# Patient Record
Sex: Female | Born: 1955 | Race: White | Hispanic: No | Marital: Married | Smoking: Current every day smoker
Health system: Southern US, Community
[De-identification: ages and names within clinical notes are randomized; demographics above are authoritative.]

## PROBLEM LIST (undated history)

## (undated) DIAGNOSIS — F32A Depression, unspecified: Secondary | ICD-10-CM

## (undated) DIAGNOSIS — M1990 Unspecified osteoarthritis, unspecified site: Secondary | ICD-10-CM

## (undated) DIAGNOSIS — L570 Actinic keratosis: Secondary | ICD-10-CM

## (undated) DIAGNOSIS — F419 Anxiety disorder, unspecified: Secondary | ICD-10-CM

## (undated) DIAGNOSIS — F329 Major depressive disorder, single episode, unspecified: Secondary | ICD-10-CM

## (undated) DIAGNOSIS — I10 Essential (primary) hypertension: Secondary | ICD-10-CM

## (undated) DIAGNOSIS — E785 Hyperlipidemia, unspecified: Secondary | ICD-10-CM

## (undated) DIAGNOSIS — N200 Calculus of kidney: Secondary | ICD-10-CM

## (undated) HISTORY — DX: Calculus of kidney: N20.0

## (undated) HISTORY — DX: Hyperlipidemia, unspecified: E78.5

## (undated) HISTORY — DX: Depression, unspecified: F32.A

## (undated) HISTORY — DX: Unspecified osteoarthritis, unspecified site: M19.90

## (undated) HISTORY — DX: Actinic keratosis: L57.0

## (undated) HISTORY — DX: Major depressive disorder, single episode, unspecified: F32.9

## (undated) HISTORY — PX: BREAST BIOPSY: SHX20

---
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
## 1988-01-13 HISTORY — PX: ABDOMINAL HYSTERECTOMY: SHX81

## 2004-04-11 ENCOUNTER — Ambulatory Visit: Payer: Self-pay

## 2004-04-12 ENCOUNTER — Emergency Department: Payer: Self-pay | Admitting: Emergency Medicine

## 2004-08-12 ENCOUNTER — Ambulatory Visit: Payer: Self-pay | Admitting: Endocrinology

## 2005-07-30 ENCOUNTER — Ambulatory Visit: Payer: Self-pay | Admitting: General Practice

## 2006-04-08 ENCOUNTER — Ambulatory Visit: Payer: Self-pay | Admitting: Gastroenterology

## 2006-08-03 ENCOUNTER — Ambulatory Visit: Payer: Self-pay | Admitting: Endocrinology

## 2006-12-08 ENCOUNTER — Ambulatory Visit: Payer: Self-pay | Admitting: Internal Medicine

## 2006-12-16 ENCOUNTER — Ambulatory Visit: Payer: Self-pay | Admitting: Internal Medicine

## 2007-04-19 ENCOUNTER — Ambulatory Visit: Payer: Self-pay | Admitting: Internal Medicine

## 2007-04-28 ENCOUNTER — Ambulatory Visit: Payer: Self-pay | Admitting: Family Medicine

## 2007-10-20 ENCOUNTER — Ambulatory Visit: Payer: Self-pay | Admitting: Internal Medicine

## 2007-11-20 ENCOUNTER — Emergency Department: Payer: Self-pay | Admitting: Internal Medicine

## 2008-11-08 ENCOUNTER — Ambulatory Visit: Payer: Self-pay | Admitting: Internal Medicine

## 2009-01-08 ENCOUNTER — Ambulatory Visit: Payer: Self-pay | Admitting: Obstetrics & Gynecology

## 2009-01-12 HISTORY — PX: BLADDER SURGERY: SHX569

## 2009-01-15 ENCOUNTER — Ambulatory Visit: Payer: Self-pay | Admitting: Obstetrics & Gynecology

## 2009-04-14 IMAGING — US US PELV - US TRANSVAGINAL
1 series · 17 of 25 positions shown · non-contrast
Comparison: none

REASON FOR EXAM: Follow-up cyst
COMMENTS:

[Series 1: us pelv - us transvaginal · 17 of 33 slices shown]
[im 1/33]
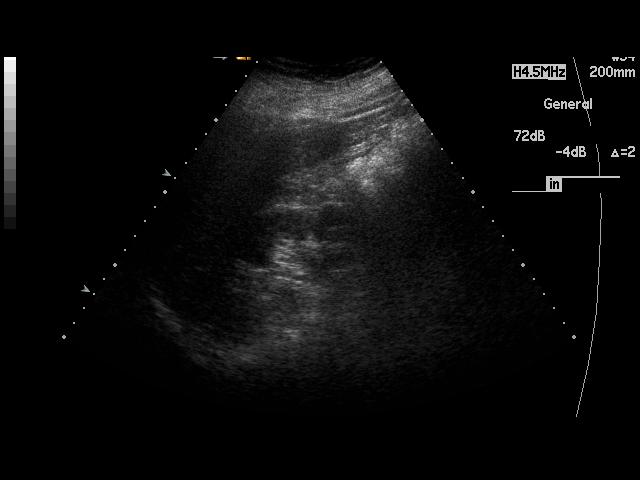
[im 3/33]
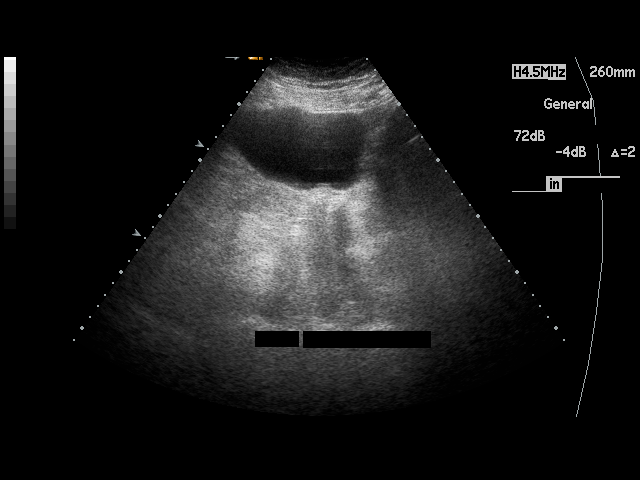
[im 5/33]
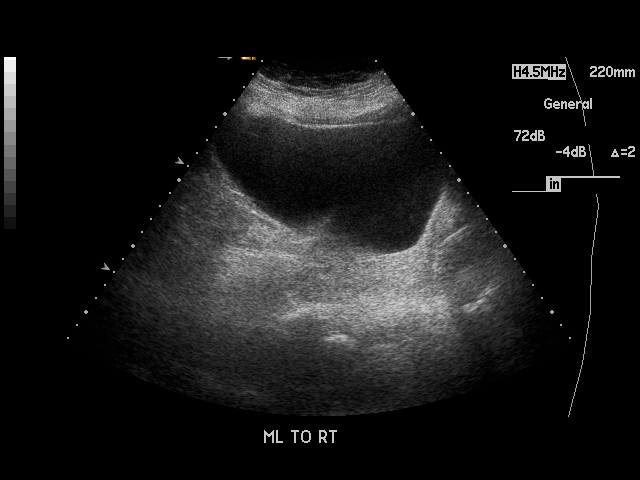
[im 7/33]
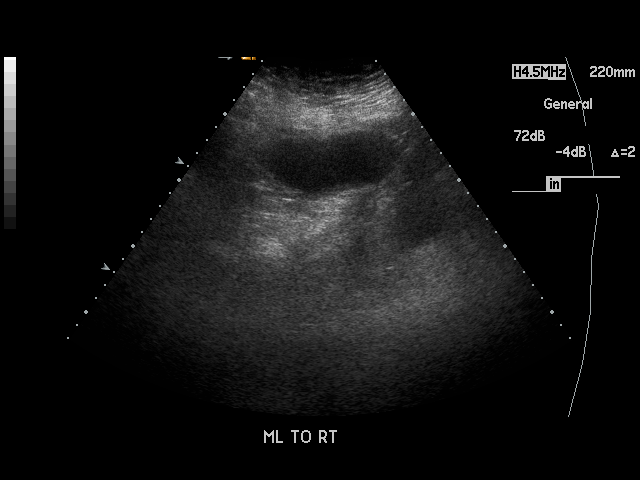
[im 9/33]
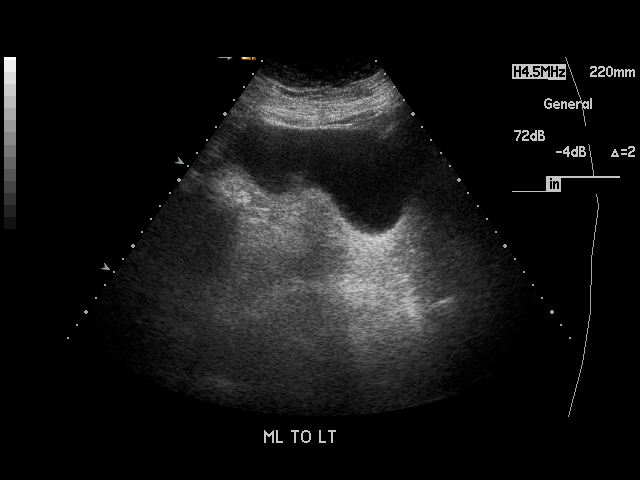
[im 11/33]
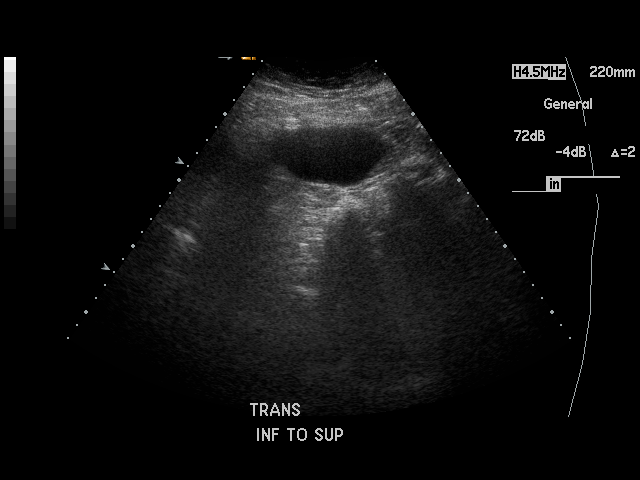
[im 13/33]
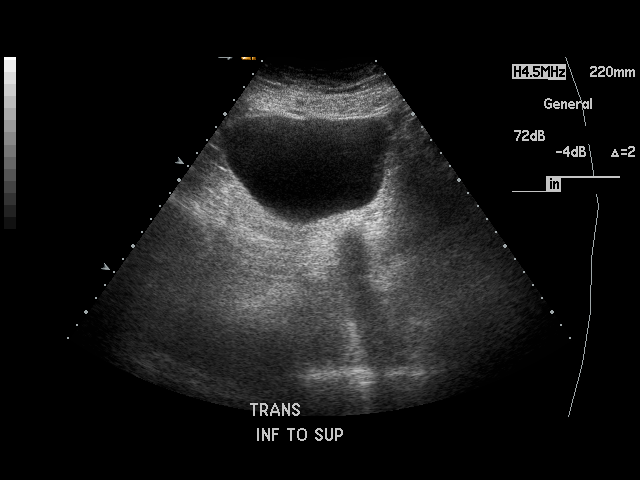
[im 15/33]
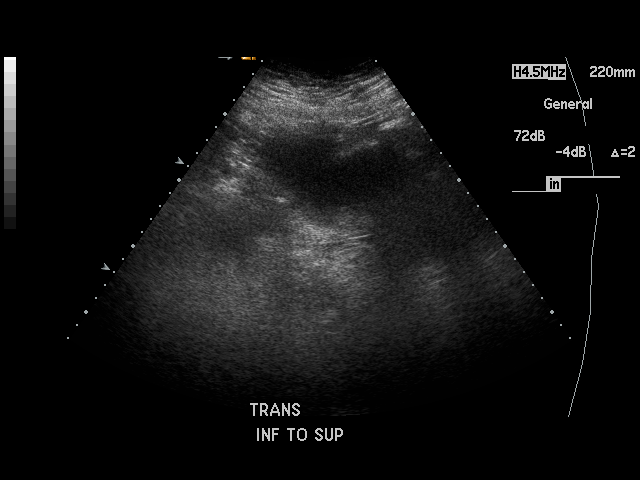
[im 17/33]
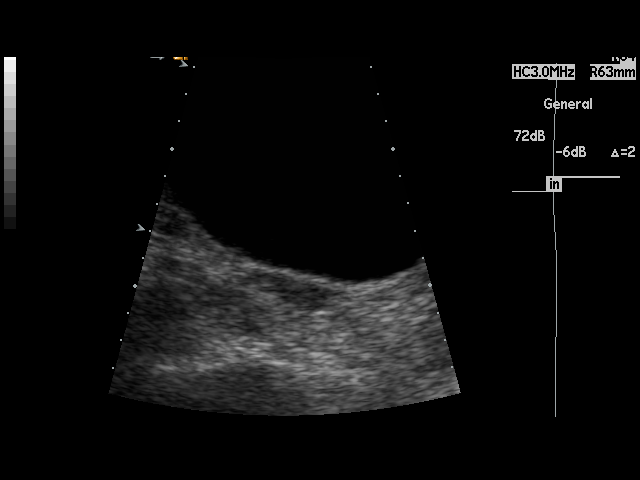
[im 18/33]
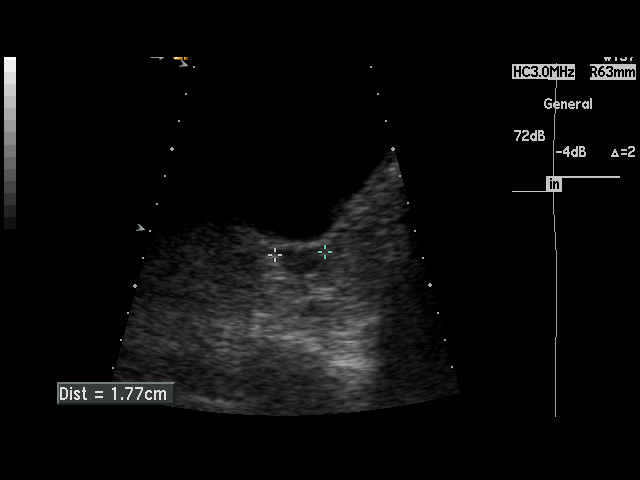
[im 21/33]
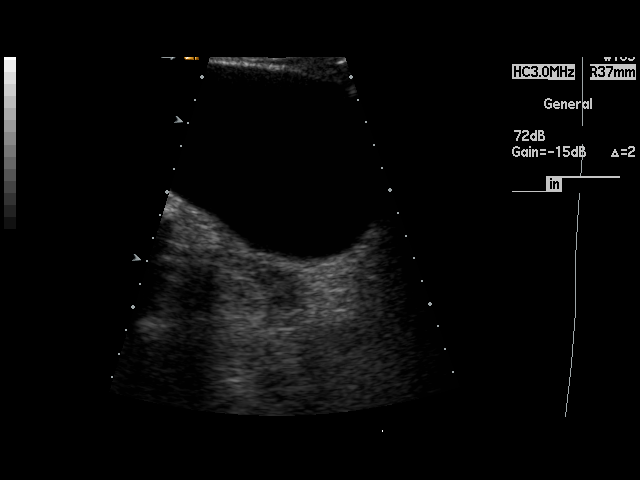
[im 22/33]
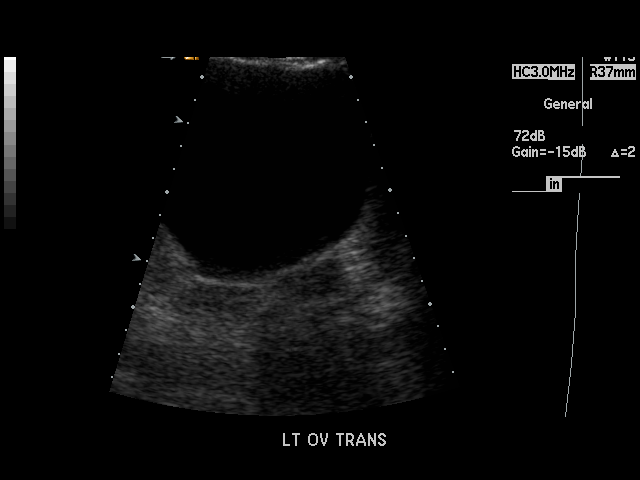
[im 25/33]
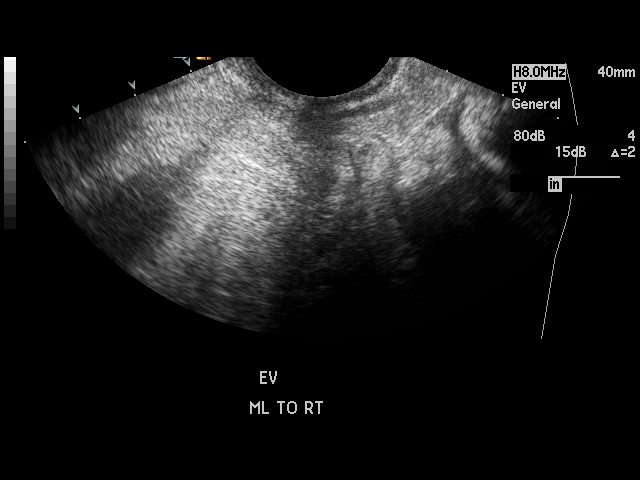
[im 26/33]
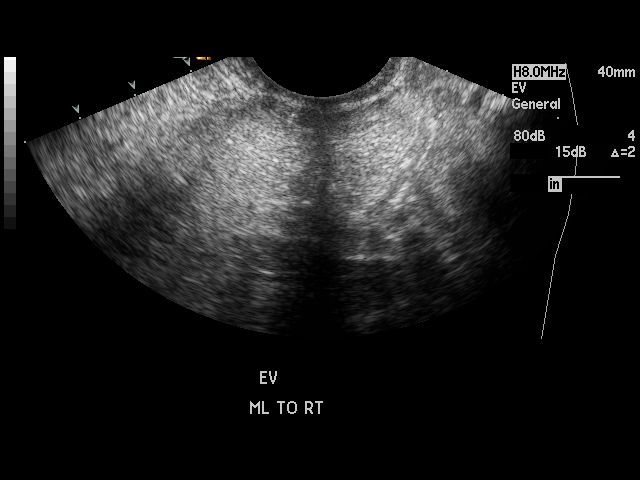
[im 29/33]
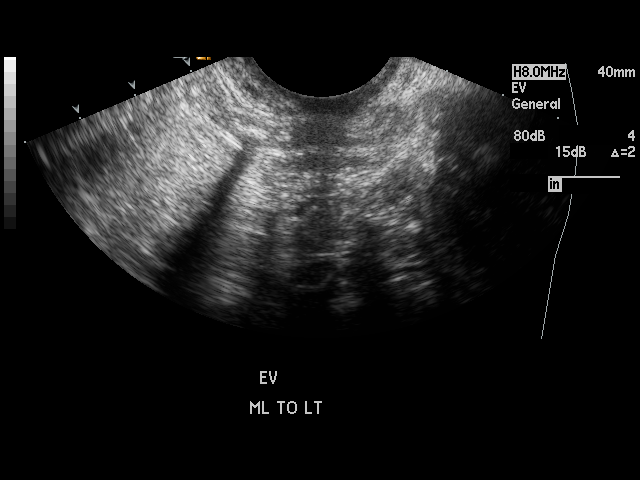
[im 30/33]
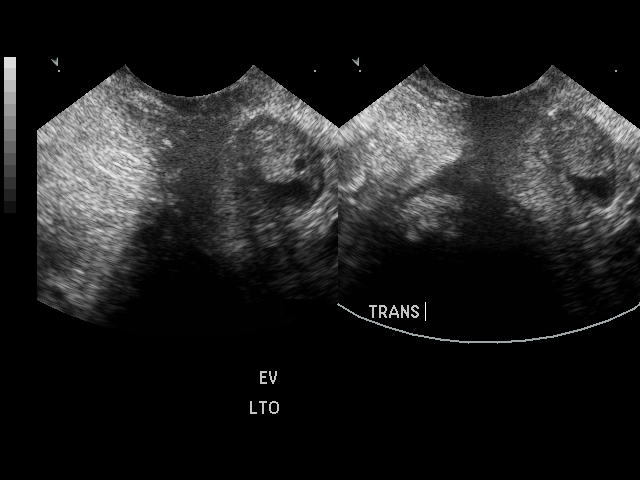
[im 33/33]
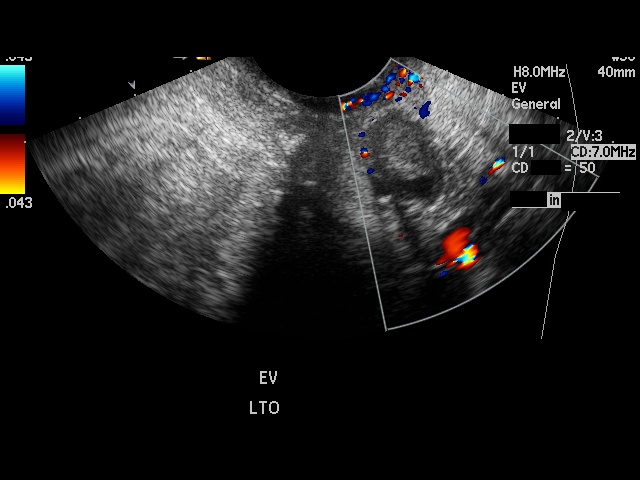

[17 of 25 positions shown; findings below may reference images not displayed]

PROCEDURE:     US  - US PELVIS MASS EXAM W/TRANSVAGI  - October 20, 2007  [DATE]

RESULT:     Transabdominal and endovaginal scanning is performed.

The patient is status post hysterectomy in 9959. The patient has undergone
previous ultrasound of the pelvis showing bilateral ovarian cysts.

On today's examination, the RIGHT ovary measures 2.2 x 1.4 x 1.8 cm while
the LEFT ovary measures 1.8 x 1.1 x 1.1 cm. The LEFT ovary contains a 3.6 x
3.8 x 3.0 mm cyst which is smaller than seen previously. There is no
hydronephrosis or abnormal fluid collection otherwise.
IMPRESSION: Tiny, LEFT ovarian cyst. This is continually decreasing in
size compared to the previous exam. The patient is status post hysterectomy.

## 2009-11-27 ENCOUNTER — Ambulatory Visit: Payer: Self-pay

## 2010-05-09 ENCOUNTER — Observation Stay (HOSPITAL_COMMUNITY)
Admission: RE | Admit: 2010-05-09 | Discharge: 2010-05-11 | Disposition: A | Discharge status: Home / Self Care | Payer: 59 | Source: Other Acute Inpatient Hospital | Attending: Internal Medicine | Admitting: Internal Medicine

## 2010-05-09 ENCOUNTER — Emergency Department: Payer: Self-pay | Admitting: Unknown Physician Specialty

## 2010-05-09 DIAGNOSIS — F3289 Other specified depressive episodes: Secondary | ICD-10-CM | POA: Insufficient documentation

## 2010-05-09 DIAGNOSIS — F17200 Nicotine dependence, unspecified, uncomplicated: Secondary | ICD-10-CM | POA: Insufficient documentation

## 2010-05-09 DIAGNOSIS — J309 Allergic rhinitis, unspecified: Secondary | ICD-10-CM | POA: Insufficient documentation

## 2010-05-09 DIAGNOSIS — N179 Acute kidney failure, unspecified: Secondary | ICD-10-CM | POA: Insufficient documentation

## 2010-05-09 DIAGNOSIS — E876 Hypokalemia: Secondary | ICD-10-CM | POA: Insufficient documentation

## 2010-05-09 DIAGNOSIS — J45909 Unspecified asthma, uncomplicated: Secondary | ICD-10-CM | POA: Insufficient documentation

## 2010-05-09 DIAGNOSIS — R509 Fever, unspecified: Secondary | ICD-10-CM | POA: Insufficient documentation

## 2010-05-09 DIAGNOSIS — K7689 Other specified diseases of liver: Secondary | ICD-10-CM | POA: Insufficient documentation

## 2010-05-09 DIAGNOSIS — N12 Tubulo-interstitial nephritis, not specified as acute or chronic: Principal | ICD-10-CM | POA: Insufficient documentation

## 2010-05-09 DIAGNOSIS — F329 Major depressive disorder, single episode, unspecified: Secondary | ICD-10-CM | POA: Insufficient documentation

## 2010-05-09 DIAGNOSIS — D72829 Elevated white blood cell count, unspecified: Secondary | ICD-10-CM | POA: Insufficient documentation

## 2010-05-09 DIAGNOSIS — D1803 Hemangioma of intra-abdominal structures: Secondary | ICD-10-CM | POA: Insufficient documentation

## 2010-05-09 DIAGNOSIS — Z79899 Other long term (current) drug therapy: Secondary | ICD-10-CM | POA: Insufficient documentation

## 2010-05-09 DIAGNOSIS — N3289 Other specified disorders of bladder: Secondary | ICD-10-CM | POA: Insufficient documentation

## 2010-05-09 DIAGNOSIS — I10 Essential (primary) hypertension: Secondary | ICD-10-CM | POA: Insufficient documentation

## 2010-05-09 LAB — URINALYSIS, ROUTINE W REFLEX MICROSCOPIC
Bilirubin Urine: NEGATIVE
Glucose, UA: NEGATIVE mg/dL
Protein, ur: 100 mg/dL — AB

## 2010-05-09 LAB — URINE MICROSCOPIC-ADD ON

## 2010-05-10 LAB — PROTIME-INR
INR: 1.19 (ref 0.00–1.49)
Prothrombin Time: 15.3 seconds — ABNORMAL HIGH (ref 11.6–15.2)

## 2010-05-10 LAB — COMPREHENSIVE METABOLIC PANEL
BUN: 12 mg/dL (ref 6–23)
Calcium: 8.6 mg/dL (ref 8.4–10.5)
Glucose, Bld: 145 mg/dL — ABNORMAL HIGH (ref 70–99)
Sodium: 138 mEq/L (ref 135–145)
Total Protein: 6.2 g/dL (ref 6.0–8.3)

## 2010-05-10 LAB — CBC
HCT: 34.7 % — ABNORMAL LOW (ref 36.0–46.0)
MCHC: 34 g/dL (ref 30.0–36.0)
MCV: 89.9 fL (ref 78.0–100.0)
RDW: 13.3 % (ref 11.5–15.5)

## 2010-05-10 LAB — MAGNESIUM: Magnesium: 2 mg/dL (ref 1.5–2.5)

## 2010-05-10 LAB — PROTEIN, URINE, RANDOM: Total Protein, Urine: 122 mg/dL

## 2010-05-10 NOTE — Consult Note (Signed)
NAMEURIEL, Amanda Mcgee                ACCOUNT NO.:  [id number]  MEDICAL RECORD NO.:  [id number]           PATIENT TYPE:  I  LOCATION:  1341                         FACILITY:  [facility]  PHYSICIAN:  Danae Chen, M.D.  DATE OF BIRTH:  1955-07-31  DATE OF CONSULTATION:  05/09/2010 DATE OF DISCHARGE:                                CONSULTATION   REASON FOR CONSULTATION:  Fever and right UPJ stone.  HISTORY:  The patient is a 55 year old female who started having chills and fever last Sunday.  That was associated with nausea, vomiting and mild diarrhea.  She was out of town and saw her primary care physician on Wednesday who thought that she had an episode of food poisoning and started her on Cipro and Flagyl.  However, she did not see any improvement and continued to have chills and fever and this morning she felt worse and went to the emergency room where a CT scan showed moderate right hydronephrosis  secondary to a 6 x 7 mm stone at the right UPJ.  She states that her temperature was up to 102.8.  She was seen at [facility] and she was transferred to [facility] because they do not have any urology coverage and I was asked to see her in consultation.  PAST MEDICAL HISTORY:  Positive for hypertension.  SOCIAL HISTORY:  She is married, has one child, has smoked 1/2 to 3/4th of a pack a day for 30 years and she drinks occasionally.  FAMILY HISTORY:  Her father died of lymphoma and her mother had recently diagnosed with a brain tumor.  PAST SURGICAL HISTORY:  She had hysterectomy, appendectomy, and sling procedure.  MEDICATIONS:  Bupropion 150 mg every 12 hours, calcium carbonate 600 mg a day, cetirizine 10 mg a day, citalopram 40 mg a day, Colace 100 mg 3 times a day, HCTZ 12.5 mg a day, ibuprofen 200 mg 2 tablets every 4 hours, metronidazole 500 mg twice a day, Cipro 500 mg, ondansetron 8 mg every 8 hours for nausea,  Singulair 10 mg a day, Tylenol Extra  Strength 500 mg 2 tablets every 4 hours, vitamin B12 1000 mcg a day, vitamin C 1000 mcg a day, vitamin E 1000 International Units daily.  ALLERGIES:  She is allergic to CODEINE, BENADRYL, PREDNISONE, PENICILLIN, LEVOFLOXACIN.  REVIEW OF SYSTEMS:  As noted in the HPI and everything else is negative.  PHYSICAL EXAMINATION:  GENERAL:  This is a well-developed 55 year old female who states that she feels terrible.  She has nausea, vomiting and right-sided abdominal pain and she has a poor appetite.  She has chills and fever.  She is oriented to time, place and person. VITAL SIGNS:  Her blood pressure at this time is 138/72, her temperature is down to 99.3, pulse 85, respirations 20. SKIN:  Warm and dry. HEENT:  Her head is normal.  She has pink conjunctivae.  Ears, nose and throat are within normal limits. NECK:  Supple.  She has no cervical adenopathy.  No thyromegaly. CHEST:  Symmetrical. LUNGS:  Her lungs are fully expanded and clear to percussion and auscultation.  HEART:  Regular rhythm. ABDOMEN:  Soft and she has some moderate right CVA tenderness.  Kidneys are not palpable.  She has no hepatomegaly, no splenomegaly.  Bladder is not distended.  She has no inguinal adenopathy.  No inguinal hernia. GENITALIA:  She has normal female genitalia.  She is status post hysterectomy.  RADIOLOGIC STUDIES:  I independently reviewed the CT scan and the findings are as noted above.  Her hemoglobin at [facility] was 11.4, hematocrit 34, WBC 7.8.  Sodium 137, potassium low at 2.9, BUN 15, creatinine 1.22.  IMPRESSION:  Right ureteropelvic junction stone, moderate right hydronephrosis, urinary tract infection.  Urine culture is pending.  SUGGESTIONS:  Continue IV Rocephin.  SUGGESTIONS:  Cystoscopy and stent placement to relieve obstruction. The procedure, the risks and benefits were explained to the patient.  The risks include but are not limited to hemorrhage, infection  and inability to insert the stent because of the stone and ureteral injury.  She understands and is agreeable.  If the stent cannot be placed, she will then need a right percutaneous nephrostomy for kidney drainage.     Danae Chen, M.D.     MN/MEDQ  D:  05/09/2010  T:  05/10/2010  Job:  [id number]  Electronically Signed by Lindaann Slough M.D. on 05/10/2010 11:17:18 AM

## 2010-05-10 NOTE — H&P (Signed)
NAME:  Amanda Mcgee, Amanda Mcgee                ACCOUNT NO.:  [id number]  MEDICAL RECORD NO.:  [id number]           PATIENT TYPE:  I  LOCATION:  1341                         FACILITY:  [facility]  PHYSICIAN:  Baltazar Najjar, MD     DATE OF BIRTH:  Jul 17, 1955  DATE OF ADMISSION:  05/09/2010 DATE OF DISCHARGE:                             HISTORY & PHYSICAL   This is a transfer from [facility].  PRIMARY CARE PHYSICIAN:  Beverely Risen, MD.  CODE STATUS:  Full code.  CHIEF COMPLAINT:  Nausea, vomiting, abdominal pain on and off since Sunday.  HISTORY OF PRESENT ILLNESS:  Amanda Mcgee is a 55 year old female with past medical history of hypertension, tobacco abuse, and obesity, was transferred to [place] from [facility] for urological evaluation and workup.  Apparently, the patient was found to have right ureteropelvic junction 60 x 7 mm stone with moderate right hydronephrosis and given the fact that they do not have urologycoverage, the patient was transferred to [facility]. As per the patient, since Sunday, she has been experiencing nausea, vomiting, fevers, and chills.  She was having also abdominal discomfort on both flanks. The patient was also complaining of diarrhea.  At that time, she was seen by her primary care physician who prescribed her Ciprofloxacin and Flagyl and stated that she probably have food poisoning or some kind of stomach infection.  The patient was unable to tolerate ciprofloxacin. She stated that she has allergic reaction to it manifested by swelling and rash, so she discontinued the ciprofloxacin, and her condition continued to worse.  As per her since Tuesday, she have not had any further diarrhea and no vomiting, however she continued to be nauseated and just feeling weak and having fevers and rigors, so she came back to the  ER.  In the ER as part of her workup, her urine test showed evidence of urinary tract infection.  She  also had a CT scan of the abdomen and pelvis that showed a right hydronephrosis with right ureteropelvic junction stone that was 6  x 7 mm.  Her right kidney size was also smaller than the left.  She has right atrophic kidney.  As per history and physical from [place], they called the nephrology service over there, who recommended transferring her to [facility].  The patient was also found to be febrile at [facility] with a temperature was 100.7.  She was given Rocephin, Zofran, and Dilaudid in the ER. The patient denies any dysuria or hematuria; however, she stated that she gets flank pains on and off.  PAST MEDICAL HISTORY: 1. Hypertension. 2. Obesity. 3. Tobacco abuse.  SOCIAL HISTORY:  She is married, lives with her husband, unemployed. Smokes cigarettes, however she stopped smoking since Sunday drinks about three times a week.  She used to smoke about half a pack a day for about 30 years.  FAMILY HISTORY:  Significant for hypertension and hyperlipidemia.  ALLERGIES: 1. PENICILLIN. 2. CODEINE. 3. BENADRYL. 4. CIPROFLOXACIN. 5. PREDNISONE.  REVIEW OF SYSTEMS:  CHEST:  No shortness of breath or cough. CARDIOVASCULAR: No chest pain or  palpitation.  ABDOMEN:  Significant for flanks pain, on and off nausea. Vomiting and diarrhea which was resolved.  GU: No dysuria, no increasing urinary frequency.  No hematuria.  CONSTITUTIONAL:  Significant for fever or rigors.  HOME MEDICATIONS: 1. Citalopram 40 mg daily at bedtime. 2. Hydrochlorothiazide 12.5 mg daily. 3. Ventolin inhaler 2 puffs q.4 h p.r.n. 4. Cetirizine 10 mg p.o. daily. 5. Singulair 10 mg p.o. q.p.m. 6. Ibuprofen 200 mg q.4 h p.r.n. 7. Tylenol Extra Strength 500 mg 2 tablets q. 4 h p.r.n. 8. Wellbutrin SR 150 mg p.o. 3 times a day.  PHYSICAL EXAM:  VITAL SIGNS:  Blood pressure 138/72, pulse 85, temperature 99.3, O2 sat 93% on room air.  She is alert, in mild distress. NECK:  Supple.  No JVD. CHEST:  Clear to  auscultation bilaterally. CARDIOVASCULAR:  S1, S2 regular rhythm and rate. ABDOMEN: Soft and nontender.  She does have costovertebral angle tenderness.  However, there is no frontal tenderness.  Bowel sounds positive. EXTREMITIES:  No pedal edema. NEUROLOGIC:  She is alert, oriented x3 and moves all her extremities.  LABORATORY DATA:  As per [facility] history and physical, white blood count of 7.8, hemoglobin 11.4, hematocrit 34, platelet 204. Sodium 137, potassium 2.9, bicarb 30, BUN 15, creatinine 1.22, glucose 125.  Urinalysis showed 11 WBCs, 1+ bacteria, trace leukocyte esterase, 100 mg of protein, and 3+ blood.  CT/RADIOLOGY/IMAGING:  As per [facility] records,  CT scan of the abdomen and pelvis showed moderate hydronephrosis and hydroureter on the right secondary to 6 x 7 mm stone in the right ureteropelvic junction, no other stones.  Abdominal sonogram showed mild splenomegaly, mild hydronephrosis involving the right kidney, echogenic nodules within the liver, possibly hemangioma; however, they recommended further imaging such as CT and MRI.  ASSESSMENT/PLAN: 1. Urinary tract infection/pyelonephritis. 2. Right hydronephrosis/right ureteropelvic junction stone. 3. Atrophic right kidney. 4. Hypokalemia. 5. Liver hemangioma, nodule. 6. Possibly acute kidney injury, unknown baseline creatinine. 7. Hypertension. 8. Tobacco abuse.  PLAN: 1. We will send blood and urine culture and start her on Rocephin 1     gram IV daily.  Follow cultures and tailor antibiotic accordingly. 2. Dr. Brunilda Payor from Urology consulted who will see the patient in the     morning. 3. IV fluid hydration with normal saline at 20 mEq of KCl, also will     give KCl 10 mEq IV x4 runs, check magnesium level in the a.m. 4. Will keep her on clear liquid diet for now and prescribed Zofran     p.r.n. for nausea. 5. Nicotine patch, smoking cessation counseling.  The patient was     counseled on  smoking cessation by me, and she will also be     considered for social work resources for outpatient smoking     cessation programs. 6. I will hold the hydrochlorothiazide given the fact that her     potassium is currently low and she looked dry, I will switch that     with Norvasc. 7. Her urine showed significant proteinuria, repeat urinalysis in the     ER, and if she still has significant proteinuria, I will recommend     to check spot urine for protein, creatinine to quantify her     proteinuria. 8. DVT and GI prophylaxis. 9. The patient is a full code.          ______________________________ Baltazar Najjar, MD     SA/MEDQ  D:  05/09/2010  T:  05/09/2010  Job:  [id number]  Electronically Signed by Hannah Beat MD on 05/10/2010 09:01:21 PM

## 2010-05-11 LAB — BASIC METABOLIC PANEL
BUN: 13 mg/dL (ref 6–23)
Creatinine, Ser: 0.97 mg/dL (ref 0.4–1.2)
GFR calc non Af Amer: 60 mL/min — ABNORMAL LOW (ref >60)

## 2010-05-11 LAB — URINE CULTURE
Colony Count: NO GROWTH
Culture  Setup Time: 201204280111
Special Requests: NEGATIVE

## 2010-05-11 LAB — CBC
MCH: 30.5 pg (ref 26.0–34.0)
MCV: 90.9 fL (ref 78.0–100.0)
Platelets: 181 10*3/uL (ref 150–400)
RDW: 13.6 % (ref 11.5–15.5)

## 2010-05-11 LAB — MAGNESIUM: Magnesium: 2 mg/dL (ref 1.5–2.5)

## 2010-05-12 LAB — URINE CULTURE

## 2010-05-16 LAB — CULTURE, BLOOD (ROUTINE X 2): Culture  Setup Time: 201204280527

## 2010-05-16 NOTE — Op Note (Signed)
  NAME:  Amanda Mcgee, Amanda Mcgee                ACCOUNT NO.:  [id number]  MEDICAL RECORD NO.:  [id number]           PATIENT TYPE:  I  LOCATION:  1341                         FACILITY:  [facility]  PHYSICIAN:  Danae Chen, M.D.  DATE OF BIRTH:  [birth year]  DATE OF PROCEDURE:  05/10/2010 DATE OF DISCHARGE:                              OPERATIVE REPORT   PREOPERATIVE DIAGNOSES:  Right ureteropelvic junction stone, urinary tract infection, right hydronephrosis.  POSTOPERATIVE DIAGNOSES:  Right ureteropelvic junction stone, urinary tract infection, right hydronephrosis.  PROCEDURE DONE:  Cystoscopy, right retrograde pyelogram and insertion of double-J stent.  SURGEON:  Danae Chen, M.D.  ANESTHESIA:  General.  INDICATIONS:  The patient is a 55 year old female who has been having chills, fever, nausea, vomiting and diarrhea for the past 6 days.  She went to see her primary care physician on Wednesday who started her on Cipro and Flagyl.  She did not see any improvement and went to the emergency room at [facility] yesterday.  She claims that her temperature was up to 102.8.  A CT scan of the abdomen and pelvis showed a 6 x 7 mm stone at the right UPJ with moderate hydronephrosis.  They do not have a urologist on call in [place] and she was then transferred to [facility] for further treatment.  She is still complaining of chills, fever and right-sided abdominal pain.  She is scheduled for cystoscopy, right retrograde pyelogram and double-J stent.  PROCEDURE IN DETAIL:  The patient was identified by her wrist band and proper time-out was taken.  Under general anesthesia she was prepped and draped and placed in the dorsal lithotomy position.  A panendoscope was inserted in the bladder. The urethra is normal.  The bladder mucosa is normal.  There is no stone or tumor in the bladder.  Retrograde pyelogram.  A #6-French open-ended catheter was passed through the cystoscope and  through the ureteral orifice.  Contrast was then injected through the open-ended catheter.  The distal and mid ureter appear normal.  There is a filling defect at the right UPJ and the renal pelvis and calyces are moderately dilated.  A sensor wire was passed through the open-ended catheter up to the renal pelvis.  The open-ended catheter was then removed.  A #6-French - 26 double-J stent was passed over the guidewire with the proximal curl and the distal curl respectively in the renal pelvis and in the bladder. Purulent urine drained from the stent.  Then the urine was sent for culture and sensitivity.  The guidewire was removed.  The bladder was emptied and the cystoscope removed.  The patient tolerated the procedure well and left the OR in satisfactory condition to the Post Anesthesia Care Unit.     Danae Chen, M.D.     MN/MEDQ  D:  05/10/2010  T:  05/10/2010  Job:  [id number]  Electronically Signed by Lindaann Slough M.D. on 05/16/2010 03:15:50 PM

## 2010-05-19 NOTE — Discharge Summary (Signed)
NAME:  Amanda Mcgee, Amanda Mcgee                ACCOUNT NO.:  [id number]  MEDICAL RECORD NO.:  [id number]           PATIENT TYPE:  I  LOCATION:  1341                         FACILITY:  [facility]  PHYSICIAN:  Rosanna Randy, MDDATE OF BIRTH:  [birth year]  DATE OF ADMISSION:  05/09/2010 DATE OF DISCHARGE:  05/11/2010                              DISCHARGE SUMMARY   PRIMARY CARE PHYSICIAN:  Dr.  Beverely Risen  DISCHARGE DIAGNOSES: 1. Urinary tract infection/pyelonephritis. 2. Right hydronephrosis/right ureteropelvic junction stone. 3. Hypokalemia. 4. Acute kidney injury. 5. Hypertension. 6. Depression. 7. Tobacco abuse. 8. Liver hemangioma/echogenic nodules within the liver. 9. Allergic rhinitis. 10.Asthma. 11.Bladder spasm, new symptom after JJ stent was placed and     cystourethrogram was performed. 12.Leukocytosis.  DISCHARGE MEDICATIONS: 1. Bactrim DS 1 tablet by mouth twice a day and ordered for the     patient to take it for 8 more days. 2. Hyoscyamine 0.125 mg by mouth every 4 hours as needed for bladder     spasm. 3. Potassium 20 mEq to take by mouth daily. 4. Vicodin 5/500 one to two tablets by mouth every 6 hours as needed     for severe pain.  No relieve by plain Tylenol. 5. Tylenol Extra Strength 500 mg 2 tablets by mouth q.6 h as needed     for fever/pain. 6. Cetrizine 10 mg 1 tablet by mouth every morning. 7. Citalopram 40 mg 1 tablet by mouth daily at bedtime. 8. HCTZ 12.5 mg 1 tablet by mouth daily. 9. Montelukast 10 mg 1 tablet by mouth every morning. 10.Albuterol inhaler 2 puffs every 4 hours as needed to be inhaled for     shortness of breath/chest tightness. 11.Wellbutrin SR 1 tablet by mouth 3 times a day.  The patient was     advised to stop using ibuprofen.  DISPOSITION AND FOLLOWUP:  The patient has been discharged in stable and improved condition, currently afebrile for the last 24 hours, not complaining of any nausea and vomiting which just mild discomfort  when she urinates, described per her as bladder spasm and with instructions to arrange followup appointment with primary care physician in 7 to 10 days and to call Dr. Brunilda Payor, urology office, in order to arrange followup with him for ESL of stone as an outpatient when the urine is sterile. The patient was also instructed to follow a heart healthy diet and to stop smoking.  PROCEDURES PERFORMED DURING THIS HOSPITALIZATION:  Cystoscopy with right retrograde pyelogram insertion of double-J stent by Dr. Brunilda Payor.  No other procedures were performed during this hospitalization.  Urology, specifically Dr. Brunilda Payor, was consulted.  HISTORY OF PRESENT ILLNESS:  For full details, please refer to Dr. Midge Aver HPI done on May 09, 2010, but in summary, this is a 55 year old female who started having chills and fever last Sunday prior to admission associated with nausea, vomiting and diarrhea.  She was out of town and saw her primary care physician on Wednesday who felt that she had an episode of food poisoning and started the patient on Cipro andFlagyl.  However, she did not see any improvement and  continued to have chills and fever.  The patient developed allergic reaction to ciprofloxacin and was continued just on metronidazole.  Due to failed improvement in her symptoms, the patient went to the emergency department at [facility] where a CT scan showed a moderate right hydronephrosis secondary to a 6 x 7 mm stone at the right UPJ.  She stated that her temperature was up to 103 and at that moment, the patient was transferred to [facility] because they did not have any Urology coverage.  The patient was started on Rocephin and a urology consultation was made.  PHYSICAL EXAMINATION:  VITAL SIGNS:  Physical exam demonstrated a blood pressure of 138/72, heart rate 85, temperature 99.3, oxygen saturation 93% on room air. GENERAL:  The patient was alert, in mild distress secondary to pain. NECK:   Supple.  No JVD. CHEST:  Clear to auscultation bilaterally. CARDIOVASCULAR:  S1, S2 with regular rate and rhythm. ABDOMEN:  Soft, nontender.  She had positive CVA angle tenderness, right more than left.  Positive bowel sounds. EXTREMITIES:  No pedal edema. NEUROLOGIC EXAM:  Cranial nerves II through XII intact.  Able to move 4 extremities without difficulty.  Muscle strength 5/5.  The patient follows commands appropriately and has normal sensation.  LABORATORY DATA:  Pertinent laboratory data on admission after reviewing [facility] history and physical, the patient had white blood cell of 7.8, hemoglobin 11.4, hematocrit 34, platelets 204.  Sodium 137, potassium 2.9, bicarb 30, BUN 15, creatinine 1.22, glucose 125. Urinalysis showed 11 white blood cells, 1+ bacteria, trace leukocyte esterase, 100 mg of protein, and 3+ blood.  CT radiology imaging reports; as per [facility] records, a CT scan of abdomen and pelvis showed moderate hydronephrosis and hydroureter on the right secondary to 6 x 7 mm stone in the right ureteropelvic junction.  No other stones were appreciated on the CT scan.  Abdominal sonogram showed mild splenomegaly, mild hydronephrosis involving the right kidney, echogenic nodules within the liver, possible hemangioma, however, radiologist has recommended further imaging studies such an MRI of the liver for correlation and better view.  Other pertinent laboratory data performed during this hospitalization includes PT 15.3, INR 1.19, PTT 37, random urine creatinine 82, total protein random in urine 122.  The patient had a CBC on May 10, 2010, that demonstrated white blood cells of 12.9 with a hemoglobin of 11.8, platelets 188.  Comprehensive metabolic panel done on May 10, 2010, demonstrated a sodium of 138, potassium 3.5, chloride 98, bicarb 29, blood sugar 145, BUN 12, creatinine 1.19.  This is after the patient received fluid resuscitation.   Alkaline phosphatase was 156, AST was 22, ALT 35, albumin 2.6, total protein 6.2, magnesium was 2.0.  Blood culture, no growth up to the moment of discharge.  Urine culture taken at the moment that the stent was placed was still pending, at the moment of this dictation, and is going to be followed by Dr. Brunilda Payor, urologist.  HOSPITAL COURSE BY PROBLEM.: 1. Pyelonephritis.  Final urine culture still pending but the patient     has been afebrile for the last 24 hours, able to tolerate full diet     and is not complaining of any nausea, vomiting or abdominal pain at     this point.  Plan is to start the patient on Bactrim twice a day     and she is going to follow with primary care physician and also     with Dr. Brunilda Payor as an  outpatient.  The patient is in charge of     arranging these  arranging these followup visits. 2. Right hydronephrosis secondary to a 6 x 7 mm stone localized in the     right UPJ.  The patient is status post JJ stent placement by Dr.     Brunilda Payor and is going to follow with him as an outpatient.  She is     going to continue taking antibiotic as instructed and has been     instructed to keep herself hydrated. 3. Bladder spasms that she has developed after the procedure and stent     placement.  She has been started on p.r.n. hyoscyamine and she is     going to follow with Urology. 4. Hypertension, stable.  Plan is to continue HCTZ and to follow with     her primary care physician.  The patient instructed to follow a low-     sodium diet. 5. Depression which is stable.  Plan is to continue using her Celexa. 6. Hypokalemia.  After repletion, the patient remained borderline low     potassium on the lab work prior to discharge, she had been started     on daily potassium supplementation.  This hypokalemia is most     likely secondary to the use of HCTZ. 7. Tobacco abuse.  She received counseling in order to stop smoking.     The patient is planning to quit.  She will need  further assistance     and support per primary care physician.  Throughout this     hospitalization, she was using nicotine patch. 8. Allergic rhinitis.  Plan is to continue using cetirizine. 9. History of asthma.  Plan is to continue using p.r.n. albuterol and     also Singulair. 10.Abnormal findings on her liver by ultrasound/CT done at [facility] that most likely is secondary to a hemangioma.  The     patient with completely normal LFTs throughout this     hospitalization.  We have recommended that the patient has an MRI     as an outpatient that can be arranged/scheduled by her primary care     physician. 11.Acute kidney injury secondary to infection and also obstruction     which completely resolved after fluid resuscitation, antibiotics,     and stent placement.  The rest of the patient's medical problems continued to be stable throughout this hospitalization and no changes have been made to her medication regimen.  DISCHARGE PHYSICAL EXAMINATION:  VITAL SIGNS:  At discharge, the patient's temperature was 98.6, heart rate 80, respiratory rate 18, blood pressure 124/70, oxygen saturation 96% on room air. GENERAL:  In general, the patient was in no acute distress. RESPIRATORY SYSTEM:  Clear to auscultation bilaterally. HEART:  Regular rate and rhythm without murmurs, gallops or rubs. ABDOMEN:  Soft, nontender, nondistended with positive bowel sounds. EXTREMITIES:  Without any edema. NEUROLOGIC EXAM:  Nonfocal.  DISCHARGE LABORATORY DATA:  At discharge, lab work demonstrated a phosphorous of 2.5, magnesium 2.0.  Sodium 134, potassium 3.4, chloride 102, bicarb 27, BUN 13, creatinine 0.97, blood sugar 116.  White blood cells 10.7, hemoglobin 10.5, platelets 181.     Rosanna Randy, MD     CEM/MEDQ  D:  05/11/2010  T:  05/12/2010  Job:  [id number]  cc:   Lindaann Slough, M.D. Fax: [phone]  Dr. Beverely Risen  Electronically Signed by Vassie Loll MD on  05/19/2010 10:09:18 PM

## 2010-06-12 ENCOUNTER — Ambulatory Visit (HOSPITAL_COMMUNITY)
Admission: RE | Admit: 2010-06-12 | Discharge: 2010-06-12 | Disposition: A | Discharge status: Home / Self Care | Payer: BC Managed Care – PPO | Source: Ambulatory Visit | Attending: Urology | Admitting: Urology

## 2010-06-12 DIAGNOSIS — F329 Major depressive disorder, single episode, unspecified: Secondary | ICD-10-CM | POA: Insufficient documentation

## 2010-06-12 DIAGNOSIS — F3289 Other specified depressive episodes: Secondary | ICD-10-CM | POA: Insufficient documentation

## 2010-06-12 DIAGNOSIS — F17200 Nicotine dependence, unspecified, uncomplicated: Secondary | ICD-10-CM | POA: Insufficient documentation

## 2010-06-12 DIAGNOSIS — N201 Calculus of ureter: Secondary | ICD-10-CM | POA: Insufficient documentation

## 2010-12-29 ENCOUNTER — Ambulatory Visit: Payer: Self-pay

## 2011-01-13 DIAGNOSIS — N200 Calculus of kidney: Secondary | ICD-10-CM

## 2011-01-13 HISTORY — DX: Calculus of kidney: N20.0

## 2011-06-13 HISTORY — PX: LITHOTRIPSY: SUR834

## 2011-08-06 ENCOUNTER — Ambulatory Visit: Payer: Self-pay | Admitting: Internal Medicine

## 2011-08-06 ENCOUNTER — Observation Stay: Payer: Self-pay | Admitting: Urology

## 2011-08-06 LAB — COMPREHENSIVE METABOLIC PANEL
Anion Gap: 10 (ref 7–16)
BUN: 11 mg/dL (ref 7–18)
Bilirubin,Total: 1 mg/dL (ref 0.2–1.0)
Chloride: 101 mmol/L (ref 98–107)
Co2: 26 mmol/L (ref 21–32)
Creatinine: 0.94 mg/dL (ref 0.60–1.30)
EGFR (African American): >60
Potassium: 3.5 mmol/L (ref 3.5–5.1)
SGOT(AST): 12 U/L — ABNORMAL LOW (ref 15–37)
Total Protein: 7.6 g/dL (ref 6.4–8.2)

## 2011-08-06 LAB — URINALYSIS, COMPLETE
Nitrite: NEGATIVE
Ph: 7 (ref 4.5–8.0)
Squamous Epithelial: 1

## 2011-08-06 LAB — CBC
MCV: 90 fL (ref 80–100)
Platelet: 309 10*3/uL (ref 150–440)
RDW: 13.5 % (ref 11.5–14.5)
WBC: 20.4 10*3/uL — ABNORMAL HIGH (ref 3.6–11.0)

## 2011-08-08 ENCOUNTER — Ambulatory Visit: Payer: Self-pay | Admitting: Urology

## 2011-08-08 LAB — CBC
HCT: 30.3 % — ABNORMAL LOW (ref 35.0–47.0)
MCH: 31 pg (ref 26.0–34.0)
MCHC: 34.7 g/dL (ref 32.0–36.0)
Platelet: 270 10*3/uL (ref 150–440)
WBC: 7.8 10*3/uL (ref 3.6–11.0)

## 2011-08-08 LAB — URINE CULTURE

## 2011-08-12 LAB — CULTURE, BLOOD (SINGLE)

## 2011-08-13 ENCOUNTER — Ambulatory Visit: Payer: Self-pay | Admitting: Urology

## 2011-08-31 ENCOUNTER — Ambulatory Visit: Payer: Self-pay | Admitting: Urology

## 2011-12-30 ENCOUNTER — Ambulatory Visit: Payer: Self-pay

## 2012-01-01 ENCOUNTER — Ambulatory Visit: Payer: Self-pay

## 2012-01-18 ENCOUNTER — Ambulatory Visit: Payer: Self-pay | Admitting: General Surgery

## 2012-07-26 ENCOUNTER — Ambulatory Visit: Payer: Self-pay | Admitting: General Surgery

## 2012-07-27 ENCOUNTER — Encounter: Payer: Self-pay | Admitting: General Surgery

## 2012-07-28 ENCOUNTER — Encounter: Payer: Self-pay | Admitting: *Deleted

## 2012-08-08 ENCOUNTER — Encounter: Payer: Self-pay | Admitting: General Surgery

## 2012-08-08 ENCOUNTER — Ambulatory Visit (INDEPENDENT_AMBULATORY_CARE_PROVIDER_SITE_OTHER): Payer: BC Managed Care – PPO | Admitting: General Surgery

## 2012-08-08 VITALS — BP 152/70 | HR 84 | Resp 12 | Ht 65.0 in | Wt 181.0 lb

## 2012-08-08 DIAGNOSIS — R928 Other abnormal and inconclusive findings on diagnostic imaging of breast: Secondary | ICD-10-CM

## 2012-08-08 NOTE — Patient Instructions (Signed)
Patient to return as needed. 

## 2012-08-08 NOTE — Progress Notes (Signed)
Patient ID: Amanda Mcgee, female   DOB: 12/22/1955, 57 y.o.   MRN: [id number]  Chief Complaint  Patient presents with  . Other    mammogram    HPI Amanda Mcgee is a 57 y.o. female who presents for a breast evaluation. The most recent mammogram was done on 07/26/12 cat 2 on the left breast. Patient does perform regular self breast checks and gets regular mammograms done.    HPI  Past Medical History  Diagnosis Date  . Arthritis   . Depression   . Hyperlipidemia   . Kidney stones 2013    Past Surgical History  Procedure Laterality Date  . Abdominal hysterectomy  1990  . Bladder surgery  2011  . Lithotripsy  06/2011  . Appendectomy  1969    Family History  Problem Relation Age of Onset  . Breast cancer Mother 36    Social History History  Substance Use Topics  . Smoking status: Current Every Day Smoker -- 0.50 packs/day for 30 years    Types: Cigarettes  . Smokeless tobacco: Never Used  . Alcohol Use: Yes    Allergies  Allergen Reactions  . Benadryl (Diphenhydramine Hcl) Swelling  . Codeine Swelling  . Penicillins     Upset stomach     Current Outpatient Prescriptions  Medication Sig Dispense Refill  . atorvastatin (LIPITOR) 10 MG tablet Take 1 tablet by mouth daily.      . Black Cohosh 160 MG CAPS Take 1 capsule by mouth daily.      Marland Kitchen buPROPion (WELLBUTRIN SR) 200 MG 12 hr tablet Take 1 tablet by mouth 2 (two) times daily.      . Calcium Carbonate-Vitamin D (CALCIUM + D PO) Take 1 tablet by mouth daily.      . citalopram (CELEXA) 40 MG tablet Take 1 tablet by mouth daily.      Marland Kitchen levocetirizine (XYZAL) 5 MG tablet Take 1 tablet by mouth daily.      Marland Kitchen PREVIDENT 5000 SENSITIVE 1.1-5 % PSTE Place 1 spray into the nose daily.      Bernadette Hoit Sodium (STOOL SOFTENER & LAXATIVE PO) Take 1 tablet by mouth 2 (two) times daily.       No current facility-administered medications for this visit.    Review of Systems Review of Systems  Constitutional:  Negative.   Respiratory: Negative.   Cardiovascular: Negative.     Blood pressure 152/70, pulse 84, resp. rate 12, height 5\' 5"  (1.651 m), weight 181 lb (82.101 kg), SpO2 97.00%.  Physical Exam Physical Exam  Constitutional: She is oriented to person, place, and time. She appears well-developed and well-nourished.  Cardiovascular: Normal rate, regular rhythm and normal heart sounds.   Pulmonary/Chest: She has rhonchi in the right lower field and the left lower field. Right breast exhibits no inverted nipple, no mass, no nipple discharge, no skin change and no tenderness. Left breast exhibits no inverted nipple, no mass, no nipple discharge, no skin change and no tenderness.  Course rhonchi were noted on the left, which cleared with deep inspiration. Mild prolongation of expiratory phase was noted. Resting room air saturation 97%.  Lymphadenopathy:    She has no cervical adenopathy.    She has no axillary adenopathy.  Neurological: She is alert and oriented to person, place, and time.  Skin: Skin is warm and dry.    Data Reviewed July 26, 2012 left breast mammogram showed the previously identified microcalcifications removed. BI-RAD-2.  Left breast biopsy dated January 18, 2012 showed benign breast tissue with focal fibrocystic changes and microcalcifications. Acute ductal inflammation was identified. No evidence of malignancy.  Assessment    Benign breast exam.     Plan    The patient should resume annual bilateral screening mammograms in December 2013 with her primary care provider.  The importance of smoking cessation was reviewed.        Amanda Mcgee 08/09/2012, 6:18 AM

## 2012-08-09 ENCOUNTER — Encounter: Payer: Self-pay | Admitting: General Surgery

## 2012-08-09 DIAGNOSIS — R928 Other abnormal and inconclusive findings on diagnostic imaging of breast: Secondary | ICD-10-CM | POA: Insufficient documentation

## 2012-08-16 ENCOUNTER — Encounter: Payer: Self-pay | Admitting: General Surgery

## 2012-12-30 ENCOUNTER — Ambulatory Visit: Payer: Self-pay

## 2013-07-13 IMAGING — CR DG OUTSIDE FILMS BODY
1 series · 1 of 1 positions shown · non-contrast
Comparison: none

[CC]
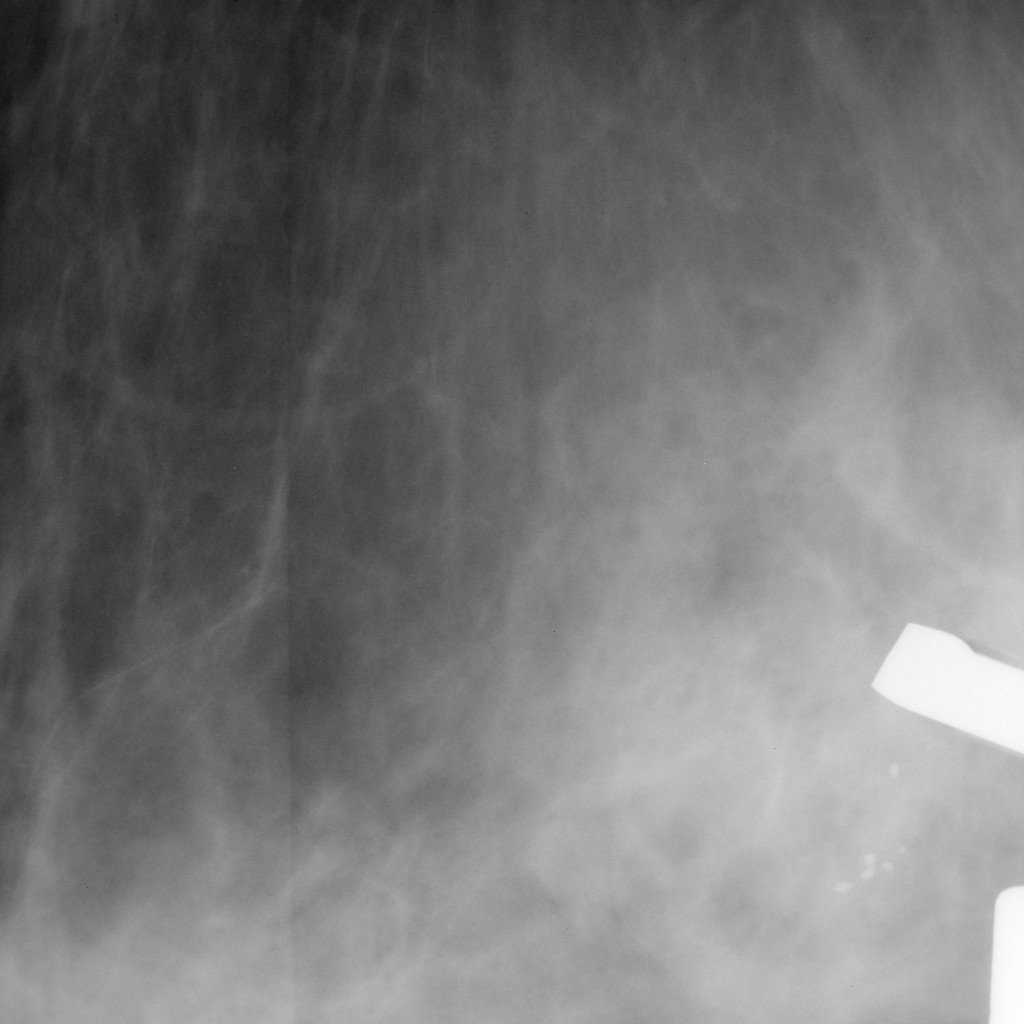

[1 of 1 positions shown; findings below may reference images not displayed]

Canned report from images found in remote index.

Refer to host system for actual result text.

## 2013-11-13 ENCOUNTER — Encounter: Payer: Self-pay | Admitting: General Surgery

## 2014-05-01 NOTE — H&P (Signed)
PATIENT NAME:  Amanda Mcgee, Amanda Mcgee MR#:  [id number] DATE OF BIRTH:  1955-01-31  DATE OF ADMISSION:  08/06/2011  CHIEF COMPLAINT: Flank pain.   HISTORY OF PRESENT ILLNESS: Amanda Mcgee is a 59 year old white female with a four month history of right flank pain. Pain became severe in the last week and she had a CT scan today indicating obstructed right collecting system with a distal stone. She was treated with a course of Macrodantin early in June followed by a course of Septra. She is currently on a course of Macrodantin. She presumably had UTIs in the past couple of months but rather it turns out to be a stone based upon the CT findings. She was seen in the ER earlier today and found to have probable urinary sepsis with a white cell count of 20,400 and fever.   ALLERGIES: The patient is allergic to Benadryl, penicillin, Claritin, and codeine.   CURRENT MEDICATIONS:  1. Cetirizine. 2. Bupropion. 3. Citalopram. 4. Atorvastatin. 5. Vitamins.   PAST SURGICAL HISTORY:  1. Appendectomy in 1968. 2. Hysterectomy in 1989.  3. Pubovaginal sling in 2011. 4. Cysto stent placement followed by lithotripsy April 2012.   SOCIAL HISTORY: The patient smokes a pack a day, has a greater than 30-pack-year history. She denied alcohol use.   FAMILY HISTORY: Remarkable for hypertension.   PAST AND CURRENT MEDICAL CONDITIONS:  1. Hypercholesterolemia.  2. Depression.  3. Allergic rhinitis.   REVIEW OF SYSTEMS: The patient denied chest pain, shortness of breath, diabetes, stroke, or coronary artery disease.   PHYSICAL EXAMINATION:   GENERAL: Well nourished white female in some distress due to pain.   HEENT: Sclerae were clear. Pupils are equal, round, reactive to light and accommodation. Extraocular movements intact.  NECK: Supple. No palpable cervical adenopathy.   LUNGS: Clear to auscultation.   CARDIOVASCULAR: Regular rhythm and rate without audible murmurs.   ABDOMEN: Soft, nontender abdomen.    GU/RECTAL: Deferred.   NEUROMUSCULAR: Nonfocal. The patient was alert and oriented x3.   IMPRESSION: Right ureterolithiasis with obstruction and sepsis.   PLAN:  1. Cysto with stent placement. 2. IV antibiotics.   ____________________________ Otelia Limes. Yves Dill, MD [email or web address] D: 08/06/2011 20:32:29 ET T: 08/07/2011 05:55:54 ET JOB#: [id number]  cc: Otelia Limes. Yves Dill, MD, <Dictator> Royston Cowper MD ELECTRONICALLY SIGNED 08/07/2011 8:39

## 2014-05-01 NOTE — Op Note (Signed)
PATIENT NAME:  Amanda, Mcgee MR#:  [id number] DATE OF BIRTH:  12/05/1955  DATE OF PROCEDURE:  08/06/2011  PREOPERATIVE DIAGNOSES:  1. Right ureterolithiasis.  2. Urinary tract infection.  3. Right hydronephrosis.   POSTOPERATIVE DIAGNOSES:  1. Right ureterolithiasis.  2. Urinary tract infection.  3. Right hydronephrosis.   PROCEDURES:  1. Cystoscopy with right stent placement.  2. Fluoroscopy.   SURGEON: Maryan Puls, M.D.   ANESTHETIST: Lucilla Edin, MD  ANESTHESIA: General.   INDICATIONS: See the dictated history and physical. After informed consent, the patient requests the above procedure.   OPERATIVE SUMMARY: After adequate general anesthesia had been obtained, the patient was placed into the dorsal lithotomy position and the perineum was prepped and draped in the usual fashion. Fluoroscopy was performed and stone was identified in the distal ureter. At this point, the 36 French cystoscope was coupled with the camera and then advanced into the bladder. The bladder was thoroughly inspected. Both ureteral orifices were identified. No bladder tumors were identified. At this point, a 0.035 Glidewire was advanced into the right orifice. There was some resistance at the location of the stone but I was able to pass the wire beyond the stone and curl it into the renal pelvis. A 6 x 24 cm double pigtail stent was then advanced over the guidewire and positioned in the ureter under fluoroscopic guidance. The guidewire was then removed taking care to leave the stent in position. The bladder was then drained and the scope was removed. 10 mL of viscous Xylocaine was instilled within the urethra and the bladder. A B and O suppository was placed. The procedure was then terminated and the patient was transferred to the recovery room in stable condition. ____________________________ Otelia Limes. Yves Dill, MD [email or web address]:slb D: 08/06/2011 22:20:11 ET T: 08/07/2011 10:34:27 ET JOB#: [id number]  cc: Otelia Limes.  Yves Dill, MD, <Dictator> Royston Cowper MD ELECTRONICALLY SIGNED 08/10/2011 7:28

## 2014-05-01 NOTE — H&P (Signed)
PATIENT NAME:  Amanda Mcgee, Amanda Mcgee MR#:  [id number] DATE OF BIRTH:  11-Jan-1956  DATE OF ADMISSION:  08/31/2011  CHIEF COMPLAINT: Urinary stent.   HISTORY OF PRESENT ILLNESS: Amanda Mcgee is a 59 year old white female with right ureterolithiasis who underwent stent placement in July and subsequently had lithotripsy 08/01. She was seen in the office 08/08 and was found to be stone free. She now comes in for cystoscopy with stent retrieval.   ALLERGIES: Patient is allergic to Benadryl, penicillin, Claritin and codeine.   CURRENT MEDICATIONS:  1. Cetirizine. 2. Bupropion. 3. Citalopram.   4. Atorvastatin.  5. Vitamins.  PAST SURGICAL HISTORY:  1. Appendectomy in 1968.  2. Hysterectomy in 1989. 3. Pubovaginal sling 2011.  4. Stent placement with lithotripsy 2012.   SOCIAL HISTORY: She smokes a pack a day and has a greater than 30 pack year history. She denied alcohol use.   FAMILY HISTORY: Remarkable for hypertension.   PAST AND CURRENT MEDICAL CONDITIONS: 1. Hypercholesterolemia.  2. Depression. 3. Allergic rhinitis.  REVIEW OF SYSTEMS: Patient denied chest pain, shortness of breath, diabetes, stroke or coronary artery disease.   PHYSICAL EXAMINATION: GENERAL: Well nourished white female in no acute distress.  HEENT: Sclera were clear. Pupils equally round, reactive to light and accommodation. Extraocular movements are intact.   NECK: Supple. No palpable cervical adenopathy.  LUNGS: Clear to auscultation.   CARDIOVASCULAR: Regular rhythm and rate without audible murmurs.  ABDOMEN: Soft, nontender abdomen.   GENITOURINARY/RECTAL: Deferred.  NEUROMUSCULAR: Patient alert and oriented x3.   IMPRESSION: Right ureterolithiasis status post stent placement and lithotripsy.   PLAN: Cystoscopy with stent retrieval.   ____________________________ Otelia Limes. Yves Dill, MD mrw:cms D: 08/25/2011 18:03:00 ET T: 08/26/2011 06:04:20 ET JOB#: [id number]  cc: Otelia Limes. Yves Dill, MD,  <Dictator> Royston Cowper MD ELECTRONICALLY SIGNED 08/26/2011 12:47

## 2014-05-01 NOTE — Op Note (Signed)
PATIENT NAME:  Amanda Mcgee, Amanda Mcgee MR#:  [id number] DATE OF BIRTH:  06/01/1955  DATE OF PROCEDURE:  08/31/2011  PREOPERATIVE DIAGNOSIS: Ureterolithiasis and stent.   POSTOPERATIVE DIAGNOSIS: Ureterolithiasis and stent.   PROCEDURE: Cystoscopy with stent retrieval.   SURGEON: Maryan Puls, MD  ANESTHETIST: Lucilla Edin, MD  ANESTHESIA: General.   INDICATIONS: See the dictated history and physical. After informed consent, the patient requested the above procedure.   OPERATIVE SUMMARY: After adequate general anesthesia had been obtained, the patient was placed into the dorsal lithotomy position and the perineum was prepped and draped in the usual fashion. The 21 French cystoscope was coupled with the camera and then placed into the bladder. Bladder was inspected. No lesions were identified. Stent was then identified and engaged with the alligator forceps and removed. 10 mL of viscous Xylocaine was then instilled within the urethra and the bladder. The procedure was terminated. The patient was then transferred to the recovery room in stable condition. ____________________________ Otelia Limes. Yves Dill, MD [email or web address]:slb D: 08/31/2011 12:53:47 ET     T: 08/31/2011 13:22:30 ET         JOB#: [id number] cc: Otelia Limes. Yves Dill, MD, <Dictator> Royston Cowper MD ELECTRONICALLY SIGNED 08/31/2011 16:59

## 2014-10-02 ENCOUNTER — Observation Stay
Admission: EM | Admit: 2014-10-02 | Discharge: 2014-10-03 | Disposition: A | Discharge status: Home / Self Care | Payer: No Typology Code available for payment source | Attending: Internal Medicine | Admitting: Internal Medicine

## 2014-10-02 ENCOUNTER — Emergency Department: Payer: No Typology Code available for payment source

## 2014-10-02 DIAGNOSIS — Z9889 Other specified postprocedural states: Secondary | ICD-10-CM | POA: Insufficient documentation

## 2014-10-02 DIAGNOSIS — I10 Essential (primary) hypertension: Secondary | ICD-10-CM | POA: Diagnosis not present

## 2014-10-02 DIAGNOSIS — R51 Headache: Secondary | ICD-10-CM | POA: Insufficient documentation

## 2014-10-02 DIAGNOSIS — Z7982 Long term (current) use of aspirin: Secondary | ICD-10-CM | POA: Insufficient documentation

## 2014-10-02 DIAGNOSIS — R0602 Shortness of breath: Secondary | ICD-10-CM | POA: Insufficient documentation

## 2014-10-02 DIAGNOSIS — R110 Nausea: Secondary | ICD-10-CM | POA: Diagnosis present

## 2014-10-02 DIAGNOSIS — Z90710 Acquired absence of both cervix and uterus: Secondary | ICD-10-CM | POA: Diagnosis not present

## 2014-10-02 DIAGNOSIS — Z79899 Other long term (current) drug therapy: Secondary | ICD-10-CM | POA: Diagnosis not present

## 2014-10-02 DIAGNOSIS — Z87442 Personal history of urinary calculi: Secondary | ICD-10-CM | POA: Diagnosis not present

## 2014-10-02 DIAGNOSIS — F17210 Nicotine dependence, cigarettes, uncomplicated: Secondary | ICD-10-CM | POA: Insufficient documentation

## 2014-10-02 DIAGNOSIS — F32A Depression, unspecified: Secondary | ICD-10-CM | POA: Diagnosis present

## 2014-10-02 DIAGNOSIS — R739 Hyperglycemia, unspecified: Secondary | ICD-10-CM | POA: Diagnosis present

## 2014-10-02 DIAGNOSIS — Z888 Allergy status to other drugs, medicaments and biological substances status: Secondary | ICD-10-CM | POA: Diagnosis not present

## 2014-10-02 DIAGNOSIS — R079 Chest pain, unspecified: Principal | ICD-10-CM | POA: Insufficient documentation

## 2014-10-02 DIAGNOSIS — R61 Generalized hyperhidrosis: Secondary | ICD-10-CM | POA: Diagnosis present

## 2014-10-02 DIAGNOSIS — F329 Major depressive disorder, single episode, unspecified: Secondary | ICD-10-CM | POA: Diagnosis not present

## 2014-10-02 DIAGNOSIS — Z7951 Long term (current) use of inhaled steroids: Secondary | ICD-10-CM | POA: Diagnosis not present

## 2014-10-02 DIAGNOSIS — Z880 Allergy status to penicillin: Secondary | ICD-10-CM | POA: Insufficient documentation

## 2014-10-02 DIAGNOSIS — F419 Anxiety disorder, unspecified: Secondary | ICD-10-CM | POA: Diagnosis present

## 2014-10-02 DIAGNOSIS — Z885 Allergy status to narcotic agent status: Secondary | ICD-10-CM | POA: Diagnosis not present

## 2014-10-02 DIAGNOSIS — E785 Hyperlipidemia, unspecified: Secondary | ICD-10-CM | POA: Diagnosis not present

## 2014-10-02 DIAGNOSIS — M1990 Unspecified osteoarthritis, unspecified site: Secondary | ICD-10-CM | POA: Diagnosis not present

## 2014-10-02 DIAGNOSIS — Z803 Family history of malignant neoplasm of breast: Secondary | ICD-10-CM | POA: Diagnosis not present

## 2014-10-02 HISTORY — DX: Essential (primary) hypertension: I10

## 2014-10-02 HISTORY — DX: Anxiety disorder, unspecified: F41.9

## 2014-10-02 LAB — CBC
HCT: 39.5 % (ref 35.0–47.0)
Hemoglobin: 13.8 g/dL (ref 12.0–16.0)
MCH: 31.9 pg (ref 26.0–34.0)
MCHC: 34.9 g/dL (ref 32.0–36.0)
MCV: 91.6 fL (ref 80.0–100.0)
PLATELETS: 202 10*3/uL (ref 150–440)
RBC: 4.31 MIL/uL (ref 3.80–5.20)
RDW: 12.6 % (ref 11.5–14.5)
WBC: 8.3 10*3/uL (ref 3.6–11.0)

## 2014-10-02 LAB — BASIC METABOLIC PANEL
Anion gap: 6 (ref 5–15)
BUN: 18 mg/dL (ref 6–20)
CALCIUM: 8.9 mg/dL (ref 8.9–10.3)
CHLORIDE: 106 mmol/L (ref 101–111)
CO2: 25 mmol/L (ref 22–32)
CREATININE: 0.78 mg/dL (ref 0.44–1.00)
Glucose, Bld: 154 mg/dL — ABNORMAL HIGH (ref 65–99)
Potassium: 3.5 mmol/L (ref 3.5–5.1)
SODIUM: 137 mmol/L (ref 135–145)

## 2014-10-02 LAB — TROPONIN I

## 2014-10-02 MED ORDER — MORPHINE SULFATE (PF) 2 MG/ML IV SOLN
2.0000 mg | INTRAVENOUS | Status: DC | PRN
Start: 2014-10-02 — End: 2014-10-03

## 2014-10-02 MED ORDER — ONDANSETRON HCL 4 MG/2ML IJ SOLN
4.0000 mg | Freq: Once | INTRAMUSCULAR | Status: AC
  Administered 2014-10-02: 4 mg via INTRAVENOUS
  Filled 2014-10-02: qty 2

## 2014-10-02 MED ORDER — ENOXAPARIN SODIUM 40 MG/0.4ML ~~LOC~~ SOLN: 40.0000 mg | SUBCUTANEOUS | Status: DC

## 2014-10-02 MED ORDER — SODIUM CHLORIDE 0.9 % IJ SOLN
3.0000 mL | Freq: Two times a day (BID) | INTRAMUSCULAR | Status: DC
  Administered 2014-10-03: 3 mL via INTRAVENOUS

## 2014-10-02 MED ORDER — ACETAMINOPHEN 650 MG RE SUPP
650.0000 mg | Freq: Four times a day (QID) | RECTAL | Status: DC | PRN
Start: 2014-10-02 — End: 2014-10-03

## 2014-10-02 MED ORDER — DOXEPIN HCL 10 MG PO CAPS
10.0000 mg | ORAL_CAPSULE | Freq: Every day | ORAL | Status: DC
  Filled 2014-10-02 (×2): qty 1

## 2014-10-02 MED ORDER — MORPHINE SULFATE (PF) 4 MG/ML IV SOLN
4.0000 mg | Freq: Once | INTRAVENOUS | Status: AC
  Administered 2014-10-02: 4 mg via INTRAVENOUS
  Filled 2014-10-02: qty 1

## 2014-10-02 MED ORDER — DOCUSATE SODIUM 100 MG PO CAPS
100.0000 mg | ORAL_CAPSULE | Freq: Two times a day (BID) | ORAL | Status: DC | PRN
  Administered 2014-10-03 (×2): 100 mg via ORAL
  Filled 2014-10-02 (×2): qty 1

## 2014-10-02 MED ORDER — ONDANSETRON HCL 4 MG PO TABS: 4.0000 mg | ORAL_TABLET | Freq: Four times a day (QID) | ORAL | Status: DC | PRN

## 2014-10-02 MED ORDER — BUPROPION HCL ER (SR) 100 MG PO TB12
200.0000 mg | ORAL_TABLET | Freq: Two times a day (BID) | ORAL | Status: DC
  Administered 2014-10-03 (×2): 200 mg via ORAL
  Filled 2014-10-02 (×2): qty 2

## 2014-10-02 MED ORDER — NITROGLYCERIN 2 % TD OINT
1.0000 [in_us] | TOPICAL_OINTMENT | Freq: Four times a day (QID) | TRANSDERMAL | Status: DC
  Administered 2014-10-02 – 2014-10-03 (×3): 1 [in_us] via TOPICAL
  Filled 2014-10-02 (×3): qty 1

## 2014-10-02 MED ORDER — LISINOPRIL 10 MG PO TABS
10.0000 mg | ORAL_TABLET | Freq: Every day | ORAL | Status: DC
  Administered 2014-10-03: 10 mg via ORAL
  Filled 2014-10-02: qty 1

## 2014-10-02 MED ORDER — ALPRAZOLAM 0.5 MG PO TABS
0.5000 mg | ORAL_TABLET | Freq: Two times a day (BID) | ORAL | Status: DC
  Administered 2014-10-03 (×2): 0.5 mg via ORAL
  Filled 2014-10-02 (×2): qty 1

## 2014-10-02 MED ORDER — CITALOPRAM HYDROBROMIDE 20 MG PO TABS
40.0000 mg | ORAL_TABLET | Freq: Every day | ORAL | Status: DC
  Administered 2014-10-03: 40 mg via ORAL
  Filled 2014-10-02: qty 2

## 2014-10-02 MED ORDER — ACETAMINOPHEN 325 MG PO TABS
650.0000 mg | ORAL_TABLET | Freq: Four times a day (QID) | ORAL | Status: DC | PRN
  Administered 2014-10-03 (×3): 650 mg via ORAL
  Filled 2014-10-02 (×3): qty 2

## 2014-10-02 MED ORDER — ONDANSETRON HCL 4 MG/2ML IJ SOLN: 4.0000 mg | Freq: Four times a day (QID) | INTRAMUSCULAR | Status: DC | PRN

## 2014-10-02 MED ORDER — SODIUM CHLORIDE 0.9 % IV SOLN
INTRAVENOUS | Status: AC
  Administered 2014-10-03: 01:00:00 via INTRAVENOUS

## 2014-10-02 NOTE — ED Notes (Signed)
Patient transported to X-ray at this time 

## 2014-10-02 NOTE — ED Notes (Signed)
MD Willis at bedside. 

## 2014-10-02 NOTE — H&P (Signed)
[place] [facility] [place] NAME: Amanda Mcgee    MR#:  [id number]  DATE OF BIRTH:  08-18-1955  DATE OF ADMISSION:  10/02/2014  PRIMARY CARE PHYSICIAN: Lavera Guise, MD   REQUESTING/REFERRING PHYSICIAN: Mariea Clonts, MD  CHIEF COMPLAINT:   Chief Complaint  Patient presents with  . Chest Pain    HISTORY OF PRESENT ILLNESS:  Amanda Mcgee  is a 59 y.o. female who presents with chest pain which started at rest. Patient states that she had left-sided sharp chest pain with some numbness tingling in her left arm. This started while she was sitting down today, but continued persistently throughout the day coming and going. It grew worse in intensity, and she called EMS to come to the ED for evaluation. She did have some associated nausea, though she denies any diaphoresis, vomiting, shortness of breath, headache, blurred vision. In the ED labs are largely benign. Patient does state that her pain did start to ease some when she got sublingual nitroglycerin per EMS, and then she felt significantly better when she got morphine here in the ED. Hospitalists were called for admission for possible ACS.  PAST MEDICAL HISTORY:   Past Medical History  Diagnosis Date  . Arthritis   . Depression   . Hyperlipidemia   . Kidney stones 2013  . Anxiety   . HTN (hypertension)     PAST SURGICAL HISTORY:   Past Surgical History  Procedure Laterality Date  . Abdominal hysterectomy  1990  . Bladder surgery  2011  . Lithotripsy  06/2011  . Appendectomy  1969    SOCIAL HISTORY:   Social History  Substance Use Topics  . Smoking status: Current Every Day Smoker -- 0.50 packs/day for 30 years    Types: Cigarettes  . Smokeless tobacco: Never Used  . Alcohol Use: Yes    FAMILY HISTORY:   Family History  Problem Relation Age of Onset  . Breast cancer Mother 44    DRUG ALLERGIES:   Allergies  Allergen Reactions  . Benadryl [Diphenhydramine Hcl] Swelling   . Codeine Swelling  . Penicillins     Upset stomach     MEDICATIONS AT HOME:   Prior to Admission medications   Medication Sig Start Date End Date Taking? Authorizing Provider  ALPRAZolam Duanne Moron) 0.5 MG tablet Take 0.5 mg by mouth 2 (two) times daily.   Yes Historical Provider, MD  buPROPion (WELLBUTRIN SR) 200 MG 12 hr tablet Take 1 tablet by mouth 2 (two) times daily. 07/09/12  Yes Historical Provider, MD  citalopram (CELEXA) 40 MG tablet Take 1 tablet by mouth daily. 07/09/12  Yes Historical Provider, MD  CRANBERRY PO Take 1 tablet by mouth 2 (two) times daily.   Yes Historical Provider, MD  docusate sodium (COLACE) 100 MG capsule Take 100 mg by mouth 2 (two) times daily as needed for mild constipation.   Yes Historical Provider, MD  doxepin (SINEQUAN) 10 MG capsule Take 10 mg by mouth at bedtime.   Yes Historical Provider, MD  escitalopram (LEXAPRO) 20 MG tablet Take 20 mg by mouth daily.   Yes Historical Provider, MD  fluticasone (FLONASE) 50 MCG/ACT nasal spray Place 1 spray into both nostrils daily.   Yes Historical Provider, MD  levocetirizine (XYZAL) 5 MG tablet Take 1 tablet by mouth daily. 07/09/12  Yes Historical Provider, MD  lisinopril (PRINIVIL,ZESTRIL) 10 MG tablet Take 10 mg by mouth daily.   Yes Historical Provider, MD  REVIEW OF SYSTEMS:  Review of Systems  Constitutional: Negative for fever, chills, weight loss and malaise/fatigue.  HENT: Negative for ear pain, hearing loss and tinnitus.   Eyes: Negative for blurred vision, double vision, pain and redness.  Respiratory: Negative for cough, hemoptysis and shortness of breath.   Cardiovascular: Positive for chest pain. Negative for palpitations, orthopnea and leg swelling.  Gastrointestinal: Negative for nausea, vomiting, abdominal pain, diarrhea and constipation.  Genitourinary: Negative for dysuria, frequency and hematuria.  Musculoskeletal: Negative for back pain, joint pain and neck pain.  Skin:       No acne,  rash, or lesions  Neurological: Negative for dizziness, tremors, focal weakness and weakness.  Endo/Heme/Allergies: Negative for polydipsia. Does not bruise/bleed easily.  Psychiatric/Behavioral: Negative for depression. The patient is nervous/anxious. The patient does not have insomnia.      VITAL SIGNS:   Filed Vitals:   10/02/14 2100 10/02/14 2130 10/02/14 2145 10/02/14 2215  BP: 172/87 149/93 151/92 138/92  Pulse: 70 61 71 73  Temp:      TempSrc:      Resp: 20 10 18 13   Height:      Weight:      SpO2: 97% 97% 98% 100%   Wt Readings from Last 3 Encounters:  10/02/14 73.936 kg (163 lb)  08/08/12 82.101 kg (181 lb)    PHYSICAL EXAMINATION:  Physical Exam  Vitals reviewed. Constitutional: She is oriented to person, place, and time. She appears well-developed and well-nourished. No distress.  HENT:  Head: Normocephalic and atraumatic.  Mouth/Throat: Oropharynx is clear and moist.  Eyes: Conjunctivae and EOM are normal. Pupils are equal, round, and reactive to light. No scleral icterus.  Neck: Normal range of motion. Neck supple. No JVD present. No thyromegaly present.  Cardiovascular: Normal rate, regular rhythm and intact distal pulses.  Exam reveals no gallop and no friction rub.   No murmur heard. Respiratory: Effort normal and breath sounds normal. No respiratory distress. She has no wheezes. She has no rales.  GI: Soft. Bowel sounds are normal. She exhibits no distension. There is no tenderness.  Musculoskeletal: Normal range of motion. She exhibits no edema.  No arthritis, no gout  Lymphadenopathy:    She has no cervical adenopathy.  Neurological: She is alert and oriented to person, place, and time. No cranial nerve deficit.  No dysarthria, no aphasia  Skin: Skin is warm and dry. No rash noted. No erythema.  Psychiatric: She has a normal mood and affect. Her behavior is normal. Judgment and thought content normal.    LABORATORY PANEL:   CBC  Recent Labs Lab  10/02/14 1954  WBC 8.3  HGB 13.8  HCT 39.5  PLT 202   ------------------------------------------------------------------------------------------------------------------  Chemistries   Recent Labs Lab 10/02/14 1954  NA 137  K 3.5  CL 106  CO2 25  GLUCOSE 154*  BUN 18  CREATININE 0.78  CALCIUM 8.9   ------------------------------------------------------------------------------------------------------------------  Cardiac Enzymes  Recent Labs Lab 10/02/14 1954  TROPONINI <0.03   ------------------------------------------------------------------------------------------------------------------  RADIOLOGY:  Dg Chest 2 View  10/02/2014   CLINICAL DATA:  Chest tightness, chest pain which is tight and stabbing associated with shortness of breath and nausea, new back pain beginning 2 days ago, smoker  EXAM: CHEST  2 VIEW  COMPARISON:  04/28/2007  FINDINGS: Normal heart size, mediastinal contours, and pulmonary vascularity.  Atherosclerotic calcification aorta.  Minimal bronchitic changes without infiltrate, pleural effusion or pneumothorax.  Bones unremarkable.  IMPRESSION: Minimal bronchitic changes without infiltrate.  Electronically Signed   By: Lavonia Dana M.D.   On: 10/02/2014 20:21    EKG:   Orders placed or performed during the hospital encounter of 10/02/14  . EKG 12-Lead  . EKG 12-Lead  . EKG 12-Lead  . EKG 12-Lead  . ED EKG within 10 minutes  . ED EKG within 10 minutes    IMPRESSION AND PLAN:  Principal Problem:   Chest pain at rest - unclear if ACS at this time. EKG without ischemic changes, cardiac enzymes 1 negative in the ED, other labs largely within normal limits. 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 However, she states that she's never had an episode like this before. Trend her cardiac enzymes tonight, based on those results can evaluate if we feel like  cardiology consult is warranted in the morning. Active Problems:   Anxiety - continue home anxiolytics   HTN (hypertension) - continue home antihypertensives with IV antihypertensives when necessary if blood pressure is not controlled on home medicines   Depression - continue home meds   HLD (hyperlipidemia) - continue home statin  All the records are reviewed and case discussed with ED provider. Management plans discussed with the patient and/or family.  DVT PROPHYLAXIS: SubQ lovenox  ADMISSION STATUS: Observation  CODE STATUS: Full  TOTAL TIME TAKING CARE OF THIS PATIENT: 45 minutes.    WILLIS, DAVID FIELDING 10/02/2014, 10:49 PM  [facility]  Office  [phone]  CC: Primary care physician; Lavera Guise, MD

## 2014-10-02 NOTE — ED Notes (Signed)
MD Norman at bedside 

## 2014-10-02 NOTE — ED Provider Notes (Addendum)
[facility] Emergency Department Provider Note  ____________________________________________  Time seen: Approximately 7:59 PM  I have reviewed the triage vital signs and the nursing notes.   HISTORY  Chief Complaint Chest Pain    HPI Amanda Mcgee is a 59 y.o. female with a history of HTN, HL presenting with chest pain. Patient states that she was sitting eating tail mix when she had acute onset of a left-sided chest pain described as "stabbing" that did not radiate. She did have associated left upper extremity tingling. It was associated with nausea without vomiting, and diaphoresis. No palpitations or syncope. No other recent chest pain. Symptoms persisted until EMS arrived and significantly improved with sublingual nitroglycerin. The patient received chewable aspirin by EMS. At this time, the patient has minimal chest pain and mild nausea.  Patient has no history of cardiac catheterization and no recent history of stress test.   Past Medical History  Diagnosis Date  . Arthritis   . Depression   . Hyperlipidemia   . Kidney stones 2013    Patient Active Problem List   Diagnosis Date Noted  . Abnormal mammogram 08/09/2012    Past Surgical History  Procedure Laterality Date  . Abdominal hysterectomy  1990  . Bladder surgery  2011  . Lithotripsy  06/2011  . Appendectomy  1969    Current Outpatient Rx  Name  Route  Sig  Dispense  Refill  . atorvastatin (LIPITOR) 10 MG tablet   Oral   Take 1 tablet by mouth daily.         . Black Cohosh 160 MG CAPS   Oral   Take 1 capsule by mouth daily.         Marland Kitchen buPROPion (WELLBUTRIN SR) 200 MG 12 hr tablet   Oral   Take 1 tablet by mouth 2 (two) times daily.         . Calcium Carbonate-Vitamin D (CALCIUM + D PO)   Oral   Take 1 tablet by mouth daily.         . citalopram (CELEXA) 40 MG tablet   Oral   Take 1 tablet by mouth daily.         Marland Kitchen levocetirizine (XYZAL) 5 MG tablet   Oral    Take 1 tablet by mouth daily.         Marland Kitchen PREVIDENT 5000 SENSITIVE 1.1-5 % PSTE   Nasal   Place 1 spray into the nose daily.         Orlie Dakin Sodium (STOOL SOFTENER & LAXATIVE PO)   Oral   Take 1 tablet by mouth 2 (two) times daily.           Allergies Benadryl; Codeine; and Penicillins  Family History  Problem Relation Age of Onset  . Breast cancer Mother 23    Social History Social History  Substance Use Topics  . Smoking status: Current Every Day Smoker -- 0.50 packs/day for 30 years    Types: Cigarettes  . Smokeless tobacco: Never Used  . Alcohol Use: Yes   denies cocaine use  Review of Systems  Constitutional: No fever/chills positive diaphoresis Eyes: No visual changes. ENT: No sore throat. Cardiovascular: Positive chest pain, no palpitations. Respiratory: Denies shortness of breath.  No cough. Gastrointestinal: No abdominal pain.  Nausea without vomiting.  No diarrhea.  No constipation. Genitourinary: Negative for dysuria. Musculoskeletal: Negative for back pain. Skin: Negative for rash. Neurological: Negative for headaches, focal weakness or numbness. Psychiatric:History of anxiety  with recent stressors  10-point ROS otherwise negative.  ____________________________________________   PHYSICAL EXAM:  VITAL SIGNS: ED Triage Vitals  Enc Vitals Group     BP 10/02/14 1944 173/94 mmHg     Pulse Rate 10/02/14 1944 78     Resp 10/02/14 1944 17     Temp 10/02/14 1944 98.2 F (36.8 C)     Temp Source 10/02/14 1944 Oral     SpO2 10/02/14 1944 98 %     Weight 10/02/14 1944 163 lb (73.936 kg)     Height 10/02/14 1944 5\' 5"  (1.651 m)     Head Cir --      Peak Flow --      Pain Score 10/02/14 1946 6     Pain Loc --      Pain Edu? --      Excl. in [place]? --      Constitutional: Alert and oriented. Well appearing and in no acute distress. Answer question appropriately. Eyes: Conjunctivae are normal.  EOMI. Head: Atraumatic. Nose: No  congestion/rhinnorhea. Mouth/Throat: Mucous membranes are moist.  Neck: No stridor.  Supple.  No JVD Cardiovascular: Normal rate, regular rhythm. No murmurs, rubs or gallops.  Respiratory: Normal respiratory effort.  No retractions. Lungs CTAB.  No wheezes, rales or ronchi. Gastrointestinal: Soft and nontender. No distention. No peritoneal signs. Musculoskeletal: No LE edema. No palpable cords or Homans sign Neurologic:  Normal speech and language. No gross focal neurologic deficits are appreciated.  Skin:  Skin is warm, dry and intact. No rash noted. Psychiatric: Mood and affect are normal. Speech and behavior are normal.  Normal judgement.  ____________________________________________   LABS (all labs ordered are listed, but only abnormal results are displayed)  Labs Reviewed  BASIC METABOLIC PANEL - Abnormal; Notable for the following:    Glucose, Bld 154 (*)    All other components within normal limits  CBC  TROPONIN I   ____________________________________________  EKG  ED ECG REPORT I, Eula Listen, the attending physician, personally viewed and interpreted this ECG.   Date: 10/02/2014  EKG Time: 19:39  Rate: 76  Rhythm: normal sinus rhythm  Axis: Normal  Intervals:none  ST&T Change: No ST elevation, no STEMI. Nonspecific T-wave inversion in V1.  ____________________________________________  RADIOLOGY  Dg Chest 2 View  10/02/2014   CLINICAL DATA:  Chest tightness, chest pain which is tight and stabbing associated with shortness of breath and nausea, new back pain beginning 2 days ago, smoker  EXAM: CHEST  2 VIEW  COMPARISON:  04/28/2007  FINDINGS: Normal heart size, mediastinal contours, and pulmonary vascularity.  Atherosclerotic calcification aorta.  Minimal bronchitic changes without infiltrate, pleural effusion or pneumothorax.  Bones unremarkable.  IMPRESSION: Minimal bronchitic changes without infiltrate.   Electronically Signed   By: Lavonia Dana M.D.    On: 10/02/2014 20:21    ____________________________________________   PROCEDURES  Procedure(s) performed: None  Critical Care performed: No ____________________________________________   INITIAL IMPRESSION / ASSESSMENT AND PLAN / ED COURSE  Pertinent labs & imaging results that were available during my care of the patient were reviewed by me and considered in my medical decision making (see chart for details).  59 y.o. female with history of hypertension, hyperlipidemia presenting with left-sided chest pain associated with nausea and diaphoresis. She has had no recent risk stratification. EKG does not show STEMI or acute ischemic event, but the patient will need admission for further evaluation. Differential diagnosis includes ACS/MI, esophageal spasm, GERD.  PE is very unlikely.  -----------------------------------------  9:41 PM on 10/02/2014 -----------------------------------------  Patient is resting comfortably but still has mild left sided pain and nausea. Her EKG, chest x-ray and troponin are not suggestive of acute MI but she will still require admission for continued monitoring as well as cardiac risk stratification. ____________________________________________  FINAL CLINICAL IMPRESSION(S) / ED DIAGNOSES  Final diagnoses:  Chest pain, unspecified chest pain type  Diaphoresis  Nausea without vomiting  Hyperglycemia      NEW MEDICATIONS STARTED DURING THIS VISIT:  New Prescriptions   No medications on file     Eula Listen, MD 10/02/14 2141  Eula Listen, MD 10/02/14 2146

## 2014-10-02 NOTE — ED Notes (Signed)
Patient returned from X-ray 

## 2014-10-02 NOTE — ED Notes (Signed)
[facility]-Patient at home talking with her mother when she started to feel tightness and pain in her chest. She states that two days ago she started to have some new back pain. She reports that her chest pain feels like 'tightness and stabbing' with associated SOB and nausea with no vomiting currently. She was given 2 doses of nitro spray and 4 aspirin prior to arrival to [facility]. She reports having some relief after the nitro spray.  Upon arrival to [facility] she reports having a 6 out of 10 chest pain. She reports that this may have been brought on by her mother's cancer and overall stress in her life recently.

## 2014-10-03 LAB — TROPONIN I: Troponin I: <0.03 ng/mL (ref <0.031)

## 2014-10-03 MED ORDER — ROSUVASTATIN CALCIUM 10 MG PO TABS: 10.0000 mg | ORAL_TABLET | Freq: Every day | ORAL | Status: DC

## 2014-10-03 MED ORDER — ASPIRIN EC 81 MG PO TBEC
81.0000 mg | DELAYED_RELEASE_TABLET | Freq: Every day | ORAL | Status: DC
  Administered 2014-10-03: 81 mg via ORAL
  Filled 2014-10-03: qty 1

## 2014-10-03 MED ORDER — ASPIRIN 81 MG PO TBEC: 81.0000 mg | DELAYED_RELEASE_TABLET | Freq: Every day | ORAL | Status: DC

## 2014-10-03 MED ORDER — ISOSORBIDE MONONITRATE ER 30 MG PO TB24
30.0000 mg | ORAL_TABLET | Freq: Every day | ORAL | Status: DC
  Administered 2014-10-03: 30 mg via ORAL
  Filled 2014-10-03: qty 1

## 2014-10-03 NOTE — Consult Note (Signed)
Primary Cardiologist: None   Reason for Consultation : Chest pain   HPI: This is a pleasant 36 YOF with PMHx HTN, HL, tobacco use, anxiety who presented to ER last night c/o left-sided CP. 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 She describes pain as sharp in nature and TTP. No concurrent SOB, nausea, or diaphoresis.        Review of Systems: General: negative for chills, fever, night sweats or weight changes.  Cardiovascular: positive for chest pain, negative for edema, orthopnea, palpitations, paroxysmal nocturnal dyspnea, shortness of breath or dyspnea on exertion Dermatological: negative for rash Respiratory: negative for cough or wheezing Urologic: negative for hematuria Abdominal: negative for nausea, vomiting, diarrhea, bright red blood per rectum, melena, or hematemesis Neurologic: negative for visual changes, syncope, or dizziness All other systems reviewed and are otherwise negative except as noted above.    Past Medical History  Diagnosis Date  . Arthritis   . Depression   . Hyperlipidemia   . Kidney stones 2013  . Anxiety   . HTN (hypertension)     Medications Prior to Admission  Medication Sig Dispense Refill  . ALPRAZolam (XANAX) 0.5 MG tablet Take 0.5 mg by mouth 2 (two) times daily.    Marland Kitchen buPROPion (WELLBUTRIN SR) 200 MG 12 hr tablet Take 1 tablet by mouth 2 (two) times daily.    . citalopram (CELEXA) 40 MG tablet Take 1 tablet by mouth daily.    Marland Kitchen CRANBERRY PO Take 1 tablet by mouth 2 (two) times daily.    Marland Kitchen docusate sodium (COLACE) 100 MG capsule Take 100 mg by mouth 2 (two) times daily as needed for mild constipation.    Marland Kitchen doxepin (SINEQUAN) 10 MG capsule Take 10 mg by mouth at bedtime.    Marland Kitchen escitalopram (LEXAPRO) 20 MG tablet Take 20 mg by mouth daily.    . fluticasone (FLONASE) 50 MCG/ACT nasal spray Place 1 spray into both nostrils daily.    Marland Kitchen levocetirizine (XYZAL) 5 MG  tablet Take 1 tablet by mouth daily.    Marland Kitchen lisinopril (PRINIVIL,ZESTRIL) 10 MG tablet Take 10 mg by mouth daily.       Marland Kitchen ALPRAZolam  0.5 mg Oral BID  . buPROPion  200 mg Oral BID  . citalopram  40 mg Oral Daily  . doxepin  10 mg Oral QHS  . enoxaparin (LOVENOX) injection  40 mg Subcutaneous Q24H  . lisinopril  10 mg Oral Daily  . nitroGLYCERIN  1 inch Topical 4 times per day  . sodium chloride  3 mL Intravenous Q12H    Infusions:    Allergies  Allergen Reactions  . Benadryl [Diphenhydramine Hcl] Swelling  . Codeine Swelling  . Penicillins     Upset stomach     Social History   Social History  . Marital Status: Married    Spouse Name: N/A  . Number of Children: N/A  . Years of Education: N/A   Occupational History  . Not on file.   Social History Main Topics  . Smoking status: Current Every Day Smoker -- 0.50 packs/day for 30 years    Types: Cigarettes  . Smokeless tobacco: Never Used  . Alcohol Use: Yes  . Drug Use: No  . Sexual Activity: Not on file   Other Topics Concern  . Not on file   Social History Narrative    Family History  Problem Relation Age of  Onset  . Breast cancer Mother 7    PHYSICAL EXAM: Filed Vitals:   10/03/14 0734  BP: 135/64  Pulse:   Temp:   Resp:      Intake/Output Summary (Last 24 hours) at 10/03/14 1027 Last data filed at 10/03/14 0829  Gross per 24 hour  Intake      0 ml  Output    150 ml  Net   -150 ml    General:  Well appearing. No respiratory difficulty HEENT: normal Neck: supple. no JVD. Carotids 2+ bilat; no bruits. No lymphadenopathy or thryomegaly appreciated. Cor: PMI nondisplaced. Regular rate & rhythm. No rubs, gallops or murmurs. Lungs: clear Abdomen: soft, nontender, nondistended. No hepatosplenomegaly. No bruits or masses. Good bowel sounds. Extremities: no cyanosis, clubbing, rash, edema Neuro: alert & oriented x 3, cranial nerves grossly intact. moves all 4 extremities w/o difficulty. Affect  pleasant.  ECG: NSR 79 BPM no acute changes   Results for orders placed or performed during the hospital encounter of 10/02/14 (from the past 24 hour(s))  Basic metabolic panel     Status: Abnormal   Collection Time: 10/02/14  7:54 PM  Result Value Ref Range   Sodium 137 135 - 145 mmol/L   Potassium 3.5 3.5 - 5.1 mmol/L   Chloride 106 101 - 111 mmol/L   CO2 25 22 - 32 mmol/L   Glucose, Bld 154 (H) 65 - 99 mg/dL   BUN 18 6 - 20 mg/dL   Creatinine, Ser 0.78 0.44 - 1.00 mg/dL   Calcium 8.9 8.9 - 10.3 mg/dL   GFR calc non Af Amer >60 >60 mL/min   GFR calc Af Amer >60 >60 mL/min   Anion gap 6 5 - 15  CBC     Status: None   Collection Time: 10/02/14  7:54 PM  Result Value Ref Range   WBC 8.3 3.6 - 11.0 K/uL   RBC 4.31 3.80 - 5.20 MIL/uL   Hemoglobin 13.8 12.0 - 16.0 g/dL   HCT 39.5 35.0 - 47.0 %   MCV 91.6 80.0 - 100.0 fL   MCH 31.9 26.0 - 34.0 pg   MCHC 34.9 32.0 - 36.0 g/dL   RDW 12.6 11.5 - 14.5 %   Platelets 202 150 - 440 K/uL  Troponin I     Status: None   Collection Time: 10/02/14  7:54 PM  Result Value Ref Range   Troponin I <0.03 <0.031 ng/mL  Troponin I     Status: None   Collection Time: 10/03/14  4:43 AM  Result Value Ref Range   Troponin I <0.03 <0.031 ng/mL   Dg Chest 2 View  10/02/2014   CLINICAL DATA:  Chest tightness, chest pain which is tight and stabbing associated with shortness of breath and nausea, new back pain beginning 2 days ago, smoker  EXAM: CHEST  2 VIEW  COMPARISON:  04/28/2007  FINDINGS: Normal heart size, mediastinal contours, and pulmonary vascularity.  Atherosclerotic calcification aorta.  Minimal bronchitic changes without infiltrate, pleural effusion or pneumothorax.  Bones unremarkable.  IMPRESSION: Minimal bronchitic changes without infiltrate.   Electronically Signed   By: Lavonia Dana M.D.   On: 10/02/2014 20:21     ASSESSMENT: Atypical CP   PLAN/DISCUSSION: Negative troponin, no EKG changes. Advise starting asa 81mg , imdur 30mg  and  crestor 10mg .Check echo today, if normal WM and EF, pt ok for d/c. Pt given appt for outpatient NST tomorrow at our office at 8:45.   Patient and plan discussed with  supervising Gauge Winski, Dr. Neoma Laming, who agrees with above findings.   Kelby Fam Eva, [place] 10/03/2014 10:27 AM

## 2014-10-03 NOTE — Progress Notes (Signed)
Discharge instructions along with home medication list and follow up gone over with patient. Prescriptions electronically submitted to pharmacy per md. Instructed patient to remain npo after mn for outpatient stress test in am. No c/o pain no distress noted. Iv and telemetry removed. Patient to be discharged home on [place]

## 2014-10-03 NOTE — Discharge Summary (Signed)
[facility] NAME: Amanda Mcgee    MR#:  [id number]  DATE OF BIRTH:  [birth year]  DATE OF ADMISSION:  10/02/2014 ADMITTING PHYSICIAN: Lance Coon, MD  DATE OF DISCHARGE:10/03/2014  PRIMARY CARE PHYSICIAN: Lavera Guise, MD    ADMISSION DIAGNOSIS:  Diaphoresis [R61] Hyperglycemia [R73.9] Nausea without vomiting [R11.0] Chest pain, unspecified chest pain type [R07.9]  DISCHARGE DIAGNOSIS:  Principal Problem:   Chest pain at rest Active Problems:   Depression   Anxiety   HTN (hypertension)   HLD (hyperlipidemia)   SECONDARY DIAGNOSIS:   Past Medical History  Diagnosis Date  . Arthritis   . Depression   . Hyperlipidemia   . Kidney stones 2013  . Anxiety   . 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  Seen by cardiologist- he agreed to do Nuclear stress test and Echocardigram in office tomorrow morning.   WIll give ASA and statin.   She had Headache with nitro paste- so will not give nitro at home.  * Anxiety - continue home anxiolytics * HTN (hypertension) - continue home antihypertensives  * Depression - continue home meds * HLD (hyperlipidemia) - continue home statin  DISCHARGE CONDITIONS:   Stable.  CONSULTS OBTAINED:  Treatment Team:  Dionisio David, MD  DRUG ALLERGIES:   Allergies  Allergen Reactions  . Benadryl [Diphenhydramine Hcl] Swelling  . Codeine Swelling  . Penicillins     Upset stomach     DISCHARGE MEDICATIONS:   Current Discharge Medication List    START taking these medications   Details  aspirin EC 81 MG EC tablet Take 1 tablet (81 mg total) by mouth daily. Qty: 30 tablet, Refills: 0    rosuvastatin (CRESTOR) 10 MG tablet Take 1 tablet (10 mg total) by mouth daily  at 6 PM. Qty: 30 tablet, Refills: 0      CONTINUE these medications which have NOT CHANGED   Details  ALPRAZolam (XANAX) 0.5 MG tablet Take 0.5 mg by mouth 2 (two) times daily.    buPROPion (WELLBUTRIN SR) 200 MG 12 hr tablet Take 1 tablet by mouth 2 (two) times daily.    citalopram (CELEXA) 40 MG tablet Take 1 tablet by mouth daily.    CRANBERRY PO Take 1 tablet by mouth 2 (two) times daily.    docusate sodium (COLACE) 100 MG capsule Take 100 mg by mouth 2 (two) times daily as needed for mild constipation.    doxepin (SINEQUAN) 10 MG capsule Take 10 mg by mouth at bedtime.    escitalopram (LEXAPRO) 20 MG tablet Take 20 mg by mouth daily.    fluticasone (FLONASE) 50 MCG/ACT nasal spray Place 1 spray into both nostrils daily.    levocetirizine (XYZAL) 5 MG tablet Take 1 tablet by mouth daily.    lisinopril (PRINIVIL,ZESTRIL) 10 MG tablet Take 10 mg by mouth daily.         DISCHARGE INSTRUCTIONS:    Follow with Dr. Humphrey Rolls in office tomorrow morning.  If you experience worsening of your admission symptoms, develop shortness of breath, life threatening emergency, suicidal or homicidal thoughts you must seek medical attention immediately by calling 911 or calling your MD immediately  if symptoms less severe.  You Must read complete instructions/literature along with all the possible adverse reactions/side effects for all the Medicines you take and that have been prescribed to you. Take any new Medicines after you have completely understood and accept all the possible adverse reactions/side effects.   Please note  You were cared for by a hospitalist during your hospital stay. If you have any questions about your discharge medications or the care you received while you were in the hospital after you are discharged, you can call the unit and asked to speak with the hospitalist on call if the hospitalist that took care of you is not available. Once you are discharged, your primary care  physician will handle any further medical issues. Please note that NO REFILLS for any discharge medications will be authorized once you are discharged, as it is imperative that you return to your primary care physician (or establish a relationship with a primary care physician if you do not have one) for your aftercare needs so that they can reassess your need for medications and monitor your lab values.  Today   CHIEF COMPLAINT:   Chief Complaint  Patient presents with  . Chest Pain    HISTORY OF PRESENT ILLNESS:  Amanda Mcgee  is a 59 y.o. female who presents with chest pain which started at rest. Patient states that she had left-sided sharp chest pain with some numbness tingling in her left arm. This started while she was sitting down today, but continued persistently throughout the day coming and going. It grew worse in intensity, and she called EMS to come to the ED for evaluation. She did have some associated nausea, though she denies any diaphoresis, vomiting, shortness of breath, headache, blurred vision. In the ED labs are largely benign. Patient does state that her pain did start to ease some when she got sublingual nitroglycerin per EMS, and then she felt significantly better when she got morphine here in the ED. Hospitalists were called for admission for possible ACS.   VITAL SIGNS:  Blood pressure 134/76, pulse 66, temperature 97.5 F (36.4 C), temperature source Oral, resp. rate 18, height 5\' 5"  (1.651 m), weight 74.299 kg (163 lb 12.8 oz), SpO2 98 %.  I/O:   Intake/Output Summary (Last 24 hours) at 10/03/14 1253 Last data filed at 10/03/14 1215  Gross per 24 hour  Intake    540 ml  Output    750 ml  Net   -210 ml    PHYSICAL EXAMINATION:   Constitutional: She is oriented to person, place, and time. She appears well-developed and well-nourished. No distress.  HENT:  Head: Normocephalic and atraumatic.  Mouth/Throat: Oropharynx is clear and moist.  Eyes: Conjunctivae  and EOM are normal. Pupils are equal, round, and reactive to light. No scleral icterus.  Neck: Normal range of motion. Neck supple. No JVD present. No thyromegaly present.  Cardiovascular: Normal rate, regular rhythm and intact distal pulses. Exam reveals no gallop and no friction rub.  No murmur heard. Respiratory: Effort normal and breath sounds normal. No respiratory distress. She has no wheezes. She has no rales.  GI: Soft. Bowel sounds are normal. She exhibits no distension. There is no tenderness.  Musculoskeletal: Normal range of motion. She exhibits no edema.  No arthritis, no gout  Lymphadenopathy:   She has no cervical adenopathy.  Neurological: She is alert and oriented to person, place, and time. No cranial nerve deficit.  No dysarthria, no aphasia  Skin: Skin is warm and dry. No rash  noted. No erythema.  Psychiatric: She has a normal mood and affect. Her behavior is normal. Judgment and thought content normal.   DATA REVIEW:   CBC  Recent Labs Lab 10/02/14 1954  WBC 8.3  HGB 13.8  HCT 39.5  PLT 202    Chemistries   Recent Labs Lab 10/02/14 1954  NA 137  K 3.5  CL 106  CO2 25  GLUCOSE 154*  BUN 18  CREATININE 0.78  CALCIUM 8.9    Cardiac Enzymes  Recent Labs Lab 10/03/14 1040  TROPONINI <0.03    Microbiology Results  Results for orders placed or performed in visit on 08/06/11  Urine culture     Status: None   Collection Time: 08/06/11  6:10 PM  Result Value Ref Range Status   Micro Text Report   Final       SOURCE: CC    ORGANISM 1                2000 CFU/ML GRAM NEGATIVE ROD   COMMENT                   WITH MIXED-BACTERIAL-ORGANISMS   COMMENT                   SUGGEST RECOLLECTION USING STERILE TECHNIQUE   ANTIBIOTIC                                                      Culture, blood (single)     Status: None   Collection Time: 08/06/11  7:54 PM  Result Value Ref Range Status   Micro Text Report   Final       COMMENT                    NO GROWTH AEROBICALLY/ANAEROBICALLY IN 5 DAYS   ANTIBIOTIC                                                      Culture, blood (single)     Status: None   Collection Time: 08/06/11  7:55 PM  Result Value Ref Range Status   Micro Text Report   Final       COMMENT                   NO GROWTH AEROBICALLY/ANAEROBICALLY IN 5 DAYS   ANTIBIOTIC                                                        RADIOLOGY:  Dg Chest 2 View  10/02/2014   CLINICAL DATA:  Chest tightness, chest pain which is tight and stabbing associated with shortness of breath and nausea, new back pain beginning 2 days ago, smoker  EXAM: CHEST  2 VIEW  COMPARISON:  04/28/2007  FINDINGS: Normal heart size, mediastinal contours, and pulmonary vascularity.  Atherosclerotic calcification aorta.  Minimal bronchitic changes without infiltrate, pleural effusion or pneumothorax.  Bones unremarkable.  IMPRESSION: Minimal bronchitic changes without infiltrate.  Electronically Signed   By: Lavonia Dana M.D.   On: 10/02/2014 20:21       Management plans discussed with the patient, family and they are in agreement.  CODE STATUS:     Code Status Orders        Start     Ordered   10/02/14 2344  Full code   Continuous     10/02/14 2343      TOTAL TIME TAKING CARE OF THIS PATIENT: 35 minutes.    Vaughan Basta M.D on 10/03/2014 at 12:53 PM  Between 7am to 6pm - Pager - [phone]  After 6pm go to [email or web address] - password [email or web address] [facility]  Office  [phone]  CC: Primary care physician; Lavera Guise, MD   Note: This dictation was prepared with Dragon dictation along with smaller phrase technology. Any transcriptional errors that result from this process are unintentional.

## 2015-12-26 ENCOUNTER — Other Ambulatory Visit: Payer: Self-pay | Admitting: Nurse Practitioner

## 2015-12-26 DIAGNOSIS — Z1231 Encounter for screening mammogram for malignant neoplasm of breast: Secondary | ICD-10-CM

## 2016-01-31 ENCOUNTER — Ambulatory Visit
Admission: RE | Admit: 2016-01-31 | Discharge: 2016-01-31 | Disposition: A | Discharge status: Home / Self Care | Payer: BLUE CROSS/BLUE SHIELD | Source: Ambulatory Visit | Attending: Nurse Practitioner | Admitting: Nurse Practitioner

## 2016-01-31 DIAGNOSIS — Z1231 Encounter for screening mammogram for malignant neoplasm of breast: Secondary | ICD-10-CM | POA: Diagnosis present

## 2016-02-13 DIAGNOSIS — Z87442 Personal history of urinary calculi: Secondary | ICD-10-CM | POA: Insufficient documentation

## 2016-02-13 DIAGNOSIS — T859XXA Unspecified complication of internal prosthetic device, implant and graft, initial encounter: Secondary | ICD-10-CM | POA: Insufficient documentation

## 2016-02-13 DIAGNOSIS — R339 Retention of urine, unspecified: Secondary | ICD-10-CM | POA: Insufficient documentation

## 2016-02-13 DIAGNOSIS — N3281 Overactive bladder: Secondary | ICD-10-CM | POA: Insufficient documentation

## 2016-02-13 DIAGNOSIS — R102 Pelvic and perineal pain: Secondary | ICD-10-CM | POA: Insufficient documentation

## 2017-01-07 ENCOUNTER — Encounter: Payer: Self-pay | Admitting: Nurse Practitioner

## 2017-01-07 ENCOUNTER — Ambulatory Visit: Payer: BLUE CROSS/BLUE SHIELD | Admitting: Nurse Practitioner

## 2017-01-07 VITALS — BP 141/87 | HR 97 | Resp 16 | Ht 66.0 in | Wt 162.6 lb

## 2017-01-07 DIAGNOSIS — F331 Major depressive disorder, recurrent, moderate: Secondary | ICD-10-CM | POA: Insufficient documentation

## 2017-01-07 DIAGNOSIS — E2839 Other primary ovarian failure: Secondary | ICD-10-CM | POA: Insufficient documentation

## 2017-01-07 DIAGNOSIS — R059 Cough, unspecified: Secondary | ICD-10-CM | POA: Insufficient documentation

## 2017-01-07 DIAGNOSIS — R300 Dysuria: Secondary | ICD-10-CM | POA: Insufficient documentation

## 2017-01-07 DIAGNOSIS — J019 Acute sinusitis, unspecified: Secondary | ICD-10-CM | POA: Insufficient documentation

## 2017-01-07 DIAGNOSIS — A09 Infectious gastroenteritis and colitis, unspecified: Secondary | ICD-10-CM | POA: Insufficient documentation

## 2017-01-07 DIAGNOSIS — E039 Hypothyroidism, unspecified: Secondary | ICD-10-CM

## 2017-01-07 DIAGNOSIS — N329 Bladder disorder, unspecified: Secondary | ICD-10-CM | POA: Insufficient documentation

## 2017-01-07 DIAGNOSIS — F17210 Nicotine dependence, cigarettes, uncomplicated: Secondary | ICD-10-CM | POA: Diagnosis not present

## 2017-01-07 DIAGNOSIS — Z1231 Encounter for screening mammogram for malignant neoplasm of breast: Secondary | ICD-10-CM | POA: Diagnosis not present

## 2017-01-07 DIAGNOSIS — F411 Generalized anxiety disorder: Secondary | ICD-10-CM

## 2017-01-07 DIAGNOSIS — R5383 Other fatigue: Secondary | ICD-10-CM | POA: Diagnosis not present

## 2017-01-07 DIAGNOSIS — R0602 Shortness of breath: Secondary | ICD-10-CM | POA: Insufficient documentation

## 2017-01-07 DIAGNOSIS — E559 Vitamin D deficiency, unspecified: Secondary | ICD-10-CM | POA: Diagnosis not present

## 2017-01-07 DIAGNOSIS — G4700 Insomnia, unspecified: Secondary | ICD-10-CM

## 2017-01-07 DIAGNOSIS — Z0001 Encounter for general adult medical examination with abnormal findings: Secondary | ICD-10-CM | POA: Diagnosis not present

## 2017-01-07 DIAGNOSIS — J209 Acute bronchitis, unspecified: Secondary | ICD-10-CM | POA: Insufficient documentation

## 2017-01-07 DIAGNOSIS — M545 Low back pain, unspecified: Secondary | ICD-10-CM | POA: Insufficient documentation

## 2017-01-07 DIAGNOSIS — E6609 Other obesity due to excess calories: Secondary | ICD-10-CM | POA: Insufficient documentation

## 2017-01-07 DIAGNOSIS — R05 Cough: Secondary | ICD-10-CM | POA: Insufficient documentation

## 2017-01-07 DIAGNOSIS — R062 Wheezing: Secondary | ICD-10-CM | POA: Insufficient documentation

## 2017-01-07 DIAGNOSIS — F1320 Sedative, hypnotic or anxiolytic dependence, uncomplicated: Secondary | ICD-10-CM | POA: Insufficient documentation

## 2017-01-07 DIAGNOSIS — R110 Nausea: Secondary | ICD-10-CM | POA: Insufficient documentation

## 2017-01-07 DIAGNOSIS — N200 Calculus of kidney: Secondary | ICD-10-CM | POA: Insufficient documentation

## 2017-01-07 DIAGNOSIS — Z1239 Encounter for other screening for malignant neoplasm of breast: Secondary | ICD-10-CM

## 2017-01-07 DIAGNOSIS — N3941 Urge incontinence: Secondary | ICD-10-CM | POA: Insufficient documentation

## 2017-01-07 DIAGNOSIS — R350 Frequency of micturition: Secondary | ICD-10-CM | POA: Insufficient documentation

## 2017-01-07 MED ORDER — DESVENLAFAXINE SUCCINATE ER 100 MG PO TB24: 100.0000 mg | ORAL_TABLET | Freq: Every day | ORAL | 3 refills | Status: DC

## 2017-01-07 MED ORDER — ALPRAZOLAM 0.5 MG PO TABS: 0.5000 mg | ORAL_TABLET | Freq: Two times a day (BID) | ORAL | 3 refills | Status: DC

## 2017-01-07 NOTE — Progress Notes (Signed)
Subjective:     Patient ID: Amanda Mcgee, female   DOB: 03-25-1955, 61 y.o.   MRN: [id number]  The patient is here for health maintenance exam. She is complaining of fatigue and difficulty sleeping. She states that feels like she is worrying at night, keeping her awake. After a poor night of sleep, she is irritable the next day, especially toward her grandchildren. She did start taking a second xanax at night for past 3 nights. States that she has really not noticed an improvement with increased dose. Did take ambien 5mg  in the past. This is when she was taking care of her mother and had to get up during the night, and caused her to fall. She would prefer not to take another medication like this.     Current Outpatient Medications:  .  albuterol (VENTOLIN HFA) 108 (90 Base) MCG/ACT inhaler, Inhale 2 puffs into the lungs every 6 (six) hours as needed for wheezing or shortness of breath., Disp: , Rfl:  .  ALPRAZolam (XANAX) 0.5 MG tablet, Take 0.5 mg by mouth 2 (two) times daily. Take 1 tablet in the morning and 2 tablets as needed to help sleep., Disp: , Rfl:  .  buPROPion (WELLBUTRIN SR) 200 MG 12 hr tablet, Take 1 tablet by mouth 2 (two) times daily., Disp: , Rfl:  .  calcium carbonate (CALCIUM 600) 600 MG TABS tablet, Take 600 mg by mouth 2 (two) times daily with a meal., Disp: , Rfl:  .  Cholecalciferol (VITAMIN D3) 400 units CAPS, Take by mouth daily., Disp: , Rfl:  .  CRANBERRY PO, Take 1 tablet by mouth 2 (two) times daily., Disp: , Rfl:  .  cyclobenzaprine (FLEXERIL) 10 MG tablet, Take 10 mg by mouth 2 (two) times daily as needed for muscle spasms., Disp: , Rfl:  .  desvenlafaxine (PRISTIQ) 50 MG 24 hr tablet, Take 50 mg by mouth daily., Disp: , Rfl:  .  docusate sodium (COLACE) 100 MG capsule, Take 100 mg by mouth 2 (two) times daily as needed for mild constipation., Disp: , Rfl:  .  fluticasone (FLONASE) 50 MCG/ACT nasal spray, Place 1 spray into both nostrils daily., Disp: , Rfl:  .   imipramine (TOFRANIL) 25 MG tablet, Take 25 mg by mouth at bedtime., Disp: , Rfl:  .  levocetirizine (XYZAL) 5 MG tablet, Take 1 tablet by mouth daily., Disp: , Rfl:  .  lisinopril (PRINIVIL,ZESTRIL) 10 MG tablet, Take 10 mg by mouth daily., Disp: , Rfl:  .  tamsulosin (FLOMAX) 0.4 MG CAPS capsule, Take 0.4 mg by mouth., Disp: , Rfl:  .  aspirin EC 81 MG EC tablet, Take 1 tablet (81 mg total) by mouth daily. (Patient not taking: Reported on 01/07/2017), Disp: 30 tablet, Rfl: 0 .  citalopram (CELEXA) 40 MG tablet, Take 1 tablet by mouth daily., Disp: , Rfl:  .  doxepin (SINEQUAN) 10 MG capsule, Take 10 mg by mouth at bedtime., Disp: , Rfl:  .  escitalopram (LEXAPRO) 20 MG tablet, Take 20 mg by mouth daily., Disp: , Rfl:  .  rosuvastatin (CRESTOR) 10 MG tablet, Take 1 tablet (10 mg total) by mouth daily at 6 PM. (Patient not taking: Reported on 01/07/2017), Disp: 30 tablet, Rfl: 0  Review of Systems  Constitutional: Positive for fatigue.  HENT: Negative.   Eyes: Negative.   Respiratory: Negative.  Negative for chest tightness and shortness of breath.   Cardiovascular: Negative for chest pain and palpitations.  Gastrointestinal: Negative.  Endocrine: Negative.   Genitourinary: Positive for frequency.       Now seeing Dr. Jacqlyn Larsen, urology. Is on imiprimine at night which has helped. She is getting up only one time per night to use the bathroom one time.   Musculoskeletal: Negative.   Skin: Negative.   Allergic/Immunologic: Negative.   Neurological: Negative.   Hematological: Negative.   Psychiatric/Behavioral: Negative.    Today's Vitals   01/07/17 0938  BP: (!) 141/87  Pulse: 97  Resp: 16  SpO2: 97%  Weight: 162 lb 9.6 oz (73.8 kg)  Height: 5\' 6"  (1.676 m)      Objective:   Physical Exam  Constitutional: She is oriented to person, place, and time. She appears well-developed and well-nourished.  HENT:  Head: Normocephalic and atraumatic.  Eyes: Pupils are equal, round, and  reactive to light.  Neck: Normal range of motion. Neck supple. Carotid bruit is not present. No thyromegaly present.  Cardiovascular: Normal rate, regular rhythm, normal heart sounds and intact distal pulses.  Pulmonary/Chest: Effort normal and breath sounds normal. She has no wheezes. She exhibits no tenderness.  Abdominal: Soft. Bowel sounds are normal. There is no tenderness.  Genitourinary: No breast swelling, tenderness, discharge or bleeding.  Musculoskeletal: Normal range of motion.  Lymphadenopathy:    She has no cervical adenopathy.  Neurological: She is alert and oriented to person, place, and time. No cranial nerve deficit.  Skin: Skin is warm and dry.  Psychiatric: She has a normal mood and affect.  Nursing note and vitals reviewed.      Assessment:     Encounter for health maintenance examination with abnormal findings - Plan: Urinalysis, Routine w reflex microscopic, CBC with Differential/Platelet, Comprehensive metabolic panel, Lipid panel, TSH, T4, free, Iron, TIBC and Ferritin Panel, Vitamin B12, Folate, MM DIGITAL SCREENING BILATERAL  Other fatigue - Plan: Iron, TIBC and Ferritin Panel, Vitamin B12, Folate  Major depressive disorder, recurrent, moderate (HCC) - Plan: desvenlafaxine (PRISTIQ) 100 MG 24 hr tablet  Insomnia, unspecified type  Generalized anxiety disorder - Plan: ALPRAZolam (XANAX) 0.5 MG tablet  Nicotine dependence, cigarettes, uncomplicated  Vitamin D deficiency, unspecified - Plan: Vitamin D 1,25 dihydroxy  Urge incontinence  Hypothyroidism, unspecified type - Plan: TSH, T4, free  Screening for breast cancer - Plan: MM DIGITAL SCREENING BILATERAL      Plan:     1. Annual wellness visit today 2. Check labs, including full anemia and thyroid panels for further evaluation 3. Increase pristiq to 100mg  daily. Reassess at next visit 4. Likely related to increased anxiety/depression. Increased pristiq to 100mg . Continue alprazoalm dosing without  change. Adjust as indicated 5. Continue alprazolam 0.5mg , takin 1 in morning and 2 in the evening. Reassess and adjust meds at next visit 6. Smoking cessation discussed  7. Check vitamin d level 8. Check thyroid panel and adjust levothyroxine as indicated  9. Screening mammogram ordered.     Follow up 3 months and sooner if needed

## 2017-01-08 LAB — MICROSCOPIC EXAMINATION: Casts: NONE SEEN /lpf

## 2017-01-08 LAB — URINALYSIS, ROUTINE W REFLEX MICROSCOPIC
Bilirubin, UA: NEGATIVE
Glucose, UA: NEGATIVE
KETONES UA: NEGATIVE
Leukocytes, UA: NEGATIVE
NITRITE UA: NEGATIVE
PH UA: 6 (ref 5.0–7.5)
Protein, UA: NEGATIVE
SPEC GRAV UA: 1.017 (ref 1.005–1.030)
Urobilinogen, Ur: 0.2 mg/dL (ref 0.2–1.0)

## 2017-01-20 ENCOUNTER — Telehealth: Payer: Self-pay

## 2017-01-20 ENCOUNTER — Other Ambulatory Visit: Payer: Self-pay | Admitting: Nurse Practitioner

## 2017-01-20 ENCOUNTER — Other Ambulatory Visit: Payer: Self-pay

## 2017-01-20 DIAGNOSIS — F411 Generalized anxiety disorder: Secondary | ICD-10-CM

## 2017-01-20 MED ORDER — ALPRAZOLAM 0.5 MG PO TABS: 0.5000 mg | ORAL_TABLET | Freq: Two times a day (BID) | ORAL | 3 refills | Status: DC

## 2017-01-20 NOTE — Progress Notes (Signed)
Sent rx for alprazolam to [facility] [address] per her request.

## 2017-01-20 NOTE — Telephone Encounter (Signed)
error 

## 2017-02-12 LAB — LIPID PANEL
CHOLESTEROL TOTAL: 235 mg/dL — AB (ref 100–199)
Chol/HDL Ratio: 4.1 ratio (ref 0.0–4.4)
HDL: 57 mg/dL (ref >39)
LDL CALC: 159 mg/dL — AB (ref 0–99)
Triglycerides: 96 mg/dL (ref 0–149)
VLDL CHOLESTEROL CAL: 19 mg/dL (ref 5–40)

## 2017-02-12 LAB — CBC WITH DIFFERENTIAL/PLATELET
BASOS ABS: 0 10*3/uL (ref 0.0–0.2)
BASOS: 0 %
EOS (ABSOLUTE): 0.1 10*3/uL (ref 0.0–0.4)
Eos: 1 %
HEMOGLOBIN: 13.8 g/dL (ref 11.1–15.9)
Hematocrit: 39.6 % (ref 34.0–46.6)
IMMATURE GRANS (ABS): 0 10*3/uL (ref 0.0–0.1)
Immature Granulocytes: 0 %
LYMPHS ABS: 1.6 10*3/uL (ref 0.7–3.1)
Lymphs: 27 %
MCH: 31.7 pg (ref 26.6–33.0)
MCHC: 34.8 g/dL (ref 31.5–35.7)
MCV: 91 fL (ref 79–97)
Monocytes Absolute: 0.5 10*3/uL (ref 0.1–0.9)
Monocytes: 9 %
NEUTROS ABS: 3.8 10*3/uL (ref 1.4–7.0)
NEUTROS PCT: 63 %
Platelets: 237 10*3/uL (ref 150–379)
RBC: 4.35 x10E6/uL (ref 3.77–5.28)
RDW: 12.1 % — ABNORMAL LOW (ref 12.3–15.4)
WBC: 5.9 10*3/uL (ref 3.4–10.8)

## 2017-02-12 LAB — COMPREHENSIVE METABOLIC PANEL
ALT: 17 IU/L (ref 0–32)
AST: 10 IU/L (ref 0–40)
Albumin/Globulin Ratio: 2 (ref 1.2–2.2)
Albumin: 4.5 g/dL (ref 3.6–4.8)
Alkaline Phosphatase: 91 IU/L (ref 39–117)
BUN/Creatinine Ratio: 11 — ABNORMAL LOW (ref 12–28)
BUN: 9 mg/dL (ref 8–27)
Bilirubin Total: 0.3 mg/dL (ref 0.0–1.2)
CALCIUM: 9.7 mg/dL (ref 8.7–10.3)
CO2: 26 mmol/L (ref 20–29)
CREATININE: 0.82 mg/dL (ref 0.57–1.00)
Chloride: 100 mmol/L (ref 96–106)
GFR, EST AFRICAN AMERICAN: 89 mL/min/{1.73_m2} (ref >59)
GFR, EST NON AFRICAN AMERICAN: 77 mL/min/{1.73_m2} (ref >59)
GLUCOSE: 92 mg/dL (ref 65–99)
Globulin, Total: 2.2 g/dL (ref 1.5–4.5)
Potassium: 4.3 mmol/L (ref 3.5–5.2)
Sodium: 142 mmol/L (ref 134–144)
TOTAL PROTEIN: 6.7 g/dL (ref 6.0–8.5)

## 2017-02-12 LAB — VITAMIN D 1,25 DIHYDROXY
Vitamin D 1, 25 (OH)2 Total: 35 pg/mL
Vitamin D2 1, 25 (OH)2: <10 pg/mL
Vitamin D3 1, 25 (OH)2: 32 pg/mL

## 2017-02-12 LAB — IRON,TIBC AND FERRITIN PANEL
Ferritin: 82 ng/mL (ref 15–150)
IRON SATURATION: 28 % (ref 15–55)
IRON: 77 ug/dL (ref 27–139)
TIBC: 272 ug/dL (ref 250–450)
UIBC: 195 ug/dL (ref 118–369)

## 2017-02-12 LAB — T4, FREE: Free T4: 0.93 ng/dL (ref 0.82–1.77)

## 2017-02-12 LAB — VITAMIN B12: Vitamin B-12: 452 pg/mL (ref 232–1245)

## 2017-02-12 LAB — TSH: TSH: 1.79 u[IU]/mL (ref 0.450–4.500)

## 2017-02-12 LAB — FOLATE: Folate: 9.3 ng/mL (ref >3.0)

## 2017-02-15 ENCOUNTER — Other Ambulatory Visit: Payer: Self-pay

## 2017-02-15 MED ORDER — LEVOCETIRIZINE DIHYDROCHLORIDE 5 MG PO TABS: 5.0000 mg | ORAL_TABLET | Freq: Every day | ORAL | 0 refills | Status: DC

## 2017-02-17 ENCOUNTER — Other Ambulatory Visit: Payer: Self-pay

## 2017-02-17 DIAGNOSIS — F331 Major depressive disorder, recurrent, moderate: Secondary | ICD-10-CM

## 2017-02-17 MED ORDER — DOXYCYCLINE HYCLATE 100 MG PO TABS: 100.0000 mg | ORAL_TABLET | Freq: Two times a day (BID) | ORAL | 0 refills | Status: DC

## 2017-02-17 MED ORDER — DESVENLAFAXINE SUCCINATE ER 100 MG PO TB24: 100.0000 mg | ORAL_TABLET | Freq: Every day | ORAL | 3 refills | Status: DC

## 2017-03-10 ENCOUNTER — Ambulatory Visit
Admission: RE | Admit: 2017-03-10 | Discharge: 2017-03-10 | Disposition: A | Discharge status: Home / Self Care | Payer: BLUE CROSS/BLUE SHIELD | Source: Ambulatory Visit | Attending: Nurse Practitioner | Admitting: Nurse Practitioner

## 2017-03-10 DIAGNOSIS — Z1231 Encounter for screening mammogram for malignant neoplasm of breast: Secondary | ICD-10-CM | POA: Insufficient documentation

## 2017-03-10 DIAGNOSIS — Z1239 Encounter for other screening for malignant neoplasm of breast: Secondary | ICD-10-CM

## 2017-03-10 DIAGNOSIS — Z0001 Encounter for general adult medical examination with abnormal findings: Secondary | ICD-10-CM

## 2017-03-23 ENCOUNTER — Other Ambulatory Visit: Payer: Self-pay

## 2017-03-23 DIAGNOSIS — F331 Major depressive disorder, recurrent, moderate: Secondary | ICD-10-CM

## 2017-03-23 MED ORDER — DESVENLAFAXINE SUCCINATE ER 100 MG PO TB24: 100.0000 mg | ORAL_TABLET | Freq: Every day | ORAL | 1 refills | Status: DC

## 2017-04-02 ENCOUNTER — Encounter: Payer: Self-pay | Admitting: Nurse Practitioner

## 2017-04-02 ENCOUNTER — Ambulatory Visit: Payer: BLUE CROSS/BLUE SHIELD | Admitting: Nurse Practitioner

## 2017-04-02 VITALS — BP 138/88 | HR 95 | Resp 16 | Ht 65.0 in | Wt 164.6 lb

## 2017-04-02 DIAGNOSIS — N329 Bladder disorder, unspecified: Secondary | ICD-10-CM | POA: Diagnosis not present

## 2017-04-02 DIAGNOSIS — I10 Essential (primary) hypertension: Secondary | ICD-10-CM | POA: Diagnosis not present

## 2017-04-02 DIAGNOSIS — F411 Generalized anxiety disorder: Secondary | ICD-10-CM

## 2017-04-02 DIAGNOSIS — F331 Major depressive disorder, recurrent, moderate: Secondary | ICD-10-CM

## 2017-04-02 MED ORDER — DESVENLAFAXINE SUCCINATE ER 100 MG PO TB24: 100.0000 mg | ORAL_TABLET | Freq: Every day | ORAL | 1 refills | Status: DC

## 2017-04-02 MED ORDER — ALPRAZOLAM 0.5 MG PO TABS: 0.5000 mg | ORAL_TABLET | Freq: Two times a day (BID) | ORAL | 3 refills | Status: DC

## 2017-04-02 NOTE — Progress Notes (Signed)
[facility] [place], [place] [address]  Internal MEDICINE  Office Visit Note  Patient Name: Amanda Mcgee  [id number]  [id number]  Date of Service: 04/25/2017  Chief Complaint  Patient presents with  . Hypertension    Hypertension  This is a chronic problem. The current episode started more than 1 year ago. The problem is unchanged. The problem is controlled. Associated symptoms include anxiety, headaches and malaise/fatigue. Pertinent negatives include no chest pain, palpitations or shortness of breath. There are no associated agents to hypertension. Risk factors for coronary artery disease include post-menopausal state. Past treatments include ACE inhibitors. The current treatment provides moderate improvement. There are no compliance problems.     Pt is here for routine follow up.    Current Medication: Outpatient Encounter Medications as of 04/02/2017  Medication Sig  . albuterol (VENTOLIN HFA) 108 (90 Base) MCG/ACT inhaler Inhale 2 puffs into the lungs every 6 (six) hours as needed for wheezing or shortness of breath.  . ALPRAZolam (XANAX) 0.5 MG tablet Take 1 tablet (0.5 mg total) by mouth 2 (two) times daily. Take 1 tablet in the morning and 2 tablets po QHS prn  . [DISCONTINUED] ALPRAZolam (XANAX) 0.5 MG tablet Take 1 tablet (0.5 mg total) by mouth 2 (two) times daily. Take 1 tablet in the morning and 2 tablets po QHS prn  . buPROPion (WELLBUTRIN SR) 200 MG 12 hr tablet Take 1 tablet by mouth 2 (two) times daily.  . calcium carbonate (CALCIUM 600) 600 MG TABS tablet Take 600 mg by mouth 2 (two) times daily with a meal.  . Cholecalciferol (VITAMIN D3) 400 units CAPS Take by mouth daily.  Marland Kitchen CRANBERRY PO Take 1 tablet by mouth 2 (two) times daily.  . cyclobenzaprine (FLEXERIL) 10 MG tablet Take 10 mg by mouth 2 (two) times daily as needed for muscle spasms.  Marland Kitchen desvenlafaxine (PRISTIQ) 100 MG 24 hr tablet Take 1 tablet (100 mg total) by mouth daily.  Marland Kitchen  docusate sodium (COLACE) 100 MG capsule Take 100 mg by mouth 2 (two) times daily as needed for mild constipation.  Marland Kitchen doxycycline (VIBRA-TABS) 100 MG tablet Take 1 tablet (100 mg total) by mouth 2 (two) times daily.  . fluticasone (FLONASE) 50 MCG/ACT nasal spray Place 1 spray into both nostrils daily.  Marland Kitchen imipramine (TOFRANIL) 25 MG tablet Take 25 mg by mouth at bedtime.  Marland Kitchen levocetirizine (XYZAL) 5 MG tablet Take 1 tablet (5 mg total) by mouth daily.  Marland Kitchen lisinopril (PRINIVIL,ZESTRIL) 10 MG tablet Take 10 mg by mouth daily.  . tamsulosin (FLOMAX) 0.4 MG CAPS capsule Take 0.4 mg by mouth.  . [DISCONTINUED] aspirin EC 81 MG EC tablet Take 1 tablet (81 mg total) by mouth daily. (Patient not taking: Reported on 01/07/2017)  . [DISCONTINUED] desvenlafaxine (PRISTIQ) 100 MG 24 hr tablet Take 1 tablet (100 mg total) by mouth daily.  . [DISCONTINUED] rosuvastatin (CRESTOR) 10 MG tablet Take 1 tablet (10 mg total) by mouth daily at 6 PM. (Patient not taking: Reported on 01/07/2017)   No facility-administered encounter medications on file as of 04/02/2017.     Surgical History: Past Surgical History:  Procedure Laterality Date  . ABDOMINAL HYSTERECTOMY  1990  . APPENDECTOMY  1969  . BLADDER SURGERY  2011  . BREAST BIOPSY Left    benign  . LITHOTRIPSY  06/2011    Medical History: Past Medical History:  Diagnosis Date  . Anxiety   . Arthritis   . Depression   .  HTN (hypertension)   . Hyperlipidemia   . Kidney stones 2013    Family History: Family History  Problem Relation Age of Onset  . Breast cancer Mother 66    Social History   Socioeconomic History  . Marital status: Married    Spouse name: Not on file  . Number of children: Not on file  . Years of education: Not on file  . Highest education level: Not on file  Occupational History  . Not on file  Social Needs  . Financial resource strain: Not on file  . Food insecurity:    Worry: Not on file    Inability: Not on file  .  Transportation needs:    Medical: Not on file    Non-medical: Not on file  Tobacco Use  . Smoking status: Current Every Day Smoker    Packs/day: 0.50    Years: 30.00    Pack years: 15.00    Types: Cigarettes  . Smokeless tobacco: Never Used  Substance and Sexual Activity  . Alcohol use: Yes    Comment: ocassionally  . Drug use: No  . Sexual activity: Never  Lifestyle  . Physical activity:    Days per week: Not on file    Minutes per session: Not on file  . Stress: Not on file  Relationships  . Social connections:    Talks on phone: Not on file    Gets together: Not on file    Attends religious service: Not on file    Active member of club or organization: Not on file    Attends meetings of clubs or organizations: Not on file    Relationship status: Not on file  . Intimate partner violence:    Fear of current or ex partner: Not on file    Emotionally abused: Not on file    Physically abused: Not on file    Forced sexual activity: Not on file  Other Topics Concern  . Not on file  Social History Narrative  . Not on file      Review of Systems  Constitutional: Positive for fatigue and malaise/fatigue.  HENT: Negative for congestion, nosebleeds, postnasal drip, sinus pressure, sinus pain and sore throat.   Eyes: Negative.   Respiratory: Negative for chest tightness, shortness of breath and wheezing.   Cardiovascular: Negative for chest pain and palpitations.  Gastrointestinal: Negative.  Negative for constipation, diarrhea, nausea and vomiting.  Endocrine: Negative for cold intolerance, heat intolerance, polydipsia, polyphagia and polyuria.  Genitourinary: Positive for frequency.       Now seeing Dr. Jacqlyn Larsen, urology. Is on imiprimine at night which has helped. She is getting up only one time per night to use the bathroom one time.   Musculoskeletal: Negative for arthralgias and myalgias.  Skin: Negative for rash.  Allergic/Immunologic: Positive for environmental  allergies.  Neurological: Positive for headaches. Negative for dizziness and weakness.  Hematological: Negative.   Psychiatric/Behavioral: Positive for dysphoric mood and sleep disturbance. The patient is nervous/anxious.    Today's Vitals   04/02/17 0910 04/02/17 1035  BP: (!) 142/92 138/88  Pulse: 95   Resp: 16   SpO2: 96%   Weight: 164 lb 9.6 oz (74.7 kg)   Height: 5\' 5"  (1.651 m)     Physical Exam  Constitutional: She is oriented to person, place, and time. She appears well-developed and well-nourished.  HENT:  Head: Normocephalic and atraumatic.  Eyes: Pupils are equal, round, and reactive to light. EOM are normal.  Neck:  Normal range of motion. Neck supple. Carotid bruit is not present. No thyromegaly present.  Cardiovascular: Normal rate, regular rhythm, normal heart sounds and intact distal pulses.  Pulmonary/Chest: Effort normal and breath sounds normal. She has no wheezes. She exhibits no tenderness. No breast swelling, tenderness, discharge or bleeding.  Abdominal: Soft. Bowel sounds are normal. There is no tenderness.  Genitourinary: No breast swelling, tenderness, discharge or bleeding.  Musculoskeletal: Normal range of motion.  Lymphadenopathy:    She has no cervical adenopathy.  Neurological: She is alert and oriented to person, place, and time. No cranial nerve deficit.  Skin: Skin is warm and dry.  Psychiatric: She has a normal mood and affect. Her behavior is normal. Thought content normal.  Nursing note and vitals reviewed.  Assessment/Plan: 1. Essential hypertension Stable. Continue bp medication as prescribed   2. Generalized anxiety disorder May continue to take alprazolam 0.5mg  tablets, one in the daytime if needed and two at bedtime if needed for anxiety/insomnia.  - ALPRAZolam (XANAX) 0.5 MG tablet; Take 1 tablet (0.5 mg total) by mouth 2 (two) times daily. Take 1 tablet in the morning and 2 tablets po QHS prn  Dispense: 90 tablet; Refill: 3  3. Major  depressive disorder, recurrent, moderate (HCC) Doing well. Continue pristiq 100mg  daily  - desvenlafaxine (PRISTIQ) 100 MG 24 hr tablet; Take 1 tablet (100 mg total) by mouth daily.  Dispense: 90 tablet; Refill: 1  4. Bladder disorder, unspecified Continue regular visits with Dr. Jacqlyn Larsen as scheduled.    General Counseling: teryn boerema understanding of the findings of todays visit and agrees with plan of treatment. I have discussed any further diagnostic evaluation that may be needed or ordered today. We also reviewed her medications today. she has been encouraged to call the office with any questions or concerns that should arise related to todays visit.   This patient was seen by Leretha Pol, FNP- C in Collaboration with Dr Lavera Guise as a part of collaborative care agreement  Meds ordered this encounter  Medications  . ALPRAZolam (XANAX) 0.5 MG tablet    Sig: Take 1 tablet (0.5 mg total) by mouth 2 (two) times daily. Take 1 tablet in the morning and 2 tablets po QHS prn    Dispense:  90 tablet    Refill:  3    Order Specific Question:   Supervising Provider    Answer:   Lavera Guise [[id number]]  . desvenlafaxine (PRISTIQ) 100 MG 24 hr tablet    Sig: Take 1 tablet (100 mg total) by mouth daily.    Dispense:  90 tablet    Refill:  1    Ok to fill as 90 day prescription    Order Specific Question:   Supervising Provider    Answer:   Lavera Guise [[id number]]    Time spent: 28 Minutes          Dr Lavera Guise Internal medicine

## 2017-04-14 DIAGNOSIS — R3129 Other microscopic hematuria: Secondary | ICD-10-CM | POA: Insufficient documentation

## 2017-05-12 ENCOUNTER — Other Ambulatory Visit: Payer: Self-pay

## 2017-05-12 MED ORDER — LEVOCETIRIZINE DIHYDROCHLORIDE 5 MG PO TABS: 5.0000 mg | ORAL_TABLET | Freq: Every day | ORAL | 1 refills | Status: DC

## 2017-05-19 DIAGNOSIS — D3500 Benign neoplasm of unspecified adrenal gland: Secondary | ICD-10-CM | POA: Insufficient documentation

## 2017-05-19 DIAGNOSIS — N895 Stricture and atresia of vagina: Secondary | ICD-10-CM | POA: Insufficient documentation

## 2017-06-29 ENCOUNTER — Other Ambulatory Visit: Payer: Self-pay

## 2017-06-29 MED ORDER — FLUTICASONE PROPIONATE 50 MCG/ACT NA SUSP: 1.0000 | Freq: Every day | NASAL | 5 refills | Status: DC

## 2017-07-05 ENCOUNTER — Ambulatory Visit: Payer: Self-pay | Admitting: Nurse Practitioner

## 2017-08-03 ENCOUNTER — Ambulatory Visit: Payer: BLUE CROSS/BLUE SHIELD | Admitting: Nurse Practitioner

## 2017-08-03 ENCOUNTER — Encounter: Payer: Self-pay | Admitting: Nurse Practitioner

## 2017-08-03 VITALS — BP 140/80 | HR 72 | Resp 16 | Ht 65.5 in | Wt 162.0 lb

## 2017-08-03 DIAGNOSIS — K581 Irritable bowel syndrome with constipation: Secondary | ICD-10-CM

## 2017-08-03 DIAGNOSIS — F411 Generalized anxiety disorder: Secondary | ICD-10-CM

## 2017-08-03 DIAGNOSIS — J300 Vasomotor rhinitis: Secondary | ICD-10-CM

## 2017-08-03 DIAGNOSIS — Z0001 Encounter for general adult medical examination with abnormal findings: Secondary | ICD-10-CM | POA: Diagnosis not present

## 2017-08-03 DIAGNOSIS — N329 Bladder disorder, unspecified: Secondary | ICD-10-CM

## 2017-08-03 DIAGNOSIS — I10 Essential (primary) hypertension: Secondary | ICD-10-CM

## 2017-08-03 DIAGNOSIS — J309 Allergic rhinitis, unspecified: Secondary | ICD-10-CM

## 2017-08-03 MED ORDER — LEVOCETIRIZINE DIHYDROCHLORIDE 5 MG PO TABS: 5.0000 mg | ORAL_TABLET | Freq: Every day | ORAL | 4 refills | Status: DC

## 2017-08-03 MED ORDER — LISINOPRIL 10 MG PO TABS: 10.0000 mg | ORAL_TABLET | Freq: Every day | ORAL | 4 refills | Status: DC

## 2017-08-03 MED ORDER — ALPRAZOLAM 0.5 MG PO TABS: ORAL_TABLET | ORAL | 3 refills | Status: DC

## 2017-08-03 MED ORDER — LUBIPROSTONE 8 MCG PO CAPS: 8.0000 ug | ORAL_CAPSULE | Freq: Two times a day (BID) | ORAL | 3 refills | Status: DC

## 2017-08-03 MED ORDER — FLUTICASONE PROPIONATE 50 MCG/ACT NA SUSP: 1.0000 | Freq: Every day | NASAL | 5 refills | Status: DC

## 2017-08-03 MED ORDER — BUPROPION HCL ER (SR) 200 MG PO TB12: 200.0000 mg | ORAL_TABLET | Freq: Two times a day (BID) | ORAL | 5 refills | Status: DC

## 2017-08-03 NOTE — Progress Notes (Signed)
[facility] [place], [place] [address]  Internal MEDICINE  Office Visit Note  Patient Name: Amanda Mcgee  [id number]  [id number]  Date of Service: 08/22/2017  Chief Complaint  Patient presents with  . Depression  . Quality Metric Gaps    colonoscopy    Hypertension  This is a chronic problem. The current episode started more than 1 year ago. The problem is unchanged. The problem is controlled. Associated symptoms include anxiety, headaches and malaise/fatigue. Pertinent negatives include no chest pain, palpitations or shortness of breath. There are no associated agents to hypertension. Risk factors for coronary artery disease include post-menopausal state. Past treatments include ACE inhibitors. The current treatment provides moderate improvement. There are no compliance problems.        Current Medication: Outpatient Encounter Medications as of 08/03/2017  Medication Sig  . albuterol (VENTOLIN HFA) 108 (90 Base) MCG/ACT inhaler Inhale 2 puffs into the lungs every 6 (six) hours as needed for wheezing or shortness of breath.  . ALPRAZolam (XANAX) 0.5 MG tablet Take 1 tablet in the morning and 2 tablets po QHS prn  . buPROPion (WELLBUTRIN SR) 200 MG 12 hr tablet Take 1 tablet (200 mg total) by mouth 2 (two) times daily.  . calcium carbonate (CALCIUM 600) 600 MG TABS tablet Take 600 mg by mouth 2 (two) times daily with a meal.  . Cholecalciferol (VITAMIN D3) 400 units CAPS Take by mouth daily.  Marland Kitchen CRANBERRY PO Take 1 tablet by mouth 2 (two) times daily.  . cyclobenzaprine (FLEXERIL) 10 MG tablet Take 10 mg by mouth 2 (two) times daily as needed for muscle spasms.  Marland Kitchen desvenlafaxine (PRISTIQ) 100 MG 24 hr tablet Take 1 tablet (100 mg total) by mouth daily.  Marland Kitchen docusate sodium (COLACE) 100 MG capsule Take 100 mg by mouth 2 (two) times daily as needed for mild constipation.  . fluticasone (FLONASE) 50 MCG/ACT nasal spray Place 1 spray into both nostrils daily.  Marland Kitchen  imipramine (TOFRANIL) 25 MG tablet Take 25 mg by mouth at bedtime.  Marland Kitchen levocetirizine (XYZAL) 5 MG tablet Take 1 tablet (5 mg total) by mouth daily.  Marland Kitchen lisinopril (PRINIVIL,ZESTRIL) 10 MG tablet Take 1 tablet (10 mg total) by mouth daily.  . tamsulosin (FLOMAX) 0.4 MG CAPS capsule Take 0.4 mg by mouth.  . [DISCONTINUED] ALPRAZolam (XANAX) 0.5 MG tablet Take 1 tablet (0.5 mg total) by mouth 2 (two) times daily. Take 1 tablet in the morning and 2 tablets po QHS prn  . [DISCONTINUED] buPROPion (WELLBUTRIN SR) 200 MG 12 hr tablet Take 1 tablet by mouth 2 (two) times daily.  . [DISCONTINUED] fluticasone (FLONASE) 50 MCG/ACT nasal spray Place 1 spray into both nostrils daily.  . [DISCONTINUED] levocetirizine (XYZAL) 5 MG tablet Take 1 tablet (5 mg total) by mouth daily.  . [DISCONTINUED] lisinopril (PRINIVIL,ZESTRIL) 10 MG tablet Take 10 mg by mouth daily.  Marland Kitchen lubiprostone (AMITIZA) 8 MCG capsule Take 1 capsule (8 mcg total) by mouth 2 (two) times daily with a meal.  . [DISCONTINUED] doxycycline (VIBRA-TABS) 100 MG tablet Take 1 tablet (100 mg total) by mouth 2 (two) times daily.   No facility-administered encounter medications on file as of 08/03/2017.     Surgical History: Past Surgical History:  Procedure Laterality Date  . ABDOMINAL HYSTERECTOMY  1990  . APPENDECTOMY  1969  . BLADDER SURGERY  2011  . BREAST BIOPSY Left    benign  . LITHOTRIPSY  06/2011    Medical History: Past  Medical History:  Diagnosis Date  . Anxiety   . Arthritis   . Depression   . HTN (hypertension)   . Hyperlipidemia   . Kidney stones 2013    Family History: Family History  Problem Relation Age of Onset  . Breast cancer Mother 63    Social History   Socioeconomic History  . Marital status: Married    Spouse name: Not on file  . Number of children: Not on file  . Years of education: Not on file  . Highest education level: Not on file  Occupational History  . Not on file  Social Needs  . Financial  resource strain: Not on file  . Food insecurity:    Worry: Not on file    Inability: Not on file  . Transportation needs:    Medical: Not on file    Non-medical: Not on file  Tobacco Use  . Smoking status: Current Every Day Smoker    Packs/day: 0.50    Years: 30.00    Pack years: 15.00    Types: Cigarettes  . Smokeless tobacco: Never Used  Substance and Sexual Activity  . Alcohol use: Yes    Comment: ocassionally  . Drug use: No  . Sexual activity: Never  Lifestyle  . Physical activity:    Days per week: Not on file    Minutes per session: Not on file  . Stress: Not on file  Relationships  . Social connections:    Talks on phone: Not on file    Gets together: Not on file    Attends religious service: Not on file    Active member of club or organization: Not on file    Attends meetings of clubs or organizations: Not on file    Relationship status: Not on file  . Intimate partner violence:    Fear of current or ex partner: Not on file    Emotionally abused: Not on file    Physically abused: Not on file    Forced sexual activity: Not on file  Other Topics Concern  . Not on file  Social History Narrative  . Not on file      Review of Systems  Constitutional: Positive for fatigue and malaise/fatigue. Negative for activity change and unexpected weight change.  HENT: Negative for congestion, nosebleeds, postnasal drip, sinus pressure, sinus pain and sore throat.   Eyes: Negative.   Respiratory: Negative for chest tightness, shortness of breath and wheezing.   Cardiovascular: Negative for chest pain and palpitations.  Gastrointestinal: Negative for constipation, diarrhea, nausea and vomiting.  Endocrine: Negative for cold intolerance, heat intolerance, polydipsia, polyphagia and polyuria.  Genitourinary: Positive for frequency and urgency.       Now seeing Dr. Jacqlyn Larsen, urology. Is on imiprimine at night which has helped. She is getting up only one time per night to use the  bathroom one time.   Musculoskeletal: Negative for arthralgias and myalgias.  Skin: Negative for rash.  Allergic/Immunologic: Positive for environmental allergies.  Neurological: Positive for headaches. Negative for dizziness and weakness.  Hematological: Negative for adenopathy.  Psychiatric/Behavioral: Positive for dysphoric mood and sleep disturbance. The patient is nervous/anxious.     Today's Vitals   08/03/17 0935  BP: 140/80  Pulse: 72  Resp: 16  SpO2: 98%  Weight: 162 lb (73.5 kg)  Height: 5' 5.5" (1.664 m)     Physical Exam  Constitutional: She is oriented to person, place, and time. She appears well-developed and well-nourished.  HENT:  Head: Normocephalic and atraumatic.  Nose: Nose normal.  Mouth/Throat: Oropharynx is clear and moist.  Eyes: Pupils are equal, round, and reactive to light. Conjunctivae and EOM are normal.  Neck: Normal range of motion. Neck supple. No JVD present. Carotid bruit is not present. No tracheal deviation present. No thyromegaly present.  Cardiovascular: Normal rate, regular rhythm, normal heart sounds and intact distal pulses.  Pulmonary/Chest: Effort normal and breath sounds normal. She has no wheezes. She exhibits no tenderness. No breast tenderness, discharge or bleeding.  Abdominal: Soft. Bowel sounds are normal. There is no tenderness.  Genitourinary: No breast tenderness, discharge or bleeding.  Musculoskeletal: Normal range of motion.  Lymphadenopathy:    She has no cervical adenopathy.  Neurological: She is alert and oriented to person, place, and time. No cranial nerve deficit.  Skin: Skin is warm and dry. Capillary refill takes less than 2 seconds.  Psychiatric: She has a normal mood and affect. Her behavior is normal. Thought content normal.  Nursing note and vitals reviewed.  Assessment/Plan: 1. Encounter for general adult medical examination with abnormal findings Annual wellness visit today.   2. Bladder disorder,  unspecified Needs new referral to urology as her urologist has moved out of town. New refferral provided today. - Ambulatory referral to Urology  3. Essential hypertension Stable. Continue lisinopril as prescribed.  - lisinopril (PRINIVIL,ZESTRIL) 10 MG tablet; Take 1 tablet (10 mg total) by mouth daily.  Dispense: 90 tablet; Refill: 4  4. Irritable bowel syndrome with constipation - lubiprostone (AMITIZA) 8 MCG capsule; Take 1 capsule (8 mcg total) by mouth 2 (two) times daily with a meal.  Dispense: 60 capsule; Refill: 3  5. Generalized anxiety disorder Well managed. Continue bupropion as prescribed. May continue alprazolam 0.5mg  twice daily as needed for acute anxiety. New prescription provided today. - ALPRAZolam (XANAX) 0.5 MG tablet; Take 1 tablet in the morning and 2 tablets po QHS prn  Dispense: 90 tablet; Refill: 3 - buPROPion (WELLBUTRIN SR) 200 MG 12 hr tablet; Take 1 tablet (200 mg total) by mouth 2 (two) times daily.  Dispense: 180 tablet; Refill: 5  6. Vasomotor rhinitis - fluticasone (FLONASE) 50 MCG/ACT nasal spray; Place 1 spray into both nostrils daily.  Dispense: 16 g; Refill: 5  7. Allergic rhinitis, unspecified seasonality, unspecified trigger - levocetirizine (XYZAL) 5 MG tablet; Take 1 tablet (5 mg total) by mouth daily.  Dispense: 90 tablet; Refill: 4  General Counseling: Chalon verbalizes understanding of the findings of todays visit and agrees with plan of treatment. I have discussed any further diagnostic evaluation that may be needed or ordered today. We also reviewed her medications today. she has been encouraged to call the office with any questions or concerns that should arise related to todays visit.  Hypertension Counseling:   The following hypertensive lifestyle modification were recommended and discussed:  1. Limiting alcohol intake to less than 1 oz/day of ethanol:(24 oz of beer or 8 oz of wine or 2 oz of 100-proof whiskey). 2. Take baby ASA 81 mg  daily. 3. Importance of regular aerobic exercise and losing weight. 4. Reduce dietary saturated fat and cholesterol intake for overall cardiovascular health. 5. Maintaining adequate dietary potassium, calcium, and magnesium intake. 6. Regular monitoring of the blood pressure. 7. Reduce sodium intake to less than 100 mmol/day (less than 2.3 gm of sodium or less than 6 gm of sodium choride)   This patient was seen by [place] with Dr Lavera Guise as a part  of collaborative care agreement   Orders Placed This Encounter  Procedures  . Ambulatory referral to Urology    Meds ordered this encounter  Medications  . lubiprostone (AMITIZA) 8 MCG capsule    Sig: Take 1 capsule (8 mcg total) by mouth 2 (two) times daily with a meal.    Dispense:  60 capsule    Refill:  3    Samples provided today    Order Specific Question:   Supervising Provider    Answer:   Lavera Guise [place]  . ALPRAZolam (XANAX) 0.5 MG tablet    Sig: Take 1 tablet in the morning and 2 tablets po QHS prn    Dispense:  90 tablet    Refill:  3    Order Specific Question:   Supervising Provider    Answer:   Lavera Guise [place]  . lisinopril (PRINIVIL,ZESTRIL) 10 MG tablet    Sig: Take 1 tablet (10 mg total) by mouth daily.    Dispense:  90 tablet    Refill:  4    Order Specific Question:   Supervising Provider    Answer:   Lavera Guise [[id number]]  . levocetirizine (XYZAL) 5 MG tablet    Sig: Take 1 tablet (5 mg total) by mouth daily.    Dispense:  90 tablet    Refill:  4    Order Specific Question:   Supervising Provider    Answer:   Lavera Guise [[id number]]  . fluticasone (FLONASE) 50 MCG/ACT nasal spray    Sig: Place 1 spray into both nostrils daily.    Dispense:  16 g    Refill:  5    Order Specific Question:   Supervising Provider    Answer:   Lavera Guise [[id number]]  . buPROPion (WELLBUTRIN SR) 200 MG 12 hr tablet    Sig: Take 1 tablet (200 mg total) by mouth 2 (two) times daily.    Dispense:   180 tablet    Refill:  5    Order Specific Question:   Supervising Provider    Answer:   Lavera Guise [[id number]]    Time spent: 21 Minutes      Dr Lavera Guise Internal medicine

## 2017-08-22 DIAGNOSIS — K581 Irritable bowel syndrome with constipation: Secondary | ICD-10-CM | POA: Insufficient documentation

## 2017-08-22 DIAGNOSIS — Z0001 Encounter for general adult medical examination with abnormal findings: Principal | ICD-10-CM

## 2017-08-22 DIAGNOSIS — Z1239 Encounter for other screening for malignant neoplasm of breast: Secondary | ICD-10-CM | POA: Insufficient documentation

## 2017-08-22 DIAGNOSIS — J300 Vasomotor rhinitis: Secondary | ICD-10-CM | POA: Insufficient documentation

## 2017-08-22 DIAGNOSIS — J309 Allergic rhinitis, unspecified: Secondary | ICD-10-CM | POA: Insufficient documentation

## 2017-08-24 ENCOUNTER — Other Ambulatory Visit: Payer: Self-pay | Admitting: Internal Medicine

## 2017-09-29 ENCOUNTER — Ambulatory Visit: Payer: Self-pay | Admitting: Urology

## 2017-10-01 ENCOUNTER — Other Ambulatory Visit: Payer: Self-pay | Admitting: Nurse Practitioner

## 2017-10-01 DIAGNOSIS — F331 Major depressive disorder, recurrent, moderate: Secondary | ICD-10-CM

## 2017-10-01 MED ORDER — DESVENLAFAXINE SUCCINATE ER 100 MG PO TB24: 100.0000 mg | ORAL_TABLET | Freq: Every day | ORAL | 1 refills | Status: DC

## 2017-10-04 ENCOUNTER — Other Ambulatory Visit: Payer: Self-pay

## 2017-10-05 ENCOUNTER — Telehealth: Payer: Self-pay

## 2017-10-05 ENCOUNTER — Encounter: Payer: Self-pay | Admitting: Nurse Practitioner

## 2017-10-05 LAB — COLOGUARD: COLOGUARD: NEGATIVE

## 2017-10-05 NOTE — Progress Notes (Signed)
SCANNED IN COLOGUARD RESULTS.

## 2017-10-05 NOTE — Telephone Encounter (Signed)
Pt advised cologard came back positive we going do referral  For GI and gave beth for referral

## 2017-10-07 ENCOUNTER — Other Ambulatory Visit: Payer: Self-pay

## 2017-10-13 ENCOUNTER — Ambulatory Visit: Payer: Self-pay | Admitting: Urology

## 2017-10-18 ENCOUNTER — Telehealth: Payer: Self-pay | Admitting: Gastroenterology

## 2017-10-18 NOTE — Telephone Encounter (Signed)
Patient called & would like a consult before have a colonoscopy scheduled for 11-09-17 with Dr Vicente Males. I have that appointment scheduled.

## 2017-10-29 ENCOUNTER — Encounter: Payer: Self-pay | Admitting: Adult Health

## 2017-10-29 ENCOUNTER — Ambulatory Visit: Payer: BLUE CROSS/BLUE SHIELD | Admitting: Nurse Practitioner

## 2017-10-29 VITALS — BP 112/62 | HR 91 | Temp 98.6°F | Resp 16 | Ht 65.5 in | Wt 156.0 lb

## 2017-10-29 DIAGNOSIS — R05 Cough: Secondary | ICD-10-CM | POA: Diagnosis not present

## 2017-10-29 DIAGNOSIS — J069 Acute upper respiratory infection, unspecified: Secondary | ICD-10-CM

## 2017-10-29 DIAGNOSIS — R059 Cough, unspecified: Secondary | ICD-10-CM

## 2017-10-29 MED ORDER — PROMETHAZINE-DM 6.25-15 MG/5ML PO SOLN: 5.0000 mL | Freq: Two times a day (BID) | ORAL | 0 refills | Status: DC | PRN

## 2017-10-29 MED ORDER — DOXYCYCLINE HYCLATE 100 MG PO TABS: 100.0000 mg | ORAL_TABLET | Freq: Two times a day (BID) | ORAL | 0 refills | Status: DC

## 2017-10-29 NOTE — Progress Notes (Signed)
[facility] [place], [place] [address]  Internal MEDICINE  Office Visit Note  Patient Name: Amanda Mcgee  [id number]  [id number]  Date of Service: 10/31/2017   Pt is here for a sick visit.  Chief Complaint  Patient presents with  . Cough  . Headache  . Ear Pain  . Dizziness     Cough  This is a new problem. The current episode started in the past 7 days. The problem has been gradually worsening. The problem occurs every few minutes. The cough is non-productive. Associated symptoms include chills, ear congestion, ear pain, a fever, headaches, myalgias, nasal congestion, postnasal drip, rhinorrhea, a sore throat and wheezing. Pertinent negatives include no chest pain. The symptoms are aggravated by exercise, pollens and stress. Risk factors for lung disease include smoking/tobacco exposure. She has tried OTC cough suppressant, cool air, body position changes and rest for the symptoms. The treatment provided mild relief. Her past medical history is significant for environmental allergies.        Current Medication:  Outpatient Encounter Medications as of 10/29/2017  Medication Sig  . albuterol (VENTOLIN HFA) 108 (90 Base) MCG/ACT inhaler Inhale 2 puffs into the lungs every 6 (six) hours as needed for wheezing or shortness of breath.  . ALPRAZolam (XANAX) 0.5 MG tablet Take 1 tablet in the morning and 2 tablets po QHS prn  . buPROPion (WELLBUTRIN SR) 200 MG 12 hr tablet Take 1 tablet (200 mg total) by mouth 2 (two) times daily.  . calcium carbonate (CALCIUM 600) 600 MG TABS tablet Take 600 mg by mouth 2 (two) times daily with a meal.  . Cholecalciferol (VITAMIN D3) 400 units CAPS Take by mouth daily.  Marland Kitchen CRANBERRY PO Take 1 tablet by mouth 2 (two) times daily.  . cyclobenzaprine (FLEXERIL) 10 MG tablet Take 10 mg by mouth 2 (two) times daily as needed for muscle spasms.  Marland Kitchen desvenlafaxine (PRISTIQ) 100 MG 24 hr tablet Take 1 tablet (100 mg total) by  mouth daily.  Marland Kitchen docusate sodium (COLACE) 100 MG capsule Take 100 mg by mouth 2 (two) times daily as needed for mild constipation.  . fluticasone (FLONASE) 50 MCG/ACT nasal spray Place 1 spray into both nostrils daily.  Marland Kitchen imipramine (TOFRANIL) 25 MG tablet Take 25 mg by mouth at bedtime.  Marland Kitchen levocetirizine (XYZAL) 5 MG tablet Take 1 tablet (5 mg total) by mouth daily.  Marland Kitchen lisinopril (PRINIVIL,ZESTRIL) 10 MG tablet Take 1 tablet (10 mg total) by mouth daily.  Marland Kitchen lisinopril (PRINIVIL,ZESTRIL) 5 MG tablet TAKE 1 TABLET(S) BY MOUTH DAILY FOR BLOOD PRESSURE  . lubiprostone (AMITIZA) 8 MCG capsule Take 1 capsule (8 mcg total) by mouth 2 (two) times daily with a meal.  . tamsulosin (FLOMAX) 0.4 MG CAPS capsule Take 0.4 mg by mouth.  . doxycycline (VIBRA-TABS) 100 MG tablet Take 1 tablet (100 mg total) by mouth 2 (two) times daily.  . Promethazine-DM 6.25-15 MG/5ML SOLN Take 5 mLs by mouth 2 (two) times daily as needed.   No facility-administered encounter medications on file as of 10/29/2017.       Medical History: Past Medical History:  Diagnosis Date  . Anxiety   . Arthritis   . Depression   . HTN (hypertension)   . Hyperlipidemia   . Kidney stones 2013     Today's Vitals   10/29/17 1453  BP: 112/62  Pulse: 91  Resp: 16  Temp: 98.6 F (37 C)  SpO2: 97%  Weight: 156 lb (  70.8 kg)  Height: 5' 5.5" (1.664 m)    Review of Systems  Constitutional: Positive for chills, fatigue and fever.  HENT: Positive for congestion, ear pain, postnasal drip, rhinorrhea and sore throat.   Eyes: Negative.   Respiratory: Positive for cough and wheezing.   Cardiovascular: Negative for chest pain and palpitations.  Gastrointestinal: Positive for nausea. Negative for constipation, diarrhea and vomiting.  Endocrine: Negative.   Genitourinary: Negative.   Musculoskeletal: Positive for arthralgias and myalgias.  Allergic/Immunologic: Positive for environmental allergies.  Neurological: Positive for  dizziness and headaches.  Hematological: Positive for adenopathy.    Physical Exam  Constitutional: She is oriented to person, place, and time. She appears well-developed and well-nourished. No distress.  HENT:  Head: Normocephalic and atraumatic.  Right Ear: Tympanic membrane is bulging. Tympanic membrane is not erythematous.  Left Ear: Tympanic membrane is bulging. Tympanic membrane is not erythematous.  Nose: Rhinorrhea present.  Mouth/Throat: Posterior oropharyngeal erythema present. No oropharyngeal exudate.  Eyes: Pupils are equal, round, and reactive to light. Conjunctivae and EOM are normal.  Neck: Normal range of motion. Neck supple. No JVD present. No tracheal deviation present. No thyromegaly present.  Cardiovascular: Normal rate, regular rhythm and normal heart sounds. Exam reveals no gallop and no friction rub.  No murmur heard. Pulmonary/Chest: Effort normal and breath sounds normal. No respiratory distress. She has no wheezes. She has no rales. She exhibits no tenderness.  Harsh, non-productive cough present  Abdominal: Soft. Bowel sounds are normal. There is no tenderness.  Musculoskeletal: Normal range of motion.  Lymphadenopathy:    She has cervical adenopathy.  Neurological: She is alert and oriented to person, place, and time. No cranial nerve deficit.  Skin: Skin is warm and dry. She is not diaphoretic.  Psychiatric: She has a normal mood and affect. Her behavior is normal. Judgment and thought content normal.  Nursing note and vitals reviewed.  Assessment/Plan:  1. Acute upper respiratory infection Start doxycycline 100mg  twice daily for 10 days. Rest and increase fluids. Use OTC medication as indicated to alleviate symptoms - doxycycline (VIBRA-TABS) 100 MG tablet; Take 1 tablet (100 mg total) by mouth 2 (two) times daily.  Dispense: 20 tablet; Refill: 0  2. Cough Promethazine/DM cough suppressant may be taken twice daily as needed for cough, mostly at ngiht.  Samples delsym provided. May be used during the day to avoid side effect of sleepiness. - Promethazine-DM 6.25-15 MG/5ML SOLN; Take 5 mLs by mouth 2 (two) times daily as needed.  Dispense: 118 mL; Refill: 0  General Counseling: Eldred verbalizes understanding of the findings of todays visit and agrees with plan of treatment. I have discussed any further diagnostic evaluation that may be needed or ordered today. We also reviewed her medications today. she has been encouraged to call the office with any questions or concerns that should arise related to todays visit.    Counseling:  Rest and increase fluids. Continue using OTC medication to control symptoms.   This patient was seen by [place] with Dr Lavera Guise as a part of collaborative care agreement  Meds ordered this encounter  Medications  . Promethazine-DM 6.25-15 MG/5ML SOLN    Sig: Take 5 mLs by mouth 2 (two) times daily as needed.    Dispense:  118 mL    Refill:  0    Please run this through with [facility]. thanks    Order Specific Question:   Supervising Provider    Answer:   Clayborn Bigness  M [[id number]]  . doxycycline (VIBRA-TABS) 100 MG tablet    Sig: Take 1 tablet (100 mg total) by mouth 2 (two) times daily.    Dispense:  20 tablet    Refill:  0    Order Specific Question:   Supervising Provider    Answer:   Lavera Guise [[id number]]    Time spent: 15 Minutes

## 2017-10-31 DIAGNOSIS — J069 Acute upper respiratory infection, unspecified: Secondary | ICD-10-CM | POA: Insufficient documentation

## 2017-11-09 ENCOUNTER — Ambulatory Visit: Admit: 2017-11-09 | Payer: No Typology Code available for payment source | Admitting: Gastroenterology

## 2017-11-09 SURGERY — COLONOSCOPY WITH PROPOFOL
Anesthesia: General

## 2017-11-13 ENCOUNTER — Other Ambulatory Visit: Payer: Self-pay | Admitting: Nurse Practitioner

## 2017-11-13 DIAGNOSIS — K581 Irritable bowel syndrome with constipation: Secondary | ICD-10-CM

## 2017-11-30 ENCOUNTER — Encounter: Payer: Self-pay | Admitting: Nurse Practitioner

## 2017-11-30 ENCOUNTER — Ambulatory Visit: Payer: BLUE CROSS/BLUE SHIELD | Admitting: Nurse Practitioner

## 2017-11-30 VITALS — BP 146/90 | HR 84 | Resp 16 | Ht 65.5 in | Wt 158.6 lb

## 2017-11-30 DIAGNOSIS — I10 Essential (primary) hypertension: Secondary | ICD-10-CM | POA: Diagnosis not present

## 2017-11-30 DIAGNOSIS — N329 Bladder disorder, unspecified: Secondary | ICD-10-CM | POA: Diagnosis not present

## 2017-11-30 DIAGNOSIS — F331 Major depressive disorder, recurrent, moderate: Secondary | ICD-10-CM | POA: Diagnosis not present

## 2017-11-30 DIAGNOSIS — F411 Generalized anxiety disorder: Secondary | ICD-10-CM

## 2017-11-30 MED ORDER — ALPRAZOLAM 0.5 MG PO TABS: ORAL_TABLET | ORAL | 3 refills | Status: DC

## 2017-11-30 NOTE — Progress Notes (Signed)
[facility] [place], [place] [address]  Internal MEDICINE  Office Visit Note  Patient Name: Amanda Mcgee  [id number]  [id number]  Date of Service: 12/01/2017  Chief Complaint  Patient presents with  . Medical Management of Chronic Issues    4 month follow up  . Anxiety  . Hypertension  . Pain    right ear pain and felt like it was water in it. it feels better today    The patient is here for routine follow up visit. Was treated for URI at her last visit. Finished antibiotics and continued to have some pain/spasms in the right ear. This has gradually improved and she is feeling much better.  Blood pressure is a bit elevated today. It is typically elevated when she first arrives in the office. She has a dentist appointment after this to have crown replaced and she knows she is a bit anxious about this as well.  She reports that her stress and anxiety have improved. Taking pristiq and wellbutrin every day. She has been able to cut back her use of alprazolam. Most days, she is taking only 2 a day and is slowly trying to cut this back to once daily. She is reluctant to reduce the prescription because she is afraid that is when she would need it more. She states that she is sleeping much better recently and feels more energized.      Current Medication: Outpatient Encounter Medications as of 11/30/2017  Medication Sig  . albuterol (VENTOLIN HFA) 108 (90 Base) MCG/ACT inhaler Inhale 2 puffs into the lungs every 6 (six) hours as needed for wheezing or shortness of breath.  . ALPRAZolam (XANAX) 0.5 MG tablet Take 1 tablet in the morning and 2 tablets po QHS prn  . AMITIZA 8 MCG capsule TAKE 1 CAPSULE (8 MCG TOTAL) BY MOUTH 2 (TWO) TIMES DAILY WITH A MEAL.  Marland Kitchen buPROPion (WELLBUTRIN SR) 200 MG 12 hr tablet Take 1 tablet (200 mg total) by mouth 2 (two) times daily.  . calcium carbonate (CALCIUM 600) 600 MG TABS tablet Take 600 mg by mouth 2 (two) times daily with a  meal.  . Cholecalciferol (VITAMIN D3) 400 units CAPS Take by mouth daily.  Marland Kitchen CRANBERRY PO Take 1 tablet by mouth 2 (two) times daily.  . cyclobenzaprine (FLEXERIL) 10 MG tablet Take 10 mg by mouth 2 (two) times daily as needed for muscle spasms.  Marland Kitchen desvenlafaxine (PRISTIQ) 100 MG 24 hr tablet Take 1 tablet (100 mg total) by mouth daily.  Marland Kitchen docusate sodium (COLACE) 100 MG capsule Take 100 mg by mouth 2 (two) times daily as needed for mild constipation.  . fluticasone (FLONASE) 50 MCG/ACT nasal spray Place 1 spray into both nostrils daily.  Marland Kitchen imipramine (TOFRANIL) 25 MG tablet Take 25 mg by mouth at bedtime.  Marland Kitchen levocetirizine (XYZAL) 5 MG tablet Take 1 tablet (5 mg total) by mouth daily.  Marland Kitchen lisinopril (PRINIVIL,ZESTRIL) 10 MG tablet Take 1 tablet (10 mg total) by mouth daily.  . Promethazine-DM 6.25-15 MG/5ML SOLN Take 5 mLs by mouth 2 (two) times daily as needed.  . tamsulosin (FLOMAX) 0.4 MG CAPS capsule Take 0.4 mg by mouth.  . [DISCONTINUED] ALPRAZolam (XANAX) 0.5 MG tablet Take 1 tablet in the morning and 2 tablets po QHS prn  . [DISCONTINUED] doxycycline (VIBRA-TABS) 100 MG tablet Take 1 tablet (100 mg total) by mouth 2 (two) times daily.  . [DISCONTINUED] lisinopril (PRINIVIL,ZESTRIL) 5 MG tablet TAKE 1 TABLET(S)  BY MOUTH DAILY FOR BLOOD PRESSURE   No facility-administered encounter medications on file as of 11/30/2017.     Surgical History: Past Surgical History:  Procedure Laterality Date  . ABDOMINAL HYSTERECTOMY  1990  . APPENDECTOMY  1969  . BLADDER SURGERY  2011  . BREAST BIOPSY Left    benign  . LITHOTRIPSY  06/2011    Medical History: Past Medical History:  Diagnosis Date  . Anxiety   . Arthritis   . Depression   . HTN (hypertension)   . Hyperlipidemia   . Kidney stones 2013    Family History: Family History  Problem Relation Age of Onset  . Breast cancer Mother 59    Social History   Socioeconomic History  . Marital status: Married    Spouse name: Not  on file  . Number of children: Not on file  . Years of education: Not on file  . Highest education level: Not on file  Occupational History  . Not on file  Social Needs  . Financial resource strain: Not on file  . Food insecurity:    Worry: Not on file    Inability: Not on file  . Transportation needs:    Medical: Not on file    Non-medical: Not on file  Tobacco Use  . Smoking status: Current Every Day Smoker    Packs/day: 0.50    Years: 30.00    Pack years: 15.00    Types: Cigarettes  . Smokeless tobacco: Never Used  Substance and Sexual Activity  . Alcohol use: Yes    Comment: ocassionally  . Drug use: No  . Sexual activity: Never  Lifestyle  . Physical activity:    Days per week: Not on file    Minutes per session: Not on file  . Stress: Not on file  Relationships  . Social connections:    Talks on phone: Not on file    Gets together: Not on file    Attends religious service: Not on file    Active member of club or organization: Not on file    Attends meetings of clubs or organizations: Not on file    Relationship status: Not on file  . Intimate partner violence:    Fear of current or ex partner: Not on file    Emotionally abused: Not on file    Physically abused: Not on file    Forced sexual activity: Not on file  Other Topics Concern  . Not on file  Social History Narrative  . Not on file      Review of Systems  Constitutional: Negative for chills, fatigue and fever.  HENT: Positive for congestion and postnasal drip. Negative for ear pain, rhinorrhea and sore throat.   Eyes: Negative.   Respiratory: Negative for cough and wheezing.   Cardiovascular: Negative for chest pain and palpitations.  Gastrointestinal: Negative for constipation, diarrhea, nausea and vomiting.  Endocrine: Negative for cold intolerance, polydipsia, polyphagia and polyuria.  Genitourinary: Negative.   Musculoskeletal: Negative for arthralgias and myalgias.  Allergic/Immunologic:  Positive for environmental allergies.  Neurological: Negative for dizziness and headaches.  Hematological: Negative for adenopathy.  Psychiatric/Behavioral: Positive for dysphoric mood. The patient is nervous/anxious.     Today's Vitals   11/30/17 0929  BP: (!) 146/90  Pulse: 84  Resp: 16  SpO2: 94%  Weight: 158 lb 9.6 oz (71.9 kg)  Height: 5' 5.5" (1.664 m)    Physical Exam  Constitutional: She is oriented to person, place, and time.  She appears well-developed and well-nourished. No distress.  HENT:  Head: Normocephalic and atraumatic.  Right Ear: External ear normal.  Left Ear: External ear normal.  Nose: Nose normal.  Mouth/Throat: No oropharyngeal exudate.  Eyes: Pupils are equal, round, and reactive to light. EOM are normal.  Neck: Normal range of motion. Neck supple. No JVD present. No tracheal deviation present. No thyromegaly present.  Cardiovascular: Normal rate, regular rhythm and normal heart sounds. Exam reveals no gallop and no friction rub.  No murmur heard. Pulmonary/Chest: Effort normal and breath sounds normal. No respiratory distress. She has no wheezes. She has no rales. She exhibits no tenderness.  Musculoskeletal: Normal range of motion.  Lymphadenopathy:    She has no cervical adenopathy.  Neurological: She is alert and oriented to person, place, and time. No cranial nerve deficit.  Skin: Skin is warm and dry. She is not diaphoretic.  Psychiatric: Her speech is normal and behavior is normal. Judgment and thought content normal. Her mood appears anxious. Cognition and memory are normal.  Nursing note and vitals reviewed.  Assessment/Plan:  1. Essential hypertension Generally well managed. Continue bp medication as prescribed.   2. Generalized anxiety disorder Will continue alprazolam 0.5mg  up to three times daily. Patient  Is in proces of weaning down the dosing of this medications, and is generally taking it only once daily.  - ALPRAZolam (XANAX) 0.5  MG tablet; Take 1 tablet in the morning and 2 tablets po QHS prn  Dispense: 90 tablet; Refill: 3  3. Major depressive disorder, recurrent, moderate (HCC) Improved. Continue pristiq 200mg  and wellbutrin 200mg , both daily. Refills to be given as needed.   4. Bladder disorder, unspecified Continue reglar visits with urology as scheduled.    General Counseling: britzy graul understanding of the findings of todays visit and agrees with plan of treatment. I have discussed any further diagnostic evaluation that may be needed or ordered today. We also reviewed her medications today. she has been encouraged to call the office with any questions or concerns that should arise related to todays visit.  Reviewed risks and possible side effects associated with taking opiates, benzodiazepines and other CNS depressants. Combination of these could cause dizziness and drowsiness. Advised patient not to drive or operate machinery when taking these medications, as patient's and other's life can be at risk and will have consequences. Patient verbalized understanding in this matter. Dependence and abuse for these drugs will be monitored closely. A Controlled substance policy and procedure is on file which allows [facility] to order a urine drug screen test at any visit. Patient understands and agrees with the plan  This patient was seen by Leretha Pol FNP Collaboration with Dr Lavera Guise as a part of collaborative care agreement  Meds ordered this encounter  Medications  . ALPRAZolam (XANAX) 0.5 MG tablet    Sig: Take 1 tablet in the morning and 2 tablets po QHS prn    Dispense:  90 tablet    Refill:  3    Order Specific Question:   Supervising Provider    Answer:   Lavera Guise [[id number]]    Time spent: 54 Minutes      Dr Lavera Guise Internal medicine

## 2017-12-02 ENCOUNTER — Ambulatory Visit: Payer: BLUE CROSS/BLUE SHIELD | Admitting: Gastroenterology

## 2017-12-15 ENCOUNTER — Other Ambulatory Visit: Payer: Self-pay | Admitting: Nurse Practitioner

## 2017-12-15 DIAGNOSIS — F411 Generalized anxiety disorder: Secondary | ICD-10-CM

## 2017-12-15 MED ORDER — ALPRAZOLAM 1 MG PO TABS: ORAL_TABLET | ORAL | 1 refills | Status: DC

## 2017-12-15 NOTE — Progress Notes (Signed)
Due to backorder of alpraaolam 0.5mg  tablets, changed prescription to alprazolam 1mg  tablets. Take 0.5tablets in the am and 1 tablet in pm. New prescription sent to her pharmacy

## 2018-01-19 ENCOUNTER — Ambulatory Visit (INDEPENDENT_AMBULATORY_CARE_PROVIDER_SITE_OTHER): Payer: PRIVATE HEALTH INSURANCE | Admitting: Gastroenterology

## 2018-01-19 ENCOUNTER — Other Ambulatory Visit: Payer: Self-pay

## 2018-01-19 ENCOUNTER — Telehealth: Payer: Self-pay | Admitting: Gastroenterology

## 2018-01-19 ENCOUNTER — Encounter: Payer: Self-pay | Admitting: Gastroenterology

## 2018-01-19 VITALS — BP 129/76 | HR 79 | Ht 65.5 in | Wt 162.0 lb

## 2018-01-19 DIAGNOSIS — R195 Other fecal abnormalities: Secondary | ICD-10-CM

## 2018-01-19 NOTE — Telephone Encounter (Signed)
error 

## 2018-01-19 NOTE — Progress Notes (Signed)
[name] MD, MRCP(U.K) [address]  [place]  [place], [place] [address]  Main: [phone]  Fax: [phone]   Gastroenterology Consultation  Referring Provider:     Lavera Guise, MD Primary Care Physician:  Lavera Guise, MD Primary Gastroenterologist:  Dr. [name]  Reason for Consultation:     Positive cologuard-colonoscopy         HPI:   Amanda Mcgee is a 63 y.o. y/o female referred for consultation & management  by Dr. Humphrey Rolls, Timoteo Gaul, MD.    She is here today to discuss about a colonoscopy. She was initially scheduled for a colonoscopy in 10/2017 but was canceled. Cologuard test was positive in 08/2017   Last colonoscopy 12 years at age 7, they did take out a few polyps. She says that she has long term blood in her urine and had some stool contamination and there may have been some bloody urine with her stool . She does have some constipation. No blood in her stool. Normal shape of her stool. Denies any weight loss.    Past Medical History:  Diagnosis Date  . Anxiety   . Arthritis   . Depression   . HTN (hypertension)   . Hyperlipidemia   . Kidney stones 2013    Past Surgical History:  Procedure Laterality Date  . ABDOMINAL HYSTERECTOMY  1990  . APPENDECTOMY  1969  . BLADDER SURGERY  2011  . BREAST BIOPSY Left    benign  . LITHOTRIPSY  06/2011    Prior to Admission medications   Medication Sig Start Date End Date Taking? Authorizing Provider  albuterol (VENTOLIN HFA) 108 (90 Base) MCG/ACT inhaler Inhale 2 puffs into the lungs every 6 (six) hours as needed for wheezing or shortness of breath.    [provider]  ALPRAZolam Duanne Moron) 1 MG tablet Take 0.5 tablets QAM and 1 tablet po QHS prn anxiety. 12/15/17   Boscia, Greer Ee, NP  AMITIZA 8 MCG capsule TAKE 1 CAPSULE (8 MCG TOTAL) BY MOUTH 2 (TWO) TIMES DAILY WITH A MEAL. 11/15/17   Ronnell Freshwater, NP  buPROPion (WELLBUTRIN SR) 200 MG 12 hr tablet Take 1 tablet (200 mg total) by mouth 2  (two) times daily. 08/03/17   Ronnell Freshwater, NP  calcium carbonate (CALCIUM 600) 600 MG TABS tablet Take 600 mg by mouth 2 (two) times daily with a meal.    [provider]  Cholecalciferol (VITAMIN D3) 400 units CAPS Take by mouth daily.    [provider]  CRANBERRY PO Take 1 tablet by mouth 2 (two) times daily.    [provider]  cyclobenzaprine (FLEXERIL) 10 MG tablet Take 10 mg by mouth 2 (two) times daily as needed for muscle spasms.    [provider]  desvenlafaxine (PRISTIQ) 100 MG 24 hr tablet Take 1 tablet (100 mg total) by mouth daily. 10/01/17   Ronnell Freshwater, NP  docusate sodium (COLACE) 100 MG capsule Take 100 mg by mouth 2 (two) times daily as needed for mild constipation.    [provider]  fluticasone (FLONASE) 50 MCG/ACT nasal spray Place 1 spray into both nostrils daily. 08/03/17   Ronnell Freshwater, NP  imipramine (TOFRANIL) 25 MG tablet Take 25 mg by mouth at bedtime.    [provider]  levocetirizine (XYZAL) 5 MG tablet Take 1 tablet (5 mg total) by mouth daily. 08/03/17   Ronnell Freshwater, NP  lisinopril (PRINIVIL,ZESTRIL) 10 MG tablet  Take 1 tablet (10 mg total) by mouth daily. 08/03/17   Ronnell Freshwater, NP  Promethazine-DM 6.25-15 MG/5ML SOLN Take 5 mLs by mouth 2 (two) times daily as needed. 10/29/17   Ronnell Freshwater, NP  tamsulosin (FLOMAX) 0.4 MG CAPS capsule Take 0.4 mg by mouth.    [provider]    Family History  Problem Relation Age of Onset  . Breast cancer Mother 3     Social History   Tobacco Use  . Smoking status: Current Every Day Smoker    Packs/day: 0.50    Years: 30.00    Pack years: 15.00    Types: Cigarettes  . Smokeless tobacco: Never Used  Substance Use Topics  . Alcohol use: Yes    Comment: ocassionally  . Drug use: No    Allergies as of 01/19/2018 - Review Complete 11/30/2017  Allergen Reaction Noted  . Benadryl [diphenhydramine hcl] Swelling 08/08/2012    . Biaxin [clarithromycin]  01/07/2017  . Ciprofloxacin  01/07/2017  . Codeine Swelling 08/08/2012  . Hydrochlorothiazide  01/07/2017  . Penicillins  08/08/2012  . Prednisone  01/07/2017  . Aspirin Nausea Only 02/13/2016    Review of Systems:    All systems reviewed and negative except where noted in HPI.   Physical Exam:  BP 129/76   Pulse 79   Ht 5' 5.5" (1.664 m)   Wt 162 lb (73.5 kg)   BMI 26.55 kg/m  No LMP recorded. Patient has had a hysterectomy. Psych:  Alert and cooperative. Normal mood and affect. General:   Alert,  Well-developed, well-nourished, pleasant and cooperative in NAD Head:  Normocephalic and atraumatic. Eyes:  Sclera clear, no icterus.   Conjunctiva pink. Ears:  Normal auditory acuity. Nose:  No deformity, discharge, or lesions. Mouth:  No deformity or lesions,oropharynx pink & moist. Neck:  Supple; no masses or thyromegaly. Lungs:  Respirations even and unlabored.  Clear throughout to auscultation.   No wheezes, crackles, or rhonchi. No acute distress. Heart:  Regular rate and rhythm; no murmurs, clicks, rubs, or gallops. Abdomen:  Normal bowel sounds.  No bruits.  Soft, non-tender and non-distended without masses, hepatosplenomegaly or hernias noted.  No guarding or rebound tenderness.    Neurologic:  Alert and oriented x3;  grossly normal neurologically. Skin:  Intact without significant lesions or rashes. No jaundice. Lymph Nodes:  No significant cervical adenopathy. Psych:  Alert and cooperative. Normal mood and affect.  Imaging Studies: No results found.  Assessment and Plan:   Amanda Mcgee is a 63 y.o. y/o female here to discuss about a colonoscopy, cologuard test positive in 08/2017 .    Plan  1. Diagnostic colonoscopy for positive cologuard   I have discussed alternative options, risks & benefits,  which include, but are not limited to, bleeding, infection, perforation,respiratory complication & drug reaction.  The patient agrees with  this plan & written consent will be obtained.    Follow up in PRN  Dr [name] MD,MRCP(U.K)

## 2018-01-26 ENCOUNTER — Telehealth: Payer: Self-pay | Admitting: Gastroenterology

## 2018-01-26 NOTE — Telephone Encounter (Signed)
Patient called today & wanted to clarify if she should stop taking her stool softener for her colonoscopy scheduled on  02-01-2018

## 2018-01-26 NOTE — Telephone Encounter (Signed)
LVM returning patients call regarding meds to stop for colonoscopy.  Asked her to call back to address her questions.  Thanks [employer]

## 2018-01-26 NOTE — Telephone Encounter (Signed)
Pt left vm she has Colonoscopy 02/01/18 and knows to stop her vitamins but needs to know if she needs to stop her stool softeners?

## 2018-01-26 NOTE — Telephone Encounter (Signed)
Patient's call has been returned.  She has been advised that she does not need to take her stool softer prior to the colonoscopy because the bowel prep will help her with elimination.  Thanks [employer]

## 2018-01-27 ENCOUNTER — Other Ambulatory Visit: Payer: Self-pay | Admitting: Nurse Practitioner

## 2018-01-27 DIAGNOSIS — J300 Vasomotor rhinitis: Secondary | ICD-10-CM

## 2018-01-27 MED ORDER — FLUTICASONE PROPIONATE 50 MCG/ACT NA SUSP: 1.0000 | Freq: Every day | NASAL | 6 refills | Status: DC

## 2018-02-01 ENCOUNTER — Encounter
Admission: RE | Disposition: A | Discharge status: Home / Self Care | Payer: Self-pay | Source: Ambulatory Visit | Attending: Gastroenterology

## 2018-02-01 ENCOUNTER — Ambulatory Visit: Payer: PRIVATE HEALTH INSURANCE | Admitting: Certified Registered"

## 2018-02-01 ENCOUNTER — Ambulatory Visit
Admission: RE | Admit: 2018-02-01 | Discharge: 2018-02-01 | Disposition: A | Discharge status: Home / Self Care | Payer: PRIVATE HEALTH INSURANCE | Source: Ambulatory Visit | Attending: Gastroenterology | Admitting: Gastroenterology

## 2018-02-01 ENCOUNTER — Encounter: Payer: Self-pay | Admitting: *Deleted

## 2018-02-01 DIAGNOSIS — F329 Major depressive disorder, single episode, unspecified: Secondary | ICD-10-CM | POA: Insufficient documentation

## 2018-02-01 DIAGNOSIS — D122 Benign neoplasm of ascending colon: Secondary | ICD-10-CM | POA: Diagnosis not present

## 2018-02-01 DIAGNOSIS — R195 Other fecal abnormalities: Secondary | ICD-10-CM | POA: Insufficient documentation

## 2018-02-01 DIAGNOSIS — D124 Benign neoplasm of descending colon: Secondary | ICD-10-CM | POA: Diagnosis not present

## 2018-02-01 DIAGNOSIS — F419 Anxiety disorder, unspecified: Secondary | ICD-10-CM | POA: Diagnosis not present

## 2018-02-01 DIAGNOSIS — I10 Essential (primary) hypertension: Secondary | ICD-10-CM | POA: Insufficient documentation

## 2018-02-01 DIAGNOSIS — F17200 Nicotine dependence, unspecified, uncomplicated: Secondary | ICD-10-CM | POA: Diagnosis not present

## 2018-02-01 DIAGNOSIS — Z79899 Other long term (current) drug therapy: Secondary | ICD-10-CM | POA: Insufficient documentation

## 2018-02-01 DIAGNOSIS — J449 Chronic obstructive pulmonary disease, unspecified: Secondary | ICD-10-CM | POA: Diagnosis not present

## 2018-02-01 DIAGNOSIS — D123 Benign neoplasm of transverse colon: Secondary | ICD-10-CM

## 2018-02-01 HISTORY — PX: COLONOSCOPY WITH PROPOFOL: SHX5780

## 2018-02-01 SURGERY — COLONOSCOPY WITH PROPOFOL
Anesthesia: General

## 2018-02-01 MED ORDER — LIDOCAINE HCL (CARDIAC) PF 100 MG/5ML IV SOSY
PREFILLED_SYRINGE | INTRAVENOUS | Status: DC | PRN
  Administered 2018-02-01: 60 mg via INTRATRACHEAL

## 2018-02-01 MED ORDER — PROPOFOL 10 MG/ML IV BOLUS
INTRAVENOUS | Status: DC | PRN
  Administered 2018-02-01: 60 mg via INTRAVENOUS
  Administered 2018-02-01 (×2): 20 mg via INTRAVENOUS

## 2018-02-01 MED ORDER — PROPOFOL 10 MG/ML IV BOLUS
INTRAVENOUS | Status: AC
  Filled 2018-02-01: qty 40

## 2018-02-01 MED ORDER — SODIUM CHLORIDE 0.9 % IV SOLN
INTRAVENOUS | Status: DC
  Administered 2018-02-01: 1000 mL via INTRAVENOUS

## 2018-02-01 MED ORDER — LIDOCAINE HCL (PF) 2 % IJ SOLN
INTRAMUSCULAR | Status: AC
  Filled 2018-02-01: qty 10

## 2018-02-01 MED ORDER — PROPOFOL 500 MG/50ML IV EMUL
INTRAVENOUS | Status: DC | PRN
  Administered 2018-02-01: 125 ug/kg/min via INTRAVENOUS

## 2018-02-01 NOTE — Op Note (Signed)
[facility] Gastroenterology Patient Name: [place] Mcgee Procedure Date: 02/01/2018 8:16 AM MRN: [id number] Account #: [id number] Date of Birth: [birth year] Admit Type: Outpatient Age: 63 Room: [facility] ENDO ROOM 1 Gender: Female Note Status: Finalized Procedure:            Colonoscopy Indications:          Positive Cologuard test Providers:            [name] MD, MD Referring MD:         Lavera Guise, MD (Referring MD) Medicines:            Monitored Anesthesia Care Complications:        No immediate complications. Procedure:            Pre-Anesthesia Assessment:                       - Prior to the procedure, a History and Physical was                        performed, and patient medications, allergies and                        sensitivities were reviewed. The patient's tolerance of                        previous anesthesia was reviewed.                       - The risks and benefits of the procedure and the                        sedation options and risks were discussed with the                        patient. All questions were answered and informed                        consent was obtained.                       - ASA Grade Assessment: II - A patient with mild                        systemic disease.                       After obtaining informed consent, the colonoscope was                        passed under direct vision. Throughout the procedure,                        the patient's blood pressure, pulse, and oxygen                        saturations were monitored continuously. The                        Colonoscope was introduced through the anus and  advanced to the the cecum, identified by the                        appendiceal orifice, IC valve and transillumination.                        The colonoscopy was performed with ease. The patient                        tolerated the procedure well. The quality of the bowel                     preparation was good. Findings:      The perianal and digital rectal examinations were normal.      A 5 mm polyp was found in the rectum. The polyp was sessile. The polyp       was removed with a cold snare. Resection and retrieval were complete.      Three sessile polyps were found in the descending colon. The polyps were       3 to 4 mm in size. These polyps were removed with a cold biopsy forceps.       Resection and retrieval were complete.      A 5 mm polyp was found in the descending colon. The polyp was sessile.       The polyp was removed with a cold snare. Resection and retrieval were       complete.      A 3 mm polyp was found in the transverse colon. The polyp was sessile.       The polyp was removed with a cold biopsy forceps. Resection and       retrieval were complete.      A 6 mm polyp was found in the transverse colon. The polyp was       semi-pedunculated. The polyp was removed with a hot snare. Resection and       retrieval were complete. To prevent bleeding after the polypectomy, one       hemostatic clip was successfully placed. There was no bleeding during,       or at the end, of the procedure.      A 5 mm polyp was found in the proximal ascending colon. The polyp was       sessile. The polyp was removed with a cold biopsy forceps. Resection and       retrieval were complete. To prevent bleeding after the polypectomy, two       hemostatic clips were successfully placed. There was no bleeding at the       end of the maneuver.      A 10 mm polyp was found in the proximal ascending colon. The polyp was       sessile. Preparations were made for mucosal resection. 4 mL of saline       was injected with adequate lift of the lesion from the muscularis       propria. Snare mucosal resection with suction (via the working channel)       retrieval was performed. A 12 mm area was resected. Resection and       retrieval were complete. There was no bleeding  during and at the end of       the procedure.      The exam was otherwise without abnormality on direct and  retroflexion       views. Impression:           - One 5 mm polyp in the rectum, removed with a cold                        snare. Resected and retrieved.                       - Three 3 to 4 mm polyps in the descending colon,                        removed with a cold biopsy forceps. Resected and                        retrieved.                       - One 5 mm polyp in the descending colon, removed with                        a cold snare. Resected and retrieved.                       - One 3 mm polyp in the transverse colon, removed with                        a cold biopsy forceps. Resected and retrieved.                       - One 6 mm polyp in the transverse colon, removed with                        a hot snare. Resected and retrieved. Clip was placed.                       - One 5 mm polyp in the proximal ascending colon,                        removed with a cold biopsy forceps. Resected and                        retrieved. Clips were placed.                       - One 10 mm polyp in the proximal ascending colon,                        removed with mucosal resection. Resected and retrieved.                       - The examination was otherwise normal on direct and                        retroflexion views.                       - Mucosal resection was performed. Resection and                        retrieval were complete. Recommendation:       -  Discharge patient to home (with escort).                       - Resume previous diet.                       - Continue present medications.                       - Await pathology results.                       - Repeat colonoscopy in 3 - 5 years for surveillance                        based on pathology results. Procedure Code(s):    --- Professional ---                       [phone], 59, Colonoscopy, flexible; with  endoscopic                        mucosal resection                       [phone], Colonoscopy, flexible; with removal of tumor(s),                        polyp(s), or other lesion(s) by snare technique                       [id number], 30, Colonoscopy, flexible; with biopsy, single                        or multiple Diagnosis Code(s):    --- Professional ---                       K62.1, Rectal polyp                       D12.4, Benign neoplasm of descending colon                       D12.3, Benign neoplasm of transverse colon (hepatic                        flexure or splenic flexure)                       D12.2, Benign neoplasm of ascending colon                       R19.5, Other fecal abnormalities CPT copyright 2018 American Medical Association. All rights reserved. The codes documented in this report are preliminary and upon coder review may  be revised to meet current compliance requirements. [name], MD [name] MD, MD 02/01/2018 8:56:43 AM This report has been signed electronically. Number of Addenda: 0 Note Initiated On: 02/01/2018 8:16 AM Scope Withdrawal Time: 0 hours 14 minutes 40 seconds  Total Procedure Duration: 0 hours 30 minutes 39 seconds       [facility]

## 2018-02-01 NOTE — Transfer of Care (Signed)
Immediate Anesthesia Transfer of Care Note  Patient: [place] Mcgee  Procedure(s) Performed: COLONOSCOPY WITH PROPOFOL (N/A )  Patient Location: Endoscopy Unit  Anesthesia Type:General  Level of Consciousness: awake  Airway & Oxygen Therapy: Patient Spontanous Breathing and Patient connected to nasal cannula oxygen  Post-op Assessment: Report given to RN and Post -op Vital signs reviewed and stable  Post vital signs: stable  Last Vitals:  Vitals Value Taken Time  BP 152/63 02/01/2018  8:58 AM  Temp 36.2 C 02/01/2018  8:58 AM  Pulse 70 02/01/2018  9:01 AM  Resp 14 02/01/2018  9:01 AM  SpO2 100 % 02/01/2018  9:01 AM  Vitals shown include unvalidated device data.  Last Pain:  Vitals:   02/01/18 0858  TempSrc: Tympanic  PainSc: 0-No pain         Complications: No apparent anesthesia complications

## 2018-02-01 NOTE — H&P (Addendum)
[name], MD [address], [place], [place], [place], [address] [address], [place], [place], [place], [address] Phone: [phone]  Fax: [phone]  Primary Care Physician:  Lavera Guise, MD   Pre-Procedure History & Physical: HPI:  [place] Mcgee is a 63 y.o. female is here for an colonoscopy.   Past Medical History:  Diagnosis Date  . Anxiety   . Arthritis   . Depression   . HTN (hypertension)   . Hyperlipidemia   . Kidney stones 2013    Past Surgical History:  Procedure Laterality Date  . ABDOMINAL HYSTERECTOMY  1990  . APPENDECTOMY  1969  . BLADDER SURGERY  2011  . BREAST BIOPSY Left    benign  . LITHOTRIPSY  06/2011    Prior to Admission medications   Medication Sig Start Date End Date Taking? Authorizing Provider  ALPRAZolam Duanne Moron) 1 MG tablet Take 0.5 tablets QAM and 1 tablet po QHS prn anxiety. 12/15/17  Yes Boscia, Greer Ee, NP  buPROPion (WELLBUTRIN SR) 200 MG 12 hr tablet Take 1 tablet (200 mg total) by mouth 2 (two) times daily. 08/03/17  Yes Boscia, Greer Ee, NP  calcium carbonate (CALCIUM 600) 600 MG TABS tablet Take 600 mg by mouth 2 (two) times daily with a meal.   Yes [provider]  Cholecalciferol (VITAMIN D3) 400 units CAPS Take by mouth daily.   Yes [provider]  CRANBERRY PO Take 1 tablet by mouth 2 (two) times daily.   Yes [provider]  cyclobenzaprine (FLEXERIL) 10 MG tablet Take 10 mg by mouth 2 (two) times daily as needed for muscle spasms.   Yes [provider]  docusate sodium (COLACE) 100 MG capsule Take 100 mg by mouth 2 (two) times daily as needed for mild constipation.   Yes [provider]  fluticasone (FLONASE) 50 MCG/ACT nasal spray Place 1 spray into both nostrils daily. 01/27/18  Yes Boscia, Greer Ee, NP  levocetirizine (XYZAL) 5 MG tablet Take 1 tablet (5 mg total) by mouth daily. 08/03/17  Yes Boscia, Heather E, NP  lisinopril (PRINIVIL,ZESTRIL) 10 MG tablet Take 1  tablet (10 mg total) by mouth daily. 08/03/17  Yes Boscia, Greer Ee, NP  Promethazine-DM 6.25-15 MG/5ML SOLN Take 5 mLs by mouth 2 (two) times daily as needed. 10/29/17  Yes Boscia, Greer Ee, NP  tamsulosin (FLOMAX) 0.4 MG CAPS capsule Take 0.4 mg by mouth.   Yes [provider]  albuterol (VENTOLIN HFA) 108 (90 Base) MCG/ACT inhaler Inhale 2 puffs into the lungs every 6 (six) hours as needed for wheezing or shortness of breath.    [provider]  AMITIZA 8 MCG capsule TAKE 1 CAPSULE (8 MCG TOTAL) BY MOUTH 2 (TWO) TIMES DAILY WITH A MEAL. Patient not taking: Reported on 02/01/2018 11/15/17   Ronnell Freshwater, NP  desvenlafaxine (PRISTIQ) 100 MG 24 hr tablet Take 1 tablet (100 mg total) by mouth daily. Patient not taking: Reported on 02/01/2018 10/01/17   Ronnell Freshwater, NP  imipramine (TOFRANIL) 25 MG tablet Take 25 mg by mouth at bedtime.    [provider]    Allergies as of 01/19/2018 - Review Complete 01/19/2018  Allergen Reaction Noted  . Benadryl [diphenhydramine hcl] Swelling 08/08/2012  . Biaxin [clarithromycin]  01/07/2017  . Ciprofloxacin  01/07/2017  . Codeine Swelling 08/08/2012  . Hydrochlorothiazide  01/07/2017  . Penicillins  08/08/2012  . Prednisone  01/07/2017  . Aspirin Nausea Only 02/13/2016  Family History  Problem Relation Age of Onset  . Breast cancer Mother 30    Social History   Socioeconomic History  . Marital status: Married    Spouse name: Not on file  . Number of children: Not on file  . Years of education: Not on file  . Highest education level: Not on file  Occupational History  . Not on file  Social Needs  . Financial resource strain: Not on file  . Food insecurity:    Worry: Not on file    Inability: Not on file  . Transportation needs:    Medical: Not on file    Non-medical: Not on file  Tobacco Use  . Smoking status: Current Every Day Smoker    Packs/day: 0.50    Years: 30.00    Pack years: 15.00     Types: Cigarettes  . Smokeless tobacco: Never Used  Substance and Sexual Activity  . Alcohol use: Yes    Comment: ocassionally  . Drug use: No  . Sexual activity: Never  Lifestyle  . Physical activity:    Days per week: Not on file    Minutes per session: Not on file  . Stress: Not on file  Relationships  . Social connections:    Talks on phone: Not on file    Gets together: Not on file    Attends religious service: Not on file    Active member of club or organization: Not on file    Attends meetings of clubs or organizations: Not on file    Relationship status: Not on file  . Intimate partner violence:    Fear of current or ex partner: Not on file    Emotionally abused: Not on file    Physically abused: Not on file    Forced sexual activity: Not on file  Other Topics Concern  . Not on file  Social History Narrative  . Not on file    Review of Systems: See HPI, otherwise negative ROS  Physical Exam: BP 133/75   Pulse 68   Temp (!) 96.7 F (35.9 C) (Tympanic)   Resp 18   Ht 5' 5.5" (1.664 m)   Wt 69.4 kg   SpO2 98%   BMI 25.07 kg/m  General:   Alert,  pleasant and cooperative in NAD Head:  Normocephalic and atraumatic. Neck:  Supple; no masses or thyromegaly. Lungs:  Clear throughout to auscultation, normal respiratory effort.    Heart:  +S1, +S2, Regular rate and rhythm, No edema. Abdomen:  Soft, nontender and nondistended. Normal bowel sounds, without guarding, and without rebound.   Neurologic:  Alert and  oriented x4;  grossly normal neurologically.  Impression/Plan: [place] Mcgee is here for an colonoscopy to be performed for positive cologuard . Risks, benefits, limitations, and alternatives regarding  colonoscopy have been reviewed with the patient.  Questions have been answered.  All parties agreeable.   [name], MD  02/01/2018, 8:07 AM

## 2018-02-01 NOTE — Anesthesia Preprocedure Evaluation (Signed)
Anesthesia Evaluation  Patient identified by MRN, date of birth, ID band Patient awake    Reviewed: Allergy & Precautions, H&P , NPO status , Patient's Chart, lab work & pertinent test results  History of Anesthesia Complications Negative for: history of anesthetic complications  Airway Mallampati: III  TM Distance: >3 FB Neck ROM: full    Dental  (+) Chipped, Poor Dentition   Pulmonary shortness of breath and with exertion, COPD, Current Smoker,           Cardiovascular Exercise Tolerance: Good hypertension, (-) angina(-) Past MI and (-) DOE      Neuro/Psych PSYCHIATRIC DISORDERS negative neurological ROS     GI/Hepatic negative GI ROS, Neg liver ROS,   Endo/Other  negative endocrine ROSHypothyroidism   Renal/GU Renal disease  negative genitourinary   Musculoskeletal  (+) Arthritis ,   Abdominal   Peds  Hematology negative hematology ROS (+)   Anesthesia Other Findings Past Medical History: No date: Anxiety No date: Arthritis No date: Depression No date: HTN (hypertension) No date: Hyperlipidemia 2013: Kidney stones  Past Surgical History: 1990: ABDOMINAL HYSTERECTOMY 1969: APPENDECTOMY 2011: BLADDER SURGERY No date: BREAST BIOPSY; Left     Comment:  benign 06/2011: LITHOTRIPSY  BMI    Body Mass Index:  25.07 kg/m      Reproductive/Obstetrics negative OB ROS                             Anesthesia Physical Anesthesia Plan  ASA: III  Anesthesia Plan: General   Post-op Pain Management:    Induction: Intravenous  PONV Risk Score and Plan: Propofol infusion and TIVA  Airway Management Planned: Natural Airway and Nasal Cannula  Additional Equipment:   Intra-op Plan:   Post-operative Plan:   Informed Consent: I have reviewed the patients History and Physical, chart, labs and discussed the procedure including the risks, benefits and alternatives for the proposed  anesthesia with the patient or authorized representative who has indicated his/her understanding and acceptance.     Dental Advisory Given  Plan Discussed with: Anesthesiologist, CRNA and Surgeon  Anesthesia Plan Comments: (Patient consented for risks of anesthesia including but not limited to:  - adverse reactions to medications - risk of intubation if required - damage to teeth, lips or other oral mucosa - sore throat or hoarseness - Damage to heart, brain, lungs or loss of life  Patient voiced understanding.)        Anesthesia Quick Evaluation

## 2018-02-01 NOTE — Anesthesia Postprocedure Evaluation (Signed)
Anesthesia Post Note  Patient: [place] Mcgee  Procedure(s) Performed: COLONOSCOPY WITH PROPOFOL (N/A )  Patient location during evaluation: Endoscopy Anesthesia Type: General Level of consciousness: awake and alert Pain management: pain level controlled Vital Signs Assessment: post-procedure vital signs reviewed and stable Respiratory status: spontaneous breathing, nonlabored ventilation, respiratory function stable and patient connected to nasal cannula oxygen Cardiovascular status: blood pressure returned to baseline and stable Postop Assessment: no apparent nausea or vomiting Anesthetic complications: no     Last Vitals:  Vitals:   02/01/18 0908 02/01/18 0918  BP: 136/80 (!) 121/96  Pulse: 68 66  Resp: 10 17  Temp:    SpO2: 100% 99%    Last Pain:  Vitals:   02/01/18 0918  TempSrc:   PainSc: 0-No pain                 Precious Haws Cheila Wickstrom

## 2018-02-01 NOTE — Anesthesia Post-op Follow-up Note (Signed)
Anesthesia QCDR form completed.        

## 2018-02-02 ENCOUNTER — Encounter: Payer: Self-pay | Admitting: Gastroenterology

## 2018-02-02 LAB — SURGICAL PATHOLOGY

## 2018-02-07 ENCOUNTER — Encounter: Payer: Self-pay | Admitting: Gastroenterology

## 2018-02-08 ENCOUNTER — Telehealth: Payer: Self-pay | Admitting: Nurse Practitioner

## 2018-02-08 NOTE — Telephone Encounter (Signed)
Prior auth for Baker Pierini has been denied by insurance , pharmacy notified

## 2018-02-09 ENCOUNTER — Other Ambulatory Visit: Payer: Self-pay | Admitting: Nurse Practitioner

## 2018-02-09 DIAGNOSIS — F411 Generalized anxiety disorder: Secondary | ICD-10-CM

## 2018-02-09 MED ORDER — ALPRAZOLAM 1 MG PO TABS: ORAL_TABLET | ORAL | 1 refills | Status: DC

## 2018-02-09 NOTE — Progress Notes (Signed)
Approved prescription for alprazolam 1mg  as needed. #45 with one refill sent to pharmacy per request.

## 2018-03-17 ENCOUNTER — Other Ambulatory Visit: Payer: Self-pay | Admitting: Nurse Practitioner

## 2018-03-17 ENCOUNTER — Telehealth: Payer: Self-pay

## 2018-03-17 NOTE — Telephone Encounter (Signed)
Can we et a prior auth for [employer] then? That medication is not even a prescription. She has chronic constipation which affects the issues she has with her bladder. She has been on this low dose amitiza for while and it works. And that is what WAS preferred by her insurance.

## 2018-03-17 NOTE — Telephone Encounter (Signed)
Can you find out which constipation medication her insurance prefers? Once we know tha answer to that I can change the prescription. Thanks.

## 2018-03-28 ENCOUNTER — Telehealth: Payer: Self-pay

## 2018-03-28 NOTE — Telephone Encounter (Signed)
Yes, howeer, maybe take it once daily with food for now. If not effective, we can increase it to twice daily.

## 2018-03-29 NOTE — Telephone Encounter (Signed)
Pt was notified.  

## 2018-03-31 ENCOUNTER — Ambulatory Visit: Payer: Self-pay | Admitting: Nurse Practitioner

## 2018-04-04 ENCOUNTER — Telehealth: Payer: Self-pay | Admitting: Internal Medicine

## 2018-04-04 NOTE — Telephone Encounter (Signed)
Approvedtoday Approved for AMITIZA (Lubiprostone Capsule 8 MCG), quantity 60 capsules per 30 days. Patient informed , and pharmacy notified

## 2018-04-15 ENCOUNTER — Telehealth: Payer: Self-pay

## 2018-04-15 ENCOUNTER — Other Ambulatory Visit: Payer: Self-pay | Admitting: Nurse Practitioner

## 2018-04-15 DIAGNOSIS — F411 Generalized anxiety disorder: Secondary | ICD-10-CM

## 2018-04-15 MED ORDER — ALPRAZOLAM 1 MG PO TABS: ORAL_TABLET | ORAL | 0 refills | Status: DC

## 2018-04-15 NOTE — Telephone Encounter (Signed)
Pt was notified.  

## 2018-04-15 NOTE — Progress Notes (Signed)
Approved 30 day refill for her alprazolam and sent to [facility].

## 2018-04-15 NOTE — Telephone Encounter (Signed)
Approved 30 day refill for her alprazolam and sent to [facility].

## 2018-04-18 ENCOUNTER — Other Ambulatory Visit: Payer: Self-pay

## 2018-04-18 ENCOUNTER — Encounter: Payer: Self-pay | Admitting: Adult Health

## 2018-04-18 ENCOUNTER — Ambulatory Visit (INDEPENDENT_AMBULATORY_CARE_PROVIDER_SITE_OTHER): Payer: PRIVATE HEALTH INSURANCE | Admitting: Adult Health

## 2018-04-18 VITALS — BP 123/71 | HR 86 | Resp 16 | Ht 65.5 in | Wt 162.0 lb

## 2018-04-18 DIAGNOSIS — N329 Bladder disorder, unspecified: Secondary | ICD-10-CM | POA: Diagnosis not present

## 2018-04-18 DIAGNOSIS — F331 Major depressive disorder, recurrent, moderate: Secondary | ICD-10-CM | POA: Diagnosis not present

## 2018-04-18 DIAGNOSIS — I10 Essential (primary) hypertension: Secondary | ICD-10-CM | POA: Diagnosis not present

## 2018-04-18 DIAGNOSIS — F411 Generalized anxiety disorder: Secondary | ICD-10-CM

## 2018-04-18 DIAGNOSIS — F17210 Nicotine dependence, cigarettes, uncomplicated: Secondary | ICD-10-CM

## 2018-04-18 MED ORDER — TAMSULOSIN HCL 0.4 MG PO CAPS: 0.4000 mg | ORAL_CAPSULE | Freq: Every day | ORAL | 1 refills | Status: DC

## 2018-04-18 NOTE — Progress Notes (Signed)
[facility] [place],  [address]  Internal MEDICINE  Telephone Visit  Patient Name: Amanda Mcgee  [id number]  [id number]  Date of Service: 04/18/2018  I connected with the patient at 3:15 by video and verified the patients identity using two identifiers.  I discussed the limitations, risks, security and privacy concerns of performing an evaluation and management service by telephone and the availability of in person appointments. I also discussed with the patient that there may be a patient responsible charge related to the service.  The patient expressed understanding and agrees to proceed.    Chief Complaint  Patient presents with  . Telephone Assessment  . Telephone Screen  . Hypertension  . Hyperlipidemia  . Anxiety  . Medication Refill    Xanax    HPI  Pt being seen for follow up on HTN, HLD, and anxiety.  She reports her depression and anxiety and a little worse but she contributes this to being stuck at home during Covid crisis. Pt reports her blood pressure has been good. Denies chest pain, sob, palpitations or headaches.  She denies needing any refills except for Flomax at this time.       Current Medication: Outpatient Encounter Medications as of 04/18/2018  Medication Sig  . albuterol (VENTOLIN HFA) 108 (90 Base) MCG/ACT inhaler Inhale 2 puffs into the lungs every 6 (six) hours as needed for wheezing or shortness of breath.  . ALPRAZolam (XANAX) 1 MG tablet Take 0.5 tablets QAM and 1 tablet po QHS prn anxiety.  . AMITIZA 8 MCG capsule TAKE 1 CAPSULE (8 MCG TOTAL) BY MOUTH 2 (TWO) TIMES DAILY WITH A MEAL.  Marland Kitchen buPROPion (WELLBUTRIN SR) 200 MG 12 hr tablet Take 1 tablet (200 mg total) by mouth 2 (two) times daily.  . calcium carbonate (CALCIUM 600) 600 MG TABS tablet Take 600 mg by mouth 2 (two) times daily with a meal.  . Cholecalciferol (VITAMIN D3) 400 units CAPS Take by mouth daily.  Marland Kitchen CRANBERRY PO Take 1 tablet by mouth 2 (two) times  daily.  Marland Kitchen desvenlafaxine (PRISTIQ) 100 MG 24 hr tablet Take 1 tablet (100 mg total) by mouth daily.  Marland Kitchen docusate sodium (COLACE) 100 MG capsule Take 100 mg by mouth 2 (two) times daily as needed for mild constipation.  . fluticasone (FLONASE) 50 MCG/ACT nasal spray Place 1 spray into both nostrils daily.  Marland Kitchen levocetirizine (XYZAL) 5 MG tablet Take 1 tablet (5 mg total) by mouth daily.  Marland Kitchen lisinopril (PRINIVIL,ZESTRIL) 10 MG tablet Take 1 tablet (10 mg total) by mouth daily.  . tamsulosin (FLOMAX) 0.4 MG CAPS capsule Take 1 capsule (0.4 mg total) by mouth daily after breakfast.  . [DISCONTINUED] tamsulosin (FLOMAX) 0.4 MG CAPS capsule Take 0.4 mg by mouth.  . cyclobenzaprine (FLEXERIL) 10 MG tablet Take 10 mg by mouth 2 (two) times daily as needed for muscle spasms.  Marland Kitchen imipramine (TOFRANIL) 25 MG tablet Take 25 mg by mouth at bedtime.  . [DISCONTINUED] Promethazine-DM 6.25-15 MG/5ML SOLN Take 5 mLs by mouth 2 (two) times daily as needed. (Patient not taking: Reported on 04/18/2018)   No facility-administered encounter medications on file as of 04/18/2018.     Surgical History: Past Surgical History:  Procedure Laterality Date  . ABDOMINAL HYSTERECTOMY  1990  . APPENDECTOMY  1969  . BLADDER SURGERY  2011  . BREAST BIOPSY Left    benign  . COLONOSCOPY WITH PROPOFOL N/A 02/01/2018   Procedure: COLONOSCOPY WITH PROPOFOL;  Surgeon:  [name], MD;  [facility]: [facility] ENDOSCOPY;  Service: Gastroenterology;  Laterality: N/A;  . LITHOTRIPSY  06/2011    Medical History: Past Medical History:  Diagnosis Date  . Anxiety   . Arthritis   . Depression   . HTN (hypertension)   . Hyperlipidemia   . Kidney stones 2013    Family History: Family History  Problem Relation Age of Onset  . Breast cancer Mother 49    Social History   Socioeconomic History  . Marital status: Married    Spouse name: Not on file  . Number of children: Not on file  . Years of education: Not on file  . Highest education  level: Not on file  Occupational History  . Not on file  Social Needs  . Financial resource strain: Not on file  . Food insecurity:    Worry: Not on file    Inability: Not on file  . Transportation needs:    Medical: Not on file    Non-medical: Not on file  Tobacco Use  . Smoking status: Current Every Day Smoker    Packs/day: 0.50    Years: 30.00    Pack years: 15.00    Types: Cigarettes  . Smokeless tobacco: Never Used  Substance and Sexual Activity  . Alcohol use: Yes    Comment: ocassionally  . Drug use: No  . Sexual activity: Never  Lifestyle  . Physical activity:    Days per week: Not on file    Minutes per session: Not on file  . Stress: Not on file  Relationships  . Social connections:    Talks on phone: Not on file    Gets together: Not on file    Attends religious service: Not on file    Active member of club or organization: Not on file    Attends meetings of clubs or organizations: Not on file    Relationship status: Not on file  . Intimate partner violence:    Fear of current or ex partner: Not on file    Emotionally abused: Not on file    Physically abused: Not on file    Forced sexual activity: Not on file  Other Topics Concern  . Not on file  Social History Narrative  . Not on file      Review of Systems  Constitutional: Negative for chills, fatigue and unexpected weight change.  HENT: Negative for congestion, rhinorrhea, sneezing and sore throat.   Eyes: Negative for photophobia, pain and redness.  Respiratory: Negative for cough, chest tightness and shortness of breath.   Cardiovascular: Negative for chest pain and palpitations.  Gastrointestinal: Negative for abdominal pain, constipation, diarrhea, nausea and vomiting.  Endocrine: Negative.   Genitourinary: Negative for dysuria and frequency.  Musculoskeletal: Negative for arthralgias, back pain, joint swelling and neck pain.  Skin: Negative for rash.  Allergic/Immunologic: Negative.    Neurological: Negative for tremors and numbness.  Hematological: Negative for adenopathy. Does not bruise/bleed easily.  Psychiatric/Behavioral: Negative for behavioral problems and sleep disturbance. The patient is not nervous/anxious.     Vital Signs: BP 123/71   Pulse 86   Resp 16   Ht 5' 5.5" (1.664 m)   Wt 162 lb (73.5 kg)   BMI 26.55 kg/m    Observation/Objective: Doing well, NAD noted.    Assessment/Plan: 1. Generalized anxiety disorder STable, pt does report some increased anxiety which she is combating by walking around outside her house, and continuing to avoid people.   2.  Essential hypertension Stable, continue present management.   3. Major depressive disorder, recurrent, moderate (HCC) Stable, pt appears to be coping as well as can be at this time.   4. Bladder disorder, unspecified Refilled patients Flomax for Bladder leaking.  - tamsulosin (FLOMAX) 0.4 MG CAPS capsule; Take 1 capsule (0.4 mg total) by mouth daily after breakfast.  Dispense: 90 capsule; Refill: 1  5. Nicotine dependence, cigarettes, uncomplicated Smoking cessation counseling: 1. Pt acknowledges the risks of long term smoking, she will try to quite smoking. 2. Options for different medications including nicotine products, chewing gum, patch etc, Wellbutrin and Chantix is discussed 3. Goal and date of compete cessation is discussed 4. Total time spent in smoking cessation is 15 min.   General Counseling: harlei lehrmann understanding of the findings of today's phone visit and agrees with plan of treatment. I have discussed any further diagnostic evaluation that may be needed or ordered today. We also reviewed her medications today. she has been encouraged to call the office with any questions or concerns that should arise related to todays visit.    No orders of the defined types were placed in this encounter.   Meds ordered this encounter  Medications  . tamsulosin (FLOMAX) 0.4 MG  CAPS capsule    Sig: Take 1 capsule (0.4 mg total) by mouth daily after breakfast.    Dispense:  90 capsule    Refill:  1    Time spent: [place] AGNP-C Internal medicine

## 2018-04-18 NOTE — Patient Instructions (Signed)

## 2018-05-12 ENCOUNTER — Other Ambulatory Visit: Payer: Self-pay | Admitting: Nurse Practitioner

## 2018-05-12 ENCOUNTER — Telehealth: Payer: Self-pay

## 2018-05-12 DIAGNOSIS — F411 Generalized anxiety disorder: Secondary | ICD-10-CM

## 2018-05-12 MED ORDER — ALPRAZOLAM 0.5 MG PO TABS: ORAL_TABLET | ORAL | 2 refills | Status: DC

## 2018-05-12 NOTE — Progress Notes (Signed)
Changed dosing of alprazolam back to 0.5mg  taking 1 tablet in the AM and 2 tablets QHS and sent new prescription to her pharmacy.

## 2018-05-12 NOTE — Telephone Encounter (Signed)
Pt advised we send med  

## 2018-05-12 NOTE — Telephone Encounter (Signed)
Changed dosing of alprazolam back to 0.5mg  taking 1 tablet in the AM and 2 tablets QHS and sent new prescription to her pharmacy.

## 2018-05-18 ENCOUNTER — Other Ambulatory Visit: Payer: Self-pay

## 2018-05-18 DIAGNOSIS — F331 Major depressive disorder, recurrent, moderate: Secondary | ICD-10-CM

## 2018-05-18 MED ORDER — DESVENLAFAXINE SUCCINATE ER 100 MG PO TB24: 100.0000 mg | ORAL_TABLET | Freq: Every day | ORAL | 1 refills | Status: DC

## 2018-05-31 ENCOUNTER — Other Ambulatory Visit: Payer: Self-pay

## 2018-05-31 MED ORDER — MECLIZINE HCL 25 MG PO TABS: ORAL_TABLET | ORAL | 0 refills | Status: DC

## 2018-05-31 NOTE — Telephone Encounter (Signed)
Pt called stating that she have been having motion sickness and dizziness since she got on the boat on last Saturday, per [facility] pt is to take antivert 2 times daily as needed for dizziness. rx was sent to pt pharmacy and also advised pt that this medication may cause drowsiness, do not drive while taking the medication. Pt is also to follow up in 2wks.

## 2018-06-08 ENCOUNTER — Telehealth: Payer: Self-pay

## 2018-06-08 NOTE — Telephone Encounter (Signed)
Is there a way we can move this appointment forward? I really need to see her for something like this.

## 2018-06-09 ENCOUNTER — Ambulatory Visit: Payer: PRIVATE HEALTH INSURANCE | Admitting: Nurse Practitioner

## 2018-06-13 ENCOUNTER — Ambulatory Visit: Payer: PRIVATE HEALTH INSURANCE | Admitting: Nurse Practitioner

## 2018-06-22 ENCOUNTER — Telehealth: Payer: Self-pay

## 2018-06-23 NOTE — Telephone Encounter (Signed)
Hey. Didn't we just get this approved by her insurance?

## 2018-06-23 NOTE — Telephone Encounter (Signed)
Thank you :)

## 2018-06-23 NOTE — Telephone Encounter (Signed)
Yes this medication was just approved on April 04 2018. I do not know if there was a limit issue with insurance it did not say, I did another prior auth to see what comes out of that.

## 2018-06-24 ENCOUNTER — Telehealth: Payer: Self-pay | Admitting: Internal Medicine

## 2018-06-24 NOTE — Telephone Encounter (Signed)
There Is no generic , that's just the response I got from insurance.

## 2018-06-24 NOTE — Telephone Encounter (Signed)
So, with no generic available, they should continue with amitiza as prescribed, right?

## 2018-06-24 NOTE — Telephone Encounter (Signed)
Amitiza 8MCG OR CAPS  Approvedon June 11 Approved for generic AMITIZA (Lubiprostone Capsule 8 MCG), quantity 60 per 30 days, under the pharmacy benefit. Generic substitution required when available. Pharmacy notified

## 2018-06-24 NOTE — Telephone Encounter (Signed)
Didn't know there was gneric. But will they change it and fill it for her?

## 2018-06-24 NOTE — Telephone Encounter (Signed)
That's correct.

## 2018-06-24 NOTE — Telephone Encounter (Signed)
Approvedon June 11 Approved for generic AMITIZA (Lubiprostone Capsule 8 MCG), quantity 60 per 30 days, under the pharmacy benefit. Generic substitution required when available. I notified the pharmacy

## 2018-07-11 ENCOUNTER — Telehealth: Payer: Self-pay

## 2018-07-11 NOTE — Telephone Encounter (Signed)
She does or does not want to get it by mail?

## 2018-07-12 NOTE — Telephone Encounter (Signed)
Pt advised called insurance find out about mail order and we can do on next visit

## 2018-07-12 NOTE — Telephone Encounter (Signed)
She does not want to get it by mail.

## 2018-07-22 ENCOUNTER — Other Ambulatory Visit: Payer: Self-pay

## 2018-07-22 ENCOUNTER — Ambulatory Visit: Payer: PRIVATE HEALTH INSURANCE | Admitting: Nurse Practitioner

## 2018-07-22 ENCOUNTER — Encounter: Payer: Self-pay | Admitting: Nurse Practitioner

## 2018-07-22 VITALS — BP 125/76 | Ht 65.5 in | Wt 164.0 lb

## 2018-07-22 DIAGNOSIS — F411 Generalized anxiety disorder: Secondary | ICD-10-CM

## 2018-07-22 DIAGNOSIS — F331 Major depressive disorder, recurrent, moderate: Secondary | ICD-10-CM | POA: Diagnosis not present

## 2018-07-22 DIAGNOSIS — Z1239 Encounter for other screening for malignant neoplasm of breast: Secondary | ICD-10-CM

## 2018-07-22 DIAGNOSIS — K581 Irritable bowel syndrome with constipation: Secondary | ICD-10-CM | POA: Diagnosis not present

## 2018-07-22 MED ORDER — LUBIPROSTONE 24 MCG PO CAPS: 24.0000 ug | ORAL_CAPSULE | Freq: Two times a day (BID) | ORAL | 1 refills | Status: DC

## 2018-07-22 MED ORDER — BUPROPION HCL ER (SR) 200 MG PO TB12: 200.0000 mg | ORAL_TABLET | Freq: Two times a day (BID) | ORAL | 5 refills | Status: DC

## 2018-07-22 MED ORDER — ALPRAZOLAM 0.5 MG PO TABS: ORAL_TABLET | ORAL | 3 refills | Status: DC

## 2018-07-22 NOTE — Progress Notes (Signed)
[facility] [place], [place] [address]  Internal MEDICINE  Telephone Visit  Patient Name: Amanda Mcgee  [id number]  [id number]  Date of Service: 07/22/2018  I connected with the patient at 9:47am by webcam and verified the patients identity using two identifiers.   I discussed the limitations, risks, security and privacy concerns of performing an evaluation and management service by webcam and the availability of in person appointments. I also discussed with the patient that there may be a patient responsible charge related to the service.  The patient expressed understanding and agrees to proceed.    Chief Complaint  Patient presents with  . Telephone Assessment  . Telephone Screen  . Hyperlipidemia  . Hypertension  . Anxiety    The patient has been contacted via webcam for follow up visit due to concerns for spread of novel coronavirus. She is having issues with chronic constipation. Was taking amitiza 50mcg which worked very well for some time. She was given samples for amitiza 97mcg which she is taking twice daily and these  Are helping her a great deal. She needs to have new prescription sent to mail in pharmacy. She states that her depression seems to be getting beter and is doing well on current medications. She does need to have a refills for wellbutrin and alprazolam today.       Current Medication: Outpatient Encounter Medications as of 07/22/2018  Medication Sig  . albuterol (VENTOLIN HFA) 108 (90 Base) MCG/ACT inhaler Inhale 2 puffs into the lungs every 6 (six) hours as needed for wheezing or shortness of breath.  . ALPRAZolam (XANAX) 0.5 MG tablet Take 1 tablet po QAM and 1 to 2 tablets po QHS  . buPROPion (WELLBUTRIN SR) 200 MG 12 hr tablet Take 1 tablet (200 mg total) by mouth 2 (two) times daily.  . calcium carbonate (CALCIUM 600) 600 MG TABS tablet Take 600 mg by mouth 2 (two) times daily with a meal.  . Cholecalciferol (VITAMIN D3) 400  units CAPS Take by mouth daily.  Marland Kitchen CRANBERRY PO Take 1 tablet by mouth 2 (two) times daily.  . cyclobenzaprine (FLEXERIL) 10 MG tablet Take 10 mg by mouth 2 (two) times daily as needed for muscle spasms.  Marland Kitchen desvenlafaxine (PRISTIQ) 100 MG 24 hr tablet Take 1 tablet (100 mg total) by mouth daily.  Marland Kitchen docusate sodium (COLACE) 100 MG capsule Take 100 mg by mouth 2 (two) times daily as needed for mild constipation.  . fluticasone (FLONASE) 50 MCG/ACT nasal spray Place 1 spray into both nostrils daily.  Marland Kitchen levocetirizine (XYZAL) 5 MG tablet Take 1 tablet (5 mg total) by mouth daily.  Marland Kitchen lisinopril (PRINIVIL,ZESTRIL) 10 MG tablet Take 1 tablet (10 mg total) by mouth daily.  Marland Kitchen lubiprostone (AMITIZA) 24 MCG capsule Take 1 capsule (24 mcg total) by mouth 2 (two) times daily with a meal.  . meclizine (ANTIVERT) 25 MG tablet Take twice daily as needed for dizziness  . tamsulosin (FLOMAX) 0.4 MG CAPS capsule Take 1 capsule (0.4 mg total) by mouth daily after breakfast.  . [DISCONTINUED] ALPRAZolam (XANAX) 0.5 MG tablet Take 1 tablet po QAM and 1 to 2 tablets po QHS  . [DISCONTINUED] AMITIZA 8 MCG capsule TAKE 1 CAPSULE (8 MCG TOTAL) BY MOUTH 2 (TWO) TIMES DAILY WITH A MEAL.  . [DISCONTINUED] buPROPion (WELLBUTRIN SR) 200 MG 12 hr tablet Take 1 tablet (200 mg total) by mouth 2 (two) times daily.  . [DISCONTINUED] lubiprostone (AMITIZA) 24 MCG  capsule Take 1 capsule (24 mcg total) by mouth 2 (two) times daily with a meal.  . imipramine (TOFRANIL) 25 MG tablet Take 25 mg by mouth at bedtime.   No facility-administered encounter medications on file as of 07/22/2018.     Surgical History: Past Surgical History:  Procedure Laterality Date  . ABDOMINAL HYSTERECTOMY  1990  . APPENDECTOMY  1969  . BLADDER SURGERY  2011  . BREAST BIOPSY Left    benign  . COLONOSCOPY WITH PROPOFOL N/A 02/01/2018   Procedure: COLONOSCOPY WITH PROPOFOL;  Surgeon: [name], MD;  Location[facility] ENDOSCOPY;  Service: Gastroenterology;   Laterality: N/A;  . LITHOTRIPSY  06/2011    Medical History: Past Medical History:  Diagnosis Date  . Anxiety   . Arthritis   . Depression   . HTN (hypertension)   . Hyperlipidemia   . Kidney stones 2013    Family History: Family History  Problem Relation Age of Onset  . Breast cancer Mother 40    Social History   Socioeconomic History  . Marital status: Married    Spouse name: Not on file  . Number of children: Not on file  . Years of education: Not on file  . Highest education level: Not on file  Occupational History  . Not on file  Social Needs  . Financial resource strain: Not on file  . Food insecurity    Worry: Not on file    Inability: Not on file  . Transportation needs    Medical: Not on file    Non-medical: Not on file  Tobacco Use  . Smoking status: Current Every Day Smoker    Packs/day: 0.50    Years: 30.00    Pack years: 15.00    Types: Cigarettes  . Smokeless tobacco: Never Used  Substance and Sexual Activity  . Alcohol use: Yes    Comment: ocassionally  . Drug use: No  . Sexual activity: Never  Lifestyle  . Physical activity    Days per week: Not on file    Minutes per session: Not on file  . Stress: Not on file  Relationships  . Social Herbalist on phone: Not on file    Gets together: Not on file    Attends religious service: Not on file    Active member of club or organization: Not on file    Attends meetings of clubs or organizations: Not on file    Relationship status: Not on file  . Intimate partner violence    Fear of current or ex partner: Not on file    Emotionally abused: Not on file    Physically abused: Not on file    Forced sexual activity: Not on file  Other Topics Concern  . Not on file  Social History Narrative  . Not on file      Review of Systems  Constitutional: Negative for chills, fatigue and unexpected weight change.  HENT: Negative for congestion, rhinorrhea, sneezing and sore throat.    Respiratory: Negative for cough, chest tightness, shortness of breath and wheezing.   Cardiovascular: Negative for chest pain and palpitations.  Gastrointestinal: Positive for constipation. Negative for abdominal pain, diarrhea, nausea and vomiting.  Endocrine: Negative for cold intolerance, heat intolerance and polyuria.  Musculoskeletal: Negative for arthralgias, back pain, joint swelling and neck pain.  Skin: Negative for rash.  Allergic/Immunologic: Negative for environmental allergies.  Neurological: Negative for dizziness, tremors, numbness and headaches.  Hematological: Negative for adenopathy. Does  not bruise/bleed easily.  Psychiatric/Behavioral: Positive for dysphoric mood. Negative for behavioral problems and sleep disturbance. The patient is nervous/anxious.     Today's Vitals   07/22/18 0926  BP: 125/76  Weight: 164 lb (74.4 kg)  Height: 5' 5.5" (1.664 m)   Body mass index is 26.88 kg/m.  Observation/Objective:   The patient is alert and oriented. She is pleasant and answers all questions appropriately. Breathing is non-labored. She is in no acute distress at this time.    Assessment/Plan:  1. Irritable bowel syndrome with constipation Increase dose of amitixa to 59mcg twice daily. New prescription sent to mail order pharmacy.  - lubiprostone (AMITIZA) 24 MCG capsule; Take 1 capsule (24 mcg total) by mouth 2 (two) times daily with a meal.  Dispense: 180 capsule; Refill: 1  2. Major depressive disorder, recurrent, moderate (HCC) Stable. Continue pristiq as prescribe.d   3. Generalized anxiety disorder Improved. Continue bupropion 200mg  twice daily. May take alprazolam as needed and as prescribed for acute anxiety. New prescriptions for both were sent to her pharmacy.  - ALPRAZolam Duanne Moron) 0.5 MG tablet; Take 1 tablet po QAM and 1 to 2 tablets po QHS  Dispense: 90 tablet; Refill: 3 - buPROPion (WELLBUTRIN SR) 200 MG 12 hr tablet; Take 1 tablet (200 mg total) by  mouth 2 (two) times daily.  Dispense: 180 tablet; Refill: 5  4. Screening for breast cancer - MM DIGITAL SCREENING BILATERAL; Future  General Counseling: matea stanard understanding of the findings of today's phone visit and agrees with plan of treatment. I have discussed any further diagnostic evaluation that may be needed or ordered today. We also reviewed her medications today. she has been encouraged to call the office with any questions or concerns that should arise related to todays visit.  This patient was seen by Leretha Pol FNP Collaboration with Dr Lavera Guise as a part of collaborative care agreement  Orders Placed This Encounter  Procedures  . MM DIGITAL SCREENING BILATERAL    Meds ordered this encounter  Medications  . ALPRAZolam (XANAX) 0.5 MG tablet    Sig: Take 1 tablet po QAM and 1 to 2 tablets po QHS    Dispense:  90 tablet    Refill:  3    Change back to 0.5mg  tablets for regular dosing    Order Specific Question:   Supervising Provider    Answer:   Lavera Guise [place]  . buPROPion (WELLBUTRIN SR) 200 MG 12 hr tablet    Sig: Take 1 tablet (200 mg total) by mouth 2 (two) times daily.    Dispense:  180 tablet    Refill:  5    Order Specific Question:   Supervising Provider    Answer:   Lavera Guise [[id number]]  . DISCONTD: lubiprostone (AMITIZA) 24 MCG capsule    Sig: Take 1 capsule (24 mcg total) by mouth 2 (two) times daily with a meal.    Dispense:  180 capsule    Refill:  1    Order Specific Question:   Supervising Provider    Answer:   Lavera Guise [place]  . lubiprostone (AMITIZA) 24 MCG capsule    Sig: Take 1 capsule (24 mcg total) by mouth 2 (two) times daily with a meal.    Dispense:  180 capsule    Refill:  1    Order Specific Question:   Supervising Provider    Answer:   Lavera Guise [[id number]]    Time spent:  [place] [facility]

## 2018-08-03 ENCOUNTER — Telehealth: Payer: Self-pay

## 2018-08-03 ENCOUNTER — Other Ambulatory Visit: Payer: Self-pay

## 2018-08-03 DIAGNOSIS — K581 Irritable bowel syndrome with constipation: Secondary | ICD-10-CM

## 2018-08-03 MED ORDER — LUBIPROSTONE 24 MCG PO CAPS: 24.0000 ug | ORAL_CAPSULE | Freq: Two times a day (BID) | ORAL | 0 refills | Status: DC

## 2018-08-03 NOTE — Telephone Encounter (Signed)
Send 10 days amitza to [facility] due to delayed by mail order as per heather is ok

## 2018-08-15 ENCOUNTER — Other Ambulatory Visit: Payer: Self-pay

## 2018-08-15 DIAGNOSIS — J309 Allergic rhinitis, unspecified: Secondary | ICD-10-CM

## 2018-08-15 MED ORDER — LEVOCETIRIZINE DIHYDROCHLORIDE 5 MG PO TABS: 5.0000 mg | ORAL_TABLET | Freq: Every day | ORAL | 1 refills | Status: DC

## 2018-08-17 ENCOUNTER — Other Ambulatory Visit: Payer: Self-pay

## 2018-08-17 DIAGNOSIS — J300 Vasomotor rhinitis: Secondary | ICD-10-CM

## 2018-08-17 MED ORDER — FLUTICASONE PROPIONATE 50 MCG/ACT NA SUSP: 1.0000 | Freq: Every day | NASAL | 6 refills | Status: DC

## 2018-10-12 ENCOUNTER — Other Ambulatory Visit: Payer: Self-pay | Admitting: Adult Health

## 2018-10-12 DIAGNOSIS — N329 Bladder disorder, unspecified: Secondary | ICD-10-CM

## 2018-10-25 ENCOUNTER — Encounter: Payer: Self-pay | Admitting: Nurse Practitioner

## 2018-10-25 ENCOUNTER — Other Ambulatory Visit: Payer: Self-pay

## 2018-10-25 ENCOUNTER — Ambulatory Visit (INDEPENDENT_AMBULATORY_CARE_PROVIDER_SITE_OTHER): Payer: PRIVATE HEALTH INSURANCE | Admitting: Nurse Practitioner

## 2018-10-25 VITALS — BP 130/84 | HR 99 | Temp 97.2°F | Resp 16 | Ht 65.0 in | Wt 163.0 lb

## 2018-10-25 DIAGNOSIS — I10 Essential (primary) hypertension: Secondary | ICD-10-CM | POA: Diagnosis not present

## 2018-10-25 DIAGNOSIS — Z0001 Encounter for general adult medical examination with abnormal findings: Secondary | ICD-10-CM

## 2018-10-25 DIAGNOSIS — F411 Generalized anxiety disorder: Secondary | ICD-10-CM

## 2018-10-25 DIAGNOSIS — F331 Major depressive disorder, recurrent, moderate: Secondary | ICD-10-CM | POA: Diagnosis not present

## 2018-10-25 DIAGNOSIS — K581 Irritable bowel syndrome with constipation: Secondary | ICD-10-CM | POA: Diagnosis not present

## 2018-10-25 DIAGNOSIS — R300 Dysuria: Secondary | ICD-10-CM

## 2018-10-25 MED ORDER — DESVENLAFAXINE SUCCINATE ER 100 MG PO TB24: 100.0000 mg | ORAL_TABLET | Freq: Every day | ORAL | 1 refills | Status: DC

## 2018-10-25 MED ORDER — ALPRAZOLAM 0.5 MG PO TABS: ORAL_TABLET | ORAL | 3 refills | Status: DC

## 2018-10-25 MED ORDER — LISINOPRIL 10 MG PO TABS: 10.0000 mg | ORAL_TABLET | Freq: Every day | ORAL | 3 refills | Status: DC

## 2018-10-25 NOTE — Progress Notes (Signed)
[facility] [place], [place] [address]  Internal MEDICINE  Office Visit Note  Patient Name: Amanda Mcgee  [id number]  [id number]  Date of Service: 11/06/2018   Pt is here for routine health maintenance examination  Chief Complaint  Patient presents with  . Annual Exam  . Hypertension  . Hyperlipidemia  . Medication Management    pt is taking amitiza once daily and twice every other day, did you want to change?; pt would a like a sample of proair, not sure what dose     The patient is here for health maintenance exam. She is managing her anxiety and depression well with current medication. Her blood pressure is well managed. She states that [place] continues to work well. She generally is taking this once daily. Will take twice daily when constipation gets worse. She states that she feels well overall, and she has no new concerns or complaints.     Current Medication: Outpatient Encounter Medications as of 10/25/2018  Medication Sig  . albuterol (VENTOLIN HFA) 108 (90 Base) MCG/ACT inhaler Inhale 2 puffs into the lungs every 6 (six) hours as needed for wheezing or shortness of breath.  . ALPRAZolam (XANAX) 0.5 MG tablet Take 1 tablet po QAM and 1 to 2 tablets po QHS  . buPROPion (WELLBUTRIN SR) 200 MG 12 hr tablet Take 1 tablet (200 mg total) by mouth 2 (two) times daily.  . calcium carbonate (CALCIUM 600) 600 MG TABS tablet Take 600 mg by mouth 2 (two) times daily with a meal.  . Cholecalciferol (VITAMIN D3) 400 units CAPS Take by mouth daily.  Marland Kitchen CRANBERRY PO Take 1 tablet by mouth 2 (two) times daily.  . cyclobenzaprine (FLEXERIL) 10 MG tablet Take 10 mg by mouth 2 (two) times daily as needed for muscle spasms.  Marland Kitchen desvenlafaxine (PRISTIQ) 100 MG 24 hr tablet Take 1 tablet (100 mg total) by mouth daily.  Marland Kitchen docusate sodium (COLACE) 100 MG capsule Take 100 mg by mouth 2 (two) times daily as needed for mild constipation.  . fluticasone (FLONASE) 50  MCG/ACT nasal spray Place 1 spray into both nostrils daily.  Marland Kitchen levocetirizine (XYZAL) 5 MG tablet Take 1 tablet (5 mg total) by mouth daily.  Marland Kitchen lisinopril (ZESTRIL) 10 MG tablet Take 1 tablet (10 mg total) by mouth daily.  Marland Kitchen lubiprostone (AMITIZA) 24 MCG capsule Take 1 capsule (24 mcg total) by mouth 2 (two) times daily with a meal.  . meclizine (ANTIVERT) 25 MG tablet Take twice daily as needed for dizziness  . tamsulosin (FLOMAX) 0.4 MG CAPS capsule TAKE 1 CAPSULE (0.4 MG TOTAL) BY MOUTH DAILY AFTER BREAKFAST.  . [DISCONTINUED] ALPRAZolam (XANAX) 0.5 MG tablet Take 1 tablet po QAM and 1 to 2 tablets po QHS  . [DISCONTINUED] desvenlafaxine (PRISTIQ) 100 MG 24 hr tablet Take 1 tablet (100 mg total) by mouth daily.  . [DISCONTINUED] imipramine (TOFRANIL) 25 MG tablet Take 25 mg by mouth at bedtime.  . [DISCONTINUED] lisinopril (PRINIVIL,ZESTRIL) 10 MG tablet Take 1 tablet (10 mg total) by mouth daily.   No facility-administered encounter medications on file as of 10/25/2018.     Surgical History: Past Surgical History:  Procedure Laterality Date  . ABDOMINAL HYSTERECTOMY  1990  . APPENDECTOMY  1969  . BLADDER SURGERY  2011  . BREAST BIOPSY Left    benign  . COLONOSCOPY WITH PROPOFOL N/A 02/01/2018   Procedure: COLONOSCOPY WITH PROPOFOL;  Surgeon: [name], MD;  Location: [facility];  Service: Gastroenterology;  Laterality: N/A;  . LITHOTRIPSY  06/2011    Medical History: Past Medical History:  Diagnosis Date  . Anxiety   . Arthritis   . Depression   . HTN (hypertension)   . Hyperlipidemia   . Kidney stones 2013    Family History: Family History  Problem Relation Age of Onset  . Breast cancer Mother 77      Review of Systems  Constitutional: Negative for activity change, chills, fatigue and unexpected weight change.  HENT: Negative for congestion, rhinorrhea, sneezing and sore throat.   Respiratory: Negative for cough, chest tightness, shortness of breath and  wheezing.   Cardiovascular: Negative for chest pain and palpitations.  Gastrointestinal: Positive for constipation. Negative for abdominal distention, abdominal pain, diarrhea, nausea and vomiting.  Endocrine: Negative for cold intolerance, heat intolerance, polydipsia and polyuria.  Genitourinary: Positive for frequency and urgency.  Musculoskeletal: Negative for arthralgias, back pain, joint swelling and neck pain.  Skin: Negative for rash.  Allergic/Immunologic: Negative for environmental allergies.  Neurological: Negative for dizziness, tremors, numbness and headaches.  Hematological: Negative for adenopathy. Does not bruise/bleed easily.  Psychiatric/Behavioral: Positive for dysphoric mood. Negative for behavioral problems and sleep disturbance. The patient is nervous/anxious.     Today's Vitals   10/25/18 1502  BP: 130/84  Pulse: 99  Resp: 16  Temp: (!) 97.2 F (36.2 C)  SpO2: 97%  Weight: 163 lb (73.9 kg)  Height: 5\' 5"  (1.651 m)   Body mass index is 27.12 kg/m.   Physical Exam Vitals signs and nursing note reviewed.  Constitutional:      General: She is not in acute distress.    Appearance: Normal appearance. She is well-developed. She is not diaphoretic.  HENT:     Head: Normocephalic and atraumatic.     Right Ear: External ear normal.     Left Ear: External ear normal.     Nose: Nose normal.     Mouth/Throat:     Pharynx: No oropharyngeal exudate.  Eyes:     Pupils: Pupils are equal, round, and reactive to light.  Neck:     Musculoskeletal: Normal range of motion and neck supple.     Thyroid: No thyromegaly.     Vascular: No carotid bruit or JVD.     Trachea: No tracheal deviation.  Cardiovascular:     Rate and Rhythm: Normal rate and regular rhythm.     Pulses: Normal pulses.     Heart sounds: Normal heart sounds. No murmur. No friction rub. No gallop.   Pulmonary:     Effort: Pulmonary effort is normal. No respiratory distress.     Breath sounds:  Normal breath sounds. No wheezing or rales.  Chest:     Chest wall: No tenderness.     Breasts:        Right: Normal. No swelling, bleeding, inverted nipple, mass, nipple discharge, skin change or tenderness.        Left: Normal. No swelling, bleeding, inverted nipple, mass, nipple discharge, skin change or tenderness.  Abdominal:     General: Bowel sounds are normal.     Palpations: Abdomen is soft.     Tenderness: There is no abdominal tenderness.  Musculoskeletal: Normal range of motion.  Lymphadenopathy:     Cervical: No cervical adenopathy.  Skin:    General: Skin is warm and dry.  Neurological:     Mental Status: She is alert and oriented to person, place, and time.     Cranial  Nerves: No cranial nerve deficit.  Psychiatric:        Mood and Affect: Mood is anxious.        Speech: Speech normal.        Behavior: Behavior normal.        Thought Content: Thought content normal.        Judgment: Judgment normal.     LABS: Recent Results (from the past 2160 hour(s))  UA/M w/rflx Culture, Routine     Status: Abnormal   Collection Time: 10/25/18  3:05 PM   Specimen: Urine   URINE  Result Value Ref Range   Specific Gravity, UA 1.008 1.005 - 1.030   pH, UA 7.5 5.0 - 7.5   Color, UA Yellow Yellow   Appearance Ur Clear Clear   Leukocytes,UA Negative Negative   Protein,UA Negative Negative/Trace   Glucose, UA Negative Negative   Ketones, UA Negative Negative   RBC, UA 2+ (A) Negative   Bilirubin, UA Negative Negative   Urobilinogen, Ur 0.2 0.2 - 1.0 mg/dL   Nitrite, UA Negative Negative   Microscopic Examination See below:     Comment: Microscopic was indicated and was performed.   Urinalysis Reflex Comment     Comment: This specimen will not reflex to a Urine Culture.  Microscopic Examination     Status: Abnormal   Collection Time: 10/25/18  3:05 PM   URINE  Result Value Ref Range   WBC, UA 0-5 0 - 5 /hpf   RBC 11-30 (A) 0 - 2 /hpf   Epithelial Cells (non renal)  0-10 0 - 10 /hpf   Casts None seen None seen /lpf   Mucus, UA Present Not Estab.   Bacteria, UA Few None seen/Few    Assessment/Plan:  1. Encounter for general adult medical examination with abnormal findings Annual health maintenance exam today  2. Essential hypertension Stable. Continue lisinopril 10mg  daily.  - lisinopril (ZESTRIL) 10 MG tablet; Take 1 tablet (10 mg total) by mouth daily.  Dispense: 90 tablet; Refill: 3  3. Irritable bowel syndrome with constipation Continue amitiza as prescribed.   4. Major depressive disorder, recurrent, moderate (HCC) Continue pristiq and bupropion as prescribed.  - desvenlafaxine (PRISTIQ) 100 MG 24 hr tablet; Take 1 tablet (100 mg total) by mouth daily.  Dispense: 90 tablet; Refill: 1  5. Generalized anxiety disorder May continue to take alprazolam as needed and as prescribed. New prescription sent to her pharmacy today.  - ALPRAZolam (XANAX) 0.5 MG tablet; Take 1 tablet po QAM and 1 to 2 tablets po QHS  Dispense: 90 tablet; Refill: 3  6. Dysuria - UA/M w/rflx Culture, Routine  General Counseling: Dazhane verbalizes understanding of the findings of todays visit and agrees with plan of treatment. I have discussed any further diagnostic evaluation that may be needed or ordered today. We also reviewed her medications today. she has been encouraged to call the office with any questions or concerns that should arise related to todays visit.    Counseling:  This patient was seen by Leretha Pol FNP Collaboration with Dr Lavera Guise as a part of collaborative care agreement  Orders Placed This Encounter  Procedures  . Microscopic Examination  . UA/M w/rflx Culture, Routine    Meds ordered this encounter  Medications  . lisinopril (ZESTRIL) 10 MG tablet    Sig: Take 1 tablet (10 mg total) by mouth daily.    Dispense:  90 tablet    Refill:  3    Order  Specific Question:   Supervising Provider    Answer:   Lavera Guise [id number]  .  desvenlafaxine (PRISTIQ) 100 MG 24 hr tablet    Sig: Take 1 tablet (100 mg total) by mouth daily.    Dispense:  90 tablet    Refill:  1    Ok to fill as 90 day prescription    Order Specific Question:   Supervising Provider    Answer:   Lavera Guise [place]  . ALPRAZolam (XANAX) 0.5 MG tablet    Sig: Take 1 tablet po QAM and 1 to 2 tablets po QHS    Dispense:  90 tablet    Refill:  3    Change back to 0.5mg  tablets for regular dosing    Order Specific Question:   Supervising Provider    Answer:   Lavera Guise [id number]    Time spent: [place], MD  Internal Medicine

## 2018-10-26 LAB — MICROSCOPIC EXAMINATION: Casts: NONE SEEN /lpf

## 2018-10-26 LAB — UA/M W/RFLX CULTURE, ROUTINE
Bilirubin, UA: NEGATIVE
Glucose, UA: NEGATIVE
Ketones, UA: NEGATIVE
Leukocytes,UA: NEGATIVE
Nitrite, UA: NEGATIVE
Protein,UA: NEGATIVE
Specific Gravity, UA: 1.008 (ref 1.005–1.030)
Urobilinogen, Ur: 0.2 mg/dL (ref 0.2–1.0)
pH, UA: 7.5 (ref 5.0–7.5)

## 2018-11-06 DIAGNOSIS — Z0001 Encounter for general adult medical examination with abnormal findings: Secondary | ICD-10-CM | POA: Insufficient documentation

## 2019-01-16 ENCOUNTER — Ambulatory Visit
Admission: RE | Admit: 2019-01-16 | Discharge: 2019-01-16 | Disposition: A | Discharge status: Home / Self Care | Payer: 59 | Source: Ambulatory Visit | Attending: Nurse Practitioner | Admitting: Nurse Practitioner

## 2019-01-16 DIAGNOSIS — Z1239 Encounter for other screening for malignant neoplasm of breast: Secondary | ICD-10-CM | POA: Diagnosis present

## 2019-01-16 DIAGNOSIS — Z1231 Encounter for screening mammogram for malignant neoplasm of breast: Secondary | ICD-10-CM | POA: Diagnosis not present

## 2019-01-16 NOTE — Progress Notes (Signed)
Negative mammogram

## 2019-02-06 ENCOUNTER — Other Ambulatory Visit: Payer: Self-pay

## 2019-02-06 DIAGNOSIS — J309 Allergic rhinitis, unspecified: Secondary | ICD-10-CM

## 2019-02-06 MED ORDER — LEVOCETIRIZINE DIHYDROCHLORIDE 5 MG PO TABS: 5.0000 mg | ORAL_TABLET | Freq: Every day | ORAL | 1 refills | Status: DC

## 2019-02-10 ENCOUNTER — Other Ambulatory Visit: Payer: Self-pay | Admitting: Nurse Practitioner

## 2019-02-10 ENCOUNTER — Telehealth: Payer: Self-pay

## 2019-02-10 DIAGNOSIS — F411 Generalized anxiety disorder: Secondary | ICD-10-CM

## 2019-02-10 MED ORDER — ALPRAZOLAM 0.5 MG PO TABS: ORAL_TABLET | ORAL | 3 refills | Status: DC

## 2019-02-10 NOTE — Progress Notes (Signed)
Refilled prescription for alprazolam 0.5mg  tablets and sent to her pharmacy .

## 2019-02-10 NOTE — Telephone Encounter (Signed)
Refilled this prescription for her and sent to her pharmacy

## 2019-02-10 NOTE — Telephone Encounter (Signed)
Pt advised we send med keep appt

## 2019-02-23 ENCOUNTER — Telehealth: Payer: Self-pay

## 2019-02-23 NOTE — Telephone Encounter (Signed)
CONFIRMED AND SCREENED FOR 02-27-19 OV 

## 2019-02-27 ENCOUNTER — Encounter: Payer: Self-pay | Admitting: Nurse Practitioner

## 2019-02-27 ENCOUNTER — Other Ambulatory Visit: Payer: Self-pay

## 2019-02-27 ENCOUNTER — Ambulatory Visit (INDEPENDENT_AMBULATORY_CARE_PROVIDER_SITE_OTHER): Payer: 59 | Admitting: Nurse Practitioner

## 2019-02-27 VITALS — BP 141/90 | HR 76 | Temp 97.4°F | Resp 16 | Ht 65.0 in | Wt 167.0 lb

## 2019-02-27 DIAGNOSIS — F331 Major depressive disorder, recurrent, moderate: Secondary | ICD-10-CM

## 2019-02-27 DIAGNOSIS — K581 Irritable bowel syndrome with constipation: Secondary | ICD-10-CM | POA: Diagnosis not present

## 2019-02-27 DIAGNOSIS — Z79899 Other long term (current) drug therapy: Secondary | ICD-10-CM

## 2019-02-27 DIAGNOSIS — I10 Essential (primary) hypertension: Secondary | ICD-10-CM | POA: Diagnosis not present

## 2019-02-27 DIAGNOSIS — M21612 Bunion of left foot: Secondary | ICD-10-CM

## 2019-02-27 DIAGNOSIS — L84 Corns and callosities: Secondary | ICD-10-CM | POA: Diagnosis not present

## 2019-02-27 DIAGNOSIS — F411 Generalized anxiety disorder: Secondary | ICD-10-CM

## 2019-02-27 LAB — POCT URINE DRUG SCREEN
POC Amphetamine UR: NOT DETECTED
POC BENZODIAZEPINES UR: NOT DETECTED
POC Barbiturate UR: NOT DETECTED
POC Cocaine UR: NOT DETECTED
POC Ecstasy UR: NOT DETECTED
POC Marijuana UR: NOT DETECTED
POC Methadone UR: NOT DETECTED
POC Methamphetamine UR: NOT DETECTED
POC Opiate Ur: NOT DETECTED
POC Oxycodone UR: NOT DETECTED
POC PHENCYCLIDINE UR: NOT DETECTED
POC TRICYCLICS UR: NOT DETECTED

## 2019-02-27 NOTE — Progress Notes (Signed)
[facility] [place], [place] [address]  Internal MEDICINE  Office Visit Note  Patient Name: Amanda Mcgee  [id number]  [id number]  Date of Service: 03/01/2019  Chief Complaint  Patient presents with  . Hypertension  . Hyperlipidemia  . Anxiety  . Depression  . Toe Pain    left side, has a corn   . Ear Pain    right ear     The patient is here for routine follow up. The patient has a corn on the bottom of her fourth toe of the left foot. Is "sharp" and painful. Hurts to walk in multiple pairs of shoes. Also has bunion on the left foot, just below the great toe. She has some pain in the right ear. States that this has been going on for some time. Gets water in her ears during a shower. Has been using ear plugs to help keep water out of it. Currently using some sweet oil which has helped a little. She needs to have routine, fasting labs done. She had mammogram 01/16/2019 and it was negative.       Current Medication: Outpatient Encounter Medications as of 02/27/2019  Medication Sig  . albuterol (VENTOLIN HFA) 108 (90 Base) MCG/ACT inhaler Inhale 2 puffs into the lungs every 6 (six) hours as needed for wheezing or shortness of breath.  . ALPRAZolam (XANAX) 0.5 MG tablet Take 1 tablet po QAM and 1 to 2 tablets po QHS  . buPROPion (WELLBUTRIN SR) 200 MG 12 hr tablet Take 1 tablet (200 mg total) by mouth 2 (two) times daily.  . calcium carbonate (CALCIUM 600) 600 MG TABS tablet Take 600 mg by mouth 2 (two) times daily with a meal.  . Cholecalciferol (VITAMIN D3) 400 units CAPS Take by mouth daily.  Marland Kitchen CRANBERRY PO Take 1 tablet by mouth 2 (two) times daily.  . cyclobenzaprine (FLEXERIL) 10 MG tablet Take 10 mg by mouth 2 (two) times daily as needed for muscle spasms.  Marland Kitchen desvenlafaxine (PRISTIQ) 100 MG 24 hr tablet Take 1 tablet (100 mg total) by mouth daily.  Marland Kitchen docusate sodium (COLACE) 100 MG capsule Take 100 mg by mouth 2 (two) times daily as needed for mild  constipation.  . fluticasone (FLONASE) 50 MCG/ACT nasal spray Place 1 spray into both nostrils daily.  Marland Kitchen levocetirizine (XYZAL) 5 MG tablet Take 1 tablet (5 mg total) by mouth daily.  Marland Kitchen lisinopril (ZESTRIL) 10 MG tablet Take 1 tablet (10 mg total) by mouth daily.  Marland Kitchen lubiprostone (AMITIZA) 24 MCG capsule Take 1 capsule (24 mcg total) by mouth 2 (two) times daily with a meal.  . meclizine (ANTIVERT) 25 MG tablet Take twice daily as needed for dizziness  . tamsulosin (FLOMAX) 0.4 MG CAPS capsule TAKE 1 CAPSULE (0.4 MG TOTAL) BY MOUTH DAILY AFTER BREAKFAST.   No facility-administered encounter medications on file as of 02/27/2019.    Surgical History: Past Surgical History:  Procedure Laterality Date  . ABDOMINAL HYSTERECTOMY  1990  . APPENDECTOMY  1969  . BLADDER SURGERY  2011  . BREAST BIOPSY Left    benign  . COLONOSCOPY WITH PROPOFOL N/A 02/01/2018   Procedure: COLONOSCOPY WITH PROPOFOL;  Surgeon: [name], MD;  Location: [facility] ENDOSCOPY;  Service: Gastroenterology;  Laterality: N/A;  . LITHOTRIPSY  06/2011    Medical History: Past Medical History:  Diagnosis Date  . Anxiety   . Arthritis   . Depression   . HTN (hypertension)   . Hyperlipidemia   .  Kidney stones 2013    Family History: Family History  Problem Relation Age of Onset  . Breast cancer Mother 28    Social History   Socioeconomic History  . Marital status: Married    Spouse name: Not on file  . Number of children: Not on file  . Years of education: Not on file  . Highest education level: Not on file  Occupational History  . Not on file  Tobacco Use  . Smoking status: Current Every Day Smoker    Packs/day: 0.50    Years: 30.00    Pack years: 15.00    Types: Cigarettes  . Smokeless tobacco: Never Used  Substance and Sexual Activity  . Alcohol use: Not Currently  . Drug use: No  . Sexual activity: Never  Other Topics Concern  . Not on file  Social History Narrative  . Not on file   Social  Determinants of Health   Financial Resource Strain:   . Difficulty of Paying Living Expenses: Not on file  Food Insecurity:   . Worried About Charity fundraiser in the Last Year: Not on file  . Ran Out of Food in the Last Year: Not on file  Transportation Needs:   . Lack of Transportation (Medical): Not on file  . Lack of Transportation (Non-Medical): Not on file  Physical Activity:   . Days of Exercise per Week: Not on file  . Minutes of Exercise per Session: Not on file  Stress:   . Feeling of Stress : Not on file  Social Connections:   . Frequency of Communication with Friends and Family: Not on file  . Frequency of Social Gatherings with Friends and Family: Not on file  . Attends Religious Services: Not on file  . Active Member of Clubs or Organizations: Not on file  . Attends Archivist Meetings: Not on file  . Marital Status: Not on file  Intimate Partner Violence:   . Fear of Current or Ex-Partner: Not on file  . Emotionally Abused: Not on file  . Physically Abused: Not on file  . Sexually Abused: Not on file      Review of Systems  Constitutional: Negative for activity change, chills, fatigue and unexpected weight change.  HENT: Negative for congestion, rhinorrhea, sneezing and sore throat.   Respiratory: Negative for cough, chest tightness, shortness of breath and wheezing.   Cardiovascular: Negative for chest pain and palpitations.  Gastrointestinal: Positive for constipation. Negative for abdominal distention, abdominal pain, diarrhea, nausea and vomiting.       Constipation is doing well with current dose amitiza   Endocrine: Negative for cold intolerance, heat intolerance, polydipsia and polyuria.  Genitourinary: Positive for frequency and urgency.       Routinely sees urology  Musculoskeletal: Negative for arthralgias, back pain, joint swelling and neck pain.       Corn on fourth toe of left foot and bunion present just below the great toe of the  left foot. Both tender.   Skin: Negative for rash.  Allergic/Immunologic: Negative for environmental allergies.  Neurological: Negative for dizziness, tremors, numbness and headaches.  Hematological: Negative for adenopathy. Does not bruise/bleed easily.  Psychiatric/Behavioral: Positive for dysphoric mood. Negative for behavioral problems and sleep disturbance. The patient is nervous/anxious.     Today's Vitals   02/27/19 0939  BP: (!) 141/90  Pulse: 76  Resp: 16  Temp: (!) 97.4 F (36.3 C)  SpO2: 98%  Weight: 167 lb (75.8 kg)  Height: 5\' 5"  (1.651 m)   Body mass index is 27.79 kg/m.  Physical Exam Vitals and nursing note reviewed.  Constitutional:      General: She is not in acute distress.    Appearance: Normal appearance. She is well-developed. She is not diaphoretic.  HENT:     Head: Normocephalic and atraumatic.     Mouth/Throat:     Pharynx: No oropharyngeal exudate.  Eyes:     Pupils: Pupils are equal, round, and reactive to light.  Neck:     Thyroid: No thyromegaly.     Vascular: No JVD.     Trachea: No tracheal deviation.  Cardiovascular:     Rate and Rhythm: Normal rate and regular rhythm.     Heart sounds: Normal heart sounds. No murmur. No friction rub. No gallop.   Pulmonary:     Effort: Pulmonary effort is normal. No respiratory distress.     Breath sounds: No wheezing or rales.  Chest:     Chest wall: No tenderness.  Abdominal:     General: Bowel sounds are normal.     Palpations: Abdomen is soft.  Musculoskeletal:        General: Normal range of motion.     Cervical back: Normal range of motion and neck supple.       Feet:  Lymphadenopathy:     Cervical: No cervical adenopathy.  Skin:    General: Skin is warm and dry.  Neurological:     Mental Status: She is alert and oriented to person, place, and time.     Cranial Nerves: No cranial nerve deficit.  Psychiatric:        Behavior: Behavior normal.        Thought Content: Thought content  normal.        Judgment: Judgment normal.    Assessment/Plan: 1. Essential hypertension Stable. Continue bp medication as prescribed   2. Irritable bowel syndrome with constipation Doing well with current dose Amitiza  3. Corn or callus 4th toe of left foot refer to podiatry for further evaluation.  - Ambulatory referral to Podiatry  4. Bunion of great toe of left foot Refer to podiatry for further evaluation.   5. Major depressive disorder, recurrent, moderate (HCC) Stabe. Continue wellbutrin and pristiq as prescribed. Refill as needed   6. Generalized anxiety disorder Ok to continue alprazolam 0.5mg  up to three times daily if needed for anxiety/insomnia.   7. Encounter for long-term (current) use of medications - POCT Urine Drug Screen negative for all controlled substances.   General Counseling: ayumi dip understanding of the findings of todays visit and agrees with plan of treatment. I have discussed any further diagnostic evaluation that may be needed or ordered today. We also reviewed her medications today. she has been encouraged to call the office with any questions or concerns that should arise related to todays visit.    Orders Placed This Encounter  Procedures  . Ambulatory referral to Podiatry  . POCT Urine Drug Screen   This patient was seen by [place] with Dr Lavera Guise as a part of collaborative care agreement   Total time spent: 30 Minutes   Time spent includes review of chart, medications, test results, and follow up plan with the patient.      Dr Lavera Guise Internal medicine

## 2019-03-01 DIAGNOSIS — L84 Corns and callosities: Secondary | ICD-10-CM | POA: Insufficient documentation

## 2019-03-01 DIAGNOSIS — M21612 Bunion of left foot: Secondary | ICD-10-CM | POA: Insufficient documentation

## 2019-03-02 ENCOUNTER — Other Ambulatory Visit: Payer: Self-pay

## 2019-03-02 DIAGNOSIS — J300 Vasomotor rhinitis: Secondary | ICD-10-CM

## 2019-03-02 MED ORDER — FLUTICASONE PROPIONATE 50 MCG/ACT NA SUSP: 1.0000 | Freq: Every day | NASAL | 6 refills | Status: DC

## 2019-03-10 ENCOUNTER — Other Ambulatory Visit: Payer: Self-pay

## 2019-03-10 DIAGNOSIS — N329 Bladder disorder, unspecified: Secondary | ICD-10-CM

## 2019-03-10 MED ORDER — TAMSULOSIN HCL 0.4 MG PO CAPS: 0.4000 mg | ORAL_CAPSULE | Freq: Every day | ORAL | 1 refills | Status: DC

## 2019-03-20 ENCOUNTER — Ambulatory Visit (INDEPENDENT_AMBULATORY_CARE_PROVIDER_SITE_OTHER): Payer: 59 | Admitting: Podiatry

## 2019-03-20 ENCOUNTER — Other Ambulatory Visit: Payer: Self-pay

## 2019-03-20 ENCOUNTER — Encounter: Payer: Self-pay | Admitting: Podiatry

## 2019-03-20 DIAGNOSIS — B079 Viral wart, unspecified: Secondary | ICD-10-CM | POA: Insufficient documentation

## 2019-03-20 DIAGNOSIS — L989 Disorder of the skin and subcutaneous tissue, unspecified: Secondary | ICD-10-CM

## 2019-03-20 DIAGNOSIS — B078 Other viral warts: Secondary | ICD-10-CM | POA: Diagnosis not present

## 2019-03-20 NOTE — Progress Notes (Signed)
This patient presents the office with chief complaint of a painful callus under her fourth toe left.  She says that she has had this skin lesion for approximately 4 months.  This skin lesion has become cone shaped  with a base of callus under her left fourth toe.  Patient states that she has tried to cut the callus herself as well as use corn pads.  She says this lesion continues to regrow.  She denies any foreign body penetration at the site.  She presents the office today for an evaluation and treatment of this condition.  Vascular  Dorsalis pedis and posterior tibial pulses are palpable  B/L.  Capillary return  WNL.  Temperature gradient is  WNL.  Skin turgor  WNL  Sensorium  Senn Weinstein monofilament wire  WNL. Normal tactile sensation.  Nail Exam  Patient has normal nails with no evidence of bacterial or fungal infection.  Orthopedic  Exam  Muscle tone and muscle strength  WNL.  No limitations of motion feet  B/L.  No crepitus or joint effusion noted.  Foot type is unremarkable and digits show no abnormalities.  Bony prominences are unremarkable.  Skin  No open lesions.  Normal skin texture and turgor.  Cone shaped callus at base fourth toe sulcus plantarly.  No redness or swelling noted.  No soft tissue or cyst noted.  Benign skin lesion.  Possible verrucae  IE. Debride callus.  Discussed this condition with this patient.  Told her that following my debridement of the lesion we should give her about 4 weeks.  At that time we will reevaluate her condition and possibly biopsy this lesion and send it to the lab for skin evaluation.   Gardiner Barefoot DPM

## 2019-04-12 ENCOUNTER — Other Ambulatory Visit: Payer: Self-pay | Admitting: Nurse Practitioner

## 2019-04-13 ENCOUNTER — Other Ambulatory Visit: Payer: Self-pay

## 2019-04-13 ENCOUNTER — Ambulatory Visit (INDEPENDENT_AMBULATORY_CARE_PROVIDER_SITE_OTHER): Payer: 59 | Admitting: Dermatology

## 2019-04-13 DIAGNOSIS — D1801 Hemangioma of skin and subcutaneous tissue: Secondary | ICD-10-CM

## 2019-04-13 DIAGNOSIS — L719 Rosacea, unspecified: Secondary | ICD-10-CM | POA: Diagnosis not present

## 2019-04-13 DIAGNOSIS — D229 Melanocytic nevi, unspecified: Secondary | ICD-10-CM

## 2019-04-13 DIAGNOSIS — L84 Corns and callosities: Secondary | ICD-10-CM | POA: Diagnosis not present

## 2019-04-13 DIAGNOSIS — L814 Other melanin hyperpigmentation: Secondary | ICD-10-CM

## 2019-04-13 DIAGNOSIS — L821 Other seborrheic keratosis: Secondary | ICD-10-CM

## 2019-04-13 DIAGNOSIS — L578 Other skin changes due to chronic exposure to nonionizing radiation: Secondary | ICD-10-CM

## 2019-04-13 DIAGNOSIS — Z1283 Encounter for screening for malignant neoplasm of skin: Secondary | ICD-10-CM | POA: Diagnosis not present

## 2019-04-13 LAB — COMPREHENSIVE METABOLIC PANEL
ALT: 15 IU/L (ref 0–32)
AST: 12 IU/L (ref 0–40)
Albumin/Globulin Ratio: 1.8 (ref 1.2–2.2)
Albumin: 4.2 g/dL (ref 3.8–4.8)
Alkaline Phosphatase: 99 IU/L (ref 39–117)
BUN/Creatinine Ratio: 15 (ref 12–28)
BUN: 11 mg/dL (ref 8–27)
Bilirubin Total: 0.3 mg/dL (ref 0.0–1.2)
CO2: 24 mmol/L (ref 20–29)
Calcium: 9.4 mg/dL (ref 8.7–10.3)
Chloride: 100 mmol/L (ref 96–106)
Creatinine, Ser: 0.71 mg/dL (ref 0.57–1.00)
GFR calc Af Amer: 104 mL/min/{1.73_m2} (ref >59)
GFR calc non Af Amer: 90 mL/min/{1.73_m2} (ref >59)
Globulin, Total: 2.3 g/dL (ref 1.5–4.5)
Glucose: 105 mg/dL — ABNORMAL HIGH (ref 65–99)
Potassium: 4.3 mmol/L (ref 3.5–5.2)
Sodium: 139 mmol/L (ref 134–144)
Total Protein: 6.5 g/dL (ref 6.0–8.5)

## 2019-04-13 LAB — T4, FREE: Free T4: 0.85 ng/dL (ref 0.82–1.77)

## 2019-04-13 LAB — CBC
Hematocrit: 43.1 % (ref 34.0–46.6)
Hemoglobin: 14.6 g/dL (ref 11.1–15.9)
MCH: 31.5 pg (ref 26.6–33.0)
MCHC: 33.9 g/dL (ref 31.5–35.7)
MCV: 93 fL (ref 79–97)
Platelets: 208 10*3/uL (ref 150–450)
RBC: 4.64 x10E6/uL (ref 3.77–5.28)
RDW: 12.1 % (ref 11.7–15.4)
WBC: 6.4 10*3/uL (ref 3.4–10.8)

## 2019-04-13 LAB — LIPID PANEL W/O CHOL/HDL RATIO
Cholesterol, Total: 239 mg/dL — ABNORMAL HIGH (ref 100–199)
HDL: 49 mg/dL (ref >39)
LDL Chol Calc (NIH): 165 mg/dL — ABNORMAL HIGH (ref 0–99)
Triglycerides: 139 mg/dL (ref 0–149)
VLDL Cholesterol Cal: 25 mg/dL (ref 5–40)

## 2019-04-13 LAB — HCV INTERPRETATION

## 2019-04-13 LAB — VITAMIN D 25 HYDROXY (VIT D DEFICIENCY, FRACTURES): Vit D, 25-Hydroxy: 28.9 ng/mL — ABNORMAL LOW (ref 30.0–100.0)

## 2019-04-13 LAB — TSH: TSH: 2.61 u[IU]/mL (ref 0.450–4.500)

## 2019-04-13 LAB — HCV AB W REFLEX TO QUANT PCR: HCV Ab: <0.1 s/co ratio (ref 0.0–0.9)

## 2019-04-13 NOTE — Progress Notes (Signed)
   Follow-Up Visit   Subjective  Amanda Mcgee is a 64 y.o. female who presents for the following: Annual Exam (Nothing new or changing.) and Other (Spot on left foot removed by podiatrist). She presents for total body skin exam and skin cancer screening. She has several spots she would like evaluated. She had significant history of past sun exposure.   The following portions of the chart were reviewed this encounter and updated as appropriate:     Review of Systems: No other skin or systemic complaints.  Objective  Well appearing patient in no apparent distress; mood and affect are within normal limits.  A full examination was performed including scalp, head, eyes, ears, nose, lips, neck, chest, axillae, abdomen, back, buttocks, bilateral upper extremities, bilateral lower extremities, hands, feet, fingers, toes, fingernails, and toenails. All findings within normal limits unless otherwise noted below.  Objective  Left 4th Plantar Toe: Yellow hyperkeratotic papule.  Objective  Face: Dilated blood vessels.  Assessment & Plan    Skin cancer screening performed today.  Actinic Damage - diffuse scaly erythematous macules with underlying dyspigmentation - Recommend daily broad spectrum sunscreen SPF 30+ to sun-exposed areas, reapply every 2 hours as needed.  - Call for new or changing lesions.  Hemangiomas - Red papules - Discussed benign nature - Observe - Call for any changes  Lentigines - Scattered tan macules - Discussed due to sun exposure - Benign, observe - Call for any changes  Melanocytic Nevi - Tan-brown and/or pink-flesh-colored symmetric macules and papules - Benign appearing on exam today - Observation - Call clinic for new or changing moles - Recommend daily use of broad spectrum spf 30+ sunscreen to sun-exposed areas.   Seborrheic Keratoses - Stuck-on, waxy, tan-brown papules and plaques  - Discussed benign etiology and prognosis. - Observe - Call  for any changes   Pre-ulcerative corn or callous Left 4th Plantar Toe  Observe   Rosacea Face  Rosacea vs actinic changes - Discussed BBL treatment.  Return in about 1 year (around 04/12/2020).   I, Ashok Cordia, CMA, am acting as scribe for Sarina Ser, MD .

## 2019-04-17 NOTE — Progress Notes (Signed)
Mildly elevated LDL ad total cholesterol. Other labs good. Still waiting on a few results. Discuss at next visit 06/27/2019

## 2019-04-20 ENCOUNTER — Ambulatory Visit: Payer: 59 | Admitting: Podiatry

## 2019-04-21 ENCOUNTER — Other Ambulatory Visit: Payer: Self-pay

## 2019-04-21 DIAGNOSIS — F331 Major depressive disorder, recurrent, moderate: Secondary | ICD-10-CM

## 2019-04-21 MED ORDER — DESVENLAFAXINE SUCCINATE ER 100 MG PO TB24: 100.0000 mg | ORAL_TABLET | Freq: Every day | ORAL | 1 refills | Status: DC

## 2019-05-16 ENCOUNTER — Ambulatory Visit: Payer: 59 | Admitting: Podiatry

## 2019-05-24 ENCOUNTER — Encounter: Payer: Self-pay | Admitting: Podiatry

## 2019-05-24 ENCOUNTER — Ambulatory Visit (INDEPENDENT_AMBULATORY_CARE_PROVIDER_SITE_OTHER): Payer: 59 | Admitting: Podiatry

## 2019-05-24 ENCOUNTER — Other Ambulatory Visit: Payer: Self-pay

## 2019-05-24 DIAGNOSIS — L989 Disorder of the skin and subcutaneous tissue, unspecified: Secondary | ICD-10-CM

## 2019-05-24 NOTE — Progress Notes (Signed)
She presents today for follow-up of a painful lesion plantar lateral aspect fourth toe left foot.  She states that the skin peeled off of the disseminated felt a lot better but is still little tender to touch.  Objective: Vital signs are stable she is alert oriented x3 I reviewed her past medical history medications allergies surgery social history at her last visit to our office.  Pulses remain palpable bilateral.  Adductovarus rotated hammertoe deformity fifth left is noted.  This is juxtaposition to the lateral aspect of the fourth toe left foot which has resulted in a reactive hyperkeratotic lesion.  I debrided this lesion today there is a small area that is nucleated I removed the nucleus on the foot felt much better and then placed padding.  Assessment: Between the fourth and fifth toes  Plan: Debrided the reactive hyperkeratotic lesion placed padding follow-up with me as needed.

## 2019-06-07 ENCOUNTER — Other Ambulatory Visit: Payer: Self-pay

## 2019-06-07 ENCOUNTER — Encounter: Payer: Self-pay | Admitting: Adult Health

## 2019-06-07 ENCOUNTER — Ambulatory Visit (INDEPENDENT_AMBULATORY_CARE_PROVIDER_SITE_OTHER): Payer: 59 | Admitting: Adult Health

## 2019-06-07 VITALS — BP 148/77 | HR 87 | Temp 97.4°F | Resp 16 | Ht 65.0 in | Wt 163.2 lb

## 2019-06-07 DIAGNOSIS — I10 Essential (primary) hypertension: Secondary | ICD-10-CM | POA: Diagnosis not present

## 2019-06-07 DIAGNOSIS — H6123 Impacted cerumen, bilateral: Secondary | ICD-10-CM

## 2019-06-07 DIAGNOSIS — R42 Dizziness and giddiness: Secondary | ICD-10-CM | POA: Diagnosis not present

## 2019-06-07 DIAGNOSIS — H9203 Otalgia, bilateral: Secondary | ICD-10-CM | POA: Diagnosis not present

## 2019-06-07 DIAGNOSIS — F411 Generalized anxiety disorder: Secondary | ICD-10-CM

## 2019-06-07 MED ORDER — MECLIZINE HCL 25 MG PO TABS: ORAL_TABLET | ORAL | 1 refills | Status: DC

## 2019-06-07 MED ORDER — CARBAMIDE PEROXIDE 6.5 % OT SOLN: 5.0000 [drp] | Freq: Two times a day (BID) | OTIC | 0 refills | Status: DC

## 2019-06-07 NOTE — Progress Notes (Signed)
[facility] [place], [place] [address]  Internal MEDICINE  Office Visit Note  Patient Name: Amanda Mcgee  [id number]  [id number]  Date of Service: 06/07/2019  Chief Complaint  Patient presents with  . Acute Visit    ears itchy and painful, naseous, sore neck      HPI Pt is here for a sick visit. Patient reports a few weeks of ear soreness and itching.  She didn't think much of it, until a few days ago she started having dizziness and vertigo.  She is out of her meclizine currently.  It does help when she takes it.      Current Medication:  Outpatient Encounter Medications as of 06/07/2019  Medication Sig  . albuterol (VENTOLIN HFA) 108 (90 Base) MCG/ACT inhaler Inhale 2 puffs into the lungs every 6 (six) hours as needed for wheezing or shortness of breath.  . ALPRAZolam (XANAX) 0.5 MG tablet Take 1 tablet po QAM and 1 to 2 tablets po QHS  . buPROPion (WELLBUTRIN SR) 200 MG 12 hr tablet Take 1 tablet (200 mg total) by mouth 2 (two) times daily.  . calcium carbonate (CALCIUM 600) 600 MG TABS tablet Take 600 mg by mouth 2 (two) times daily with a meal.  . Cholecalciferol (VITAMIN D3) 400 units CAPS Take by mouth daily.  Marland Kitchen CRANBERRY PO Take 1 tablet by mouth 2 (two) times daily.  . cyclobenzaprine (FLEXERIL) 10 MG tablet Take 10 mg by mouth 2 (two) times daily as needed for muscle spasms.  Marland Kitchen desvenlafaxine (PRISTIQ) 100 MG 24 hr tablet Take 1 tablet (100 mg total) by mouth daily.  Marland Kitchen docusate sodium (COLACE) 100 MG capsule Take 100 mg by mouth 2 (two) times daily as needed for mild constipation.  . fluticasone (FLONASE) 50 MCG/ACT nasal spray Place 1 spray into both nostrils daily.  Marland Kitchen levocetirizine (XYZAL) 5 MG tablet Take 1 tablet (5 mg total) by mouth daily.  Marland Kitchen lisinopril (ZESTRIL) 10 MG tablet Take 1 tablet (10 mg total) by mouth daily.  Marland Kitchen lubiprostone (AMITIZA) 24 MCG capsule Take 1 capsule (24 mcg total) by mouth 2 (two) times daily with a meal.  .  meclizine (ANTIVERT) 25 MG tablet Take twice daily as needed for dizziness  . tamsulosin (FLOMAX) 0.4 MG CAPS capsule Take 1 capsule (0.4 mg total) by mouth daily after breakfast.  . [DISCONTINUED] meclizine (ANTIVERT) 25 MG tablet Take twice daily as needed for dizziness  . carbamide peroxide (DEBROX) 6.5 % OTIC solution Place 5 drops into both ears 2 (two) times daily.   No facility-administered encounter medications on file as of 06/07/2019.      Medical History: Past Medical History:  Diagnosis Date  . Anxiety   . Arthritis   . Depression   . HTN (hypertension)   . Hyperlipidemia   . Kidney stones 2013     Vital Signs: BP (!) 148/77   Pulse 87   Temp (!) 97.4 F (36.3 C)   Resp 16   Ht 5\' 5"  (1.651 m)   Wt 163 lb 3.2 oz (74 kg)   SpO2 94%   BMI 27.16 kg/m    Review of Systems  Constitutional: Negative for chills, fatigue and unexpected weight change.  HENT: Negative for congestion, rhinorrhea, sneezing and sore throat.   Eyes: Negative for photophobia, pain and redness.  Respiratory: Negative for cough, chest tightness and shortness of breath.   Cardiovascular: Negative for chest pain and palpitations.  Gastrointestinal: Negative for  abdominal pain, constipation, diarrhea, nausea and vomiting.  Endocrine: Negative.   Genitourinary: Negative for dysuria and frequency.  Musculoskeletal: Negative for arthralgias, back pain, joint swelling and neck pain.  Skin: Negative for rash.  Allergic/Immunologic: Negative.   Neurological: Positive for dizziness. Negative for tremors and numbness.  Hematological: Negative for adenopathy. Does not bruise/bleed easily.  Psychiatric/Behavioral: Negative for behavioral problems and sleep disturbance. The patient is not nervous/anxious.     Physical Exam Vitals and nursing note reviewed.  Constitutional:      General: She is not in acute distress.    Appearance: She is well-developed. She is not diaphoretic.  HENT:     Head:  Normocephalic and atraumatic.     Mouth/Throat:     Pharynx: No oropharyngeal exudate.  Eyes:     Pupils: Pupils are equal, round, and reactive to light.  Neck:     Thyroid: No thyromegaly.     Vascular: No JVD.     Trachea: No tracheal deviation.  Cardiovascular:     Rate and Rhythm: Normal rate and regular rhythm.     Heart sounds: Normal heart sounds. No murmur. No friction rub. No gallop.   Pulmonary:     Effort: Pulmonary effort is normal. No respiratory distress.     Breath sounds: Normal breath sounds. No wheezing or rales.  Chest:     Chest wall: No tenderness.  Abdominal:     Palpations: Abdomen is soft.     Tenderness: There is no abdominal tenderness. There is no guarding.  Musculoskeletal:        General: Normal range of motion.     Cervical back: Normal range of motion and neck supple.  Lymphadenopathy:     Cervical: No cervical adenopathy.  Skin:    General: Skin is warm and dry.  Neurological:     Mental Status: She is alert and oriented to person, place, and time.     Cranial Nerves: No cranial nerve deficit.  Psychiatric:        Behavior: Behavior normal.        Thought Content: Thought content normal.        Judgment: Judgment normal.    Assessment/Plan: 1. Bilateral hearing loss due to cerumen impaction Use debrox drops as discussed.  - carbamide peroxide (DEBROX) 6.5 % OTIC solution; Place 5 drops into both ears 2 (two) times daily.  Dispense: 15 mL; Refill: 0  2. Vertigo Impacted cerumen noted.  Refilled Anitvert as directed.  - meclizine (ANTIVERT) 25 MG tablet; Take twice daily as needed for dizziness  Dispense: 30 tablet; Refill: 1  3. Essential hypertension Slightly elevated today, pt has some white coat htn.   General Counseling: Amanda Mcgee understanding of the findings of todays visit and agrees with plan of treatment. I have discussed any further diagnostic evaluation that may be needed or ordered today. We also reviewed her  medications today. she has been encouraged to call the office with any questions or concerns that should arise related to todays visit.   No orders of the defined types were placed in this encounter.   Meds ordered this encounter  Medications  . meclizine (ANTIVERT) 25 MG tablet    Sig: Take twice daily as needed for dizziness    Dispense:  30 tablet    Refill:  1  . carbamide peroxide (DEBROX) 6.5 % OTIC solution    Sig: Place 5 drops into both ears 2 (two) times daily.    Dispense:  15  mL    Refill:  0    Time spent: 30 Minutes  This patient was seen by Orson Gear AGNP-C in Collaboration with Dr Lavera Guise as a part of collaborative care agreement.  Kendell Bane AGNP-C Internal Medicine

## 2019-06-09 MED ORDER — ALPRAZOLAM 0.5 MG PO TABS: ORAL_TABLET | ORAL | 3 refills | Status: DC

## 2019-06-09 NOTE — Telephone Encounter (Signed)
Pt notified we send pres

## 2019-06-13 ENCOUNTER — Telehealth: Payer: Self-pay | Admitting: Nurse Practitioner

## 2019-06-13 ENCOUNTER — Other Ambulatory Visit: Payer: Self-pay | Admitting: Nurse Practitioner

## 2019-06-13 DIAGNOSIS — H66003 Acute suppurative otitis media without spontaneous rupture of ear drum, bilateral: Secondary | ICD-10-CM

## 2019-06-13 MED ORDER — SULFAMETHOXAZOLE-TRIMETHOPRIM 400-80 MG PO TABS: 1.0000 | ORAL_TABLET | Freq: Two times a day (BID) | ORAL | 0 refills | Status: DC

## 2019-06-13 NOTE — Progress Notes (Signed)
Sent regular strength bactrim which is twice daily for 10 days. Sent in for [facility] on [address].

## 2019-06-13 NOTE — Telephone Encounter (Signed)
Sent regular strength bactrim which is twice daily for 10 days. Sent in for [facility] on [address].  She should go ahead and begin using ear drops as well.

## 2019-06-23 ENCOUNTER — Telehealth: Payer: Self-pay

## 2019-06-23 NOTE — Telephone Encounter (Signed)
Confirmed and screened for 06-27-19 ov.

## 2019-06-27 ENCOUNTER — Other Ambulatory Visit: Payer: Self-pay

## 2019-06-27 ENCOUNTER — Ambulatory Visit (INDEPENDENT_AMBULATORY_CARE_PROVIDER_SITE_OTHER): Payer: 59 | Admitting: Nurse Practitioner

## 2019-06-27 ENCOUNTER — Encounter: Payer: Self-pay | Admitting: Nurse Practitioner

## 2019-06-27 VITALS — BP 150/84 | HR 85 | Temp 97.6°F | Resp 16 | Ht 65.0 in | Wt 162.4 lb

## 2019-06-27 DIAGNOSIS — R300 Dysuria: Secondary | ICD-10-CM | POA: Diagnosis not present

## 2019-06-27 DIAGNOSIS — K581 Irritable bowel syndrome with constipation: Secondary | ICD-10-CM

## 2019-06-27 DIAGNOSIS — I10 Essential (primary) hypertension: Secondary | ICD-10-CM | POA: Diagnosis not present

## 2019-06-27 DIAGNOSIS — R110 Nausea: Secondary | ICD-10-CM | POA: Diagnosis not present

## 2019-06-27 DIAGNOSIS — F411 Generalized anxiety disorder: Secondary | ICD-10-CM

## 2019-06-27 DIAGNOSIS — N390 Urinary tract infection, site not specified: Secondary | ICD-10-CM

## 2019-06-27 MED ORDER — BUPROPION HCL ER (SR) 200 MG PO TB12: 200.0000 mg | ORAL_TABLET | Freq: Two times a day (BID) | ORAL | 3 refills | Status: DC

## 2019-06-27 MED ORDER — ONDANSETRON HCL 4 MG PO TABS: 4.0000 mg | ORAL_TABLET | Freq: Three times a day (TID) | ORAL | 0 refills | Status: DC | PRN

## 2019-06-27 MED ORDER — DOXYCYCLINE HYCLATE 100 MG PO TABS: 100.0000 mg | ORAL_TABLET | Freq: Two times a day (BID) | ORAL | 0 refills | Status: DC

## 2019-06-27 NOTE — Progress Notes (Signed)
[facility] [place], [place] [address]  Internal MEDICINE  Office Visit Note  Patient Name: Amanda Mcgee  [id number]  [id number]  Date of Service: 07/08/2019  Chief Complaint  Patient presents with  . Follow-up  . Hypertension  . Hyperlipidemia  . Depression  . Urinary Tract Infection  . Back Pain    lower    The patient is here for routine follow up. She has been having flank pain along with urinary frequency and urgency. These symptoms have been going on for several days. Has been taking over the counter AZO. This does help the symptoms. Has several antibiotic allergies. Recently finished Bactrim DS due to sinusitis and vertigo. Her last tablet was on 06/22/2019.  She is managing her anxiety and depression well with current medication. Her blood pressure is well managed.       Current Medication: Outpatient Encounter Medications as of 06/27/2019  Medication Sig  . albuterol (VENTOLIN HFA) 108 (90 Base) MCG/ACT inhaler Inhale 2 puffs into the lungs every 6 (six) hours as needed for wheezing or shortness of breath.  . ALPRAZolam (XANAX) 0.5 MG tablet Take 1 tablet po QAM and 1 to 2 tablets po QHS  . buPROPion (WELLBUTRIN SR) 200 MG 12 hr tablet Take 1 tablet (200 mg total) by mouth 2 (two) times daily.  . calcium carbonate (CALCIUM 600) 600 MG TABS tablet Take 600 mg by mouth 2 (two) times daily with a meal.  . carbamide peroxide (DEBROX) 6.5 % OTIC solution Place 5 drops into both ears 2 (two) times daily.  . Cholecalciferol (VITAMIN D3) 400 units CAPS Take by mouth daily.  Marland Kitchen CRANBERRY PO Take 1 tablet by mouth 2 (two) times daily.  . cyclobenzaprine (FLEXERIL) 10 MG tablet Take 10 mg by mouth 2 (two) times daily as needed for muscle spasms.  Marland Kitchen desvenlafaxine (PRISTIQ) 100 MG 24 hr tablet Take 1 tablet (100 mg total) by mouth daily.  Marland Kitchen docusate sodium (COLACE) 100 MG capsule Take 100 mg by mouth 2 (two) times daily as needed for mild constipation.  .  fluticasone (FLONASE) 50 MCG/ACT nasal spray Place 1 spray into both nostrils daily.  Marland Kitchen levocetirizine (XYZAL) 5 MG tablet Take 1 tablet (5 mg total) by mouth daily.  Marland Kitchen lisinopril (ZESTRIL) 10 MG tablet Take 1 tablet (10 mg total) by mouth daily.  Marland Kitchen lubiprostone (AMITIZA) 24 MCG capsule Take 1 capsule (24 mcg total) by mouth 2 (two) times daily with a meal.  . meclizine (ANTIVERT) 25 MG tablet Take twice daily as needed for dizziness  . sulfamethoxazole-trimethoprim (BACTRIM) 400-80 MG tablet Take 1 tablet by mouth 2 (two) times daily.  . tamsulosin (FLOMAX) 0.4 MG CAPS capsule Take 1 capsule (0.4 mg total) by mouth daily after breakfast.  . [DISCONTINUED] buPROPion (WELLBUTRIN SR) 200 MG 12 hr tablet Take 1 tablet (200 mg total) by mouth 2 (two) times daily.  Marland Kitchen doxycycline (VIBRA-TABS) 100 MG tablet Take 1 tablet (100 mg total) by mouth 2 (two) times daily.  . ondansetron (ZOFRAN) 4 MG tablet Take 1 tablet (4 mg total) by mouth every 8 (eight) hours as needed for nausea or vomiting.   No facility-administered encounter medications on file as of 06/27/2019.    Surgical History: Past Surgical History:  Procedure Laterality Date  . ABDOMINAL HYSTERECTOMY  1990  . APPENDECTOMY  1969  . BLADDER SURGERY  2011  . BREAST BIOPSY Left    benign  . COLONOSCOPY WITH PROPOFOL N/A 02/01/2018  Procedure: COLONOSCOPY WITH PROPOFOL;  Surgeon: [name], MD;  Location: [facility] ENDOSCOPY;  Service: Gastroenterology;  Laterality: N/A;  . LITHOTRIPSY  06/2011    Medical History: Past Medical History:  Diagnosis Date  . Anxiety   . Arthritis   . Depression   . HTN (hypertension)   . Hyperlipidemia   . Kidney stones 2013    Family History: Family History  Problem Relation Age of Onset  . Breast cancer Mother 22    Social History   Socioeconomic History  . Marital status: Married    Spouse name: Not on file  . Number of children: Not on file  . Years of education: Not on file  . Highest  education level: Not on file  Occupational History  . Not on file  Tobacco Use  . Smoking status: Current Every Day Smoker    Packs/day: 0.50    Years: 30.00    Pack years: 15.00    Types: Cigarettes  . Smokeless tobacco: Never Used  Vaping Use  . Vaping Use: Never used  Substance and Sexual Activity  . Alcohol use: Yes    Comment: occassionally  . Drug use: No  . Sexual activity: Never  Other Topics Concern  . Not on file  Social History Narrative  . Not on file   Social Determinants of Health   Financial Resource Strain:   . Difficulty of Paying Living Expenses:   Food Insecurity:   . Worried About Charity fundraiser in the Last Year:   . Arboriculturist in the Last Year:   Transportation Needs:   . Film/video editor (Medical):   Marland Kitchen Lack of Transportation (Non-Medical):   Physical Activity:   . Days of Exercise per Week:   . Minutes of Exercise per Session:   Stress:   . Feeling of Stress :   Social Connections:   . Frequency of Communication with Friends and Family:   . Frequency of Social Gatherings with Friends and Family:   . Attends Religious Services:   . Active Member of Clubs or Organizations:   . Attends Archivist Meetings:   Marland Kitchen Marital Status:   Intimate Partner Violence:   . Fear of Current or Ex-Partner:   . Emotionally Abused:   Marland Kitchen Physically Abused:   . Sexually Abused:       Review of Systems  Constitutional: Negative for activity change, chills, fatigue and unexpected weight change.  HENT: Negative for congestion, postnasal drip, rhinorrhea, sneezing and sore throat.        Recently treated for otitis externa and sinusitis. Feels as though symptoms have resolved.   Respiratory: Negative for cough, chest tightness, shortness of breath and wheezing.   Cardiovascular: Negative for chest pain and palpitations.  Gastrointestinal: Negative for abdominal pain, constipation, diarrhea, nausea and vomiting.  Endocrine: Negative for  cold intolerance, heat intolerance, polydipsia and polyuria.  Genitourinary: Positive for dysuria, flank pain, frequency, pelvic pain and urgency.  Musculoskeletal: Negative for arthralgias, back pain, joint swelling and neck pain.  Skin: Negative for rash.  Allergic/Immunologic: Negative for environmental allergies.  Neurological: Negative for dizziness, tremors, numbness and headaches.  Hematological: Negative for adenopathy. Does not bruise/bleed easily.  Psychiatric/Behavioral: Positive for dysphoric mood. Negative for behavioral problems (Depression), sleep disturbance and suicidal ideas. The patient is nervous/anxious.     Today's Vitals   06/27/19 0909  BP: (!) 150/84  Pulse: 85  Resp: 16  Temp: 97.6 F (36.4 C)  SpO2:  95%  Weight: 162 lb 6.4 oz (73.7 kg)  Height: 5\' 5"  (1.651 m)   Body mass index is 27.02 kg/m.  Physical Exam Vitals and nursing note reviewed.  Constitutional:      General: She is not in acute distress.    Appearance: Normal appearance. She is well-developed. She is not diaphoretic.  HENT:     Head: Normocephalic and atraumatic.     Mouth/Throat:     Pharynx: No oropharyngeal exudate.  Eyes:     Pupils: Pupils are equal, round, and reactive to light.  Neck:     Thyroid: No thyromegaly.     Vascular: No carotid bruit or JVD.     Trachea: No tracheal deviation.  Cardiovascular:     Rate and Rhythm: Normal rate and regular rhythm.     Heart sounds: Normal heart sounds. No murmur heard.  No friction rub. No gallop.   Pulmonary:     Effort: Pulmonary effort is normal. No respiratory distress.     Breath sounds: Normal breath sounds. No wheezing or rales.  Chest:     Chest wall: No tenderness.  Abdominal:     Palpations: Abdomen is soft.  Musculoskeletal:        General: Normal range of motion.     Cervical back: Normal range of motion and neck supple.  Lymphadenopathy:     Cervical: No cervical adenopathy.  Skin:    General: Skin is warm and  dry.  Neurological:     Mental Status: She is alert and oriented to person, place, and time.     Cranial Nerves: No cranial nerve deficit.  Psychiatric:        Mood and Affect: Mood normal.        Behavior: Behavior normal.        Thought Content: Thought content normal.        Judgment: Judgment normal.    Assessment/Plan:  1. Essential hypertension Stable. Continue bp medication as prescribed   2. Urinary tract infection without hematuria, site unspecified Send urine to lab for culture and sensitivity as patient has taken AZO and cannot be analyzed in the office. Start edoxycycline 100mg  twice daily for next 10 days. Adjust antibiotic treatment based on culture and sensitivity result.s  Start doxycycline 100- CULTURE, URINE COMPREHENSIVE - doxycycline (VIBRA-TABS) 100 MG tablet; Take 1 tablet (100 mg total) by mouth 2 (two) times daily.  Dispense: 28 tablet; Refill: 0  3. Dysuria conitnue to use OTC AZO to relieve bladder pain and spasms.  - UA/M w/rflx Culture, Routine  4. Nausea May take zofran 4mg  up to three times daily as needed for nausea.   5. Irritable bowel syndrome with constipation conitnue amitiza as prescribed.   6. Generalized anxiety disorder Well managed. Continue wellbutrin as prescribed. May take alprazolam as needed and as prescribed  - buPROPion (WELLBUTRIN SR) 200 MG 12 hr tablet; Take 1 tablet (200 mg total) by mouth 2 (two) times daily.  Dispense: 180 tablet; Refill: 3   General Counseling: Xoe verbalizes understanding of the findings of todays visit and agrees with plan of treatment. I have discussed any further diagnostic evaluation that may be needed or ordered today. We also reviewed her medications today. she has been encouraged to call the office with any questions or concerns that should arise related to todays visit.  This patient was seen by Leretha Pol FNP Collaboration with Dr Lavera Guise as a part of collaborative care  agreement  Orders Placed  This Encounter  Procedures  . CULTURE, URINE COMPREHENSIVE  . Microscopic Examination  . Urine Culture, Reflex  . UA/M w/rflx Culture, Routine    Meds ordered this encounter  Medications  . doxycycline (VIBRA-TABS) 100 MG tablet    Sig: Take 1 tablet (100 mg total) by mouth 2 (two) times daily.    Dispense:  28 tablet    Refill:  0    Order Specific Question:   Supervising Provider    Answer:   Lavera Guise [[id number]]  . ondansetron (ZOFRAN) 4 MG tablet    Sig: Take 1 tablet (4 mg total) by mouth every 8 (eight) hours as needed for nausea or vomiting.    Dispense:  30 tablet    Refill:  0    Order Specific Question:   Supervising Provider    Answer:   Lavera Guise [[id number]]  . buPROPion (WELLBUTRIN SR) 200 MG 12 hr tablet    Sig: Take 1 tablet (200 mg total) by mouth 2 (two) times daily.    Dispense:  180 tablet    Refill:  3    Order Specific Question:   Supervising Provider    Answer:   Lavera Guise [[id number]]    Total time spent: 30 Minutes   Time spent includes review of chart, medications, test results, and follow up plan with the patient.      Dr Lavera Guise Internal medicine

## 2019-06-30 LAB — UA/M W/RFLX CULTURE, ROUTINE
Bilirubin, UA: NEGATIVE
Glucose, UA: NEGATIVE
Ketones, UA: NEGATIVE
Nitrite, UA: POSITIVE — AB
Specific Gravity, UA: 1.019 (ref 1.005–1.030)
Urobilinogen, Ur: 1 mg/dL (ref 0.2–1.0)
pH, UA: 5.5 (ref 5.0–7.5)

## 2019-06-30 LAB — URINE CULTURE, REFLEX

## 2019-06-30 LAB — MICROSCOPIC EXAMINATION
Bacteria, UA: NONE SEEN
Casts: NONE SEEN /lpf
RBC, Urine: >30 /hpf — AB (ref 0–2)
WBC, UA: >30 /hpf — AB (ref 0–5)

## 2019-07-02 LAB — CULTURE, URINE COMPREHENSIVE

## 2019-07-08 DIAGNOSIS — N390 Urinary tract infection, site not specified: Secondary | ICD-10-CM | POA: Insufficient documentation

## 2019-07-10 ENCOUNTER — Other Ambulatory Visit: Payer: Self-pay | Admitting: Nurse Practitioner

## 2019-07-10 ENCOUNTER — Telehealth: Payer: Self-pay

## 2019-07-10 DIAGNOSIS — N390 Urinary tract infection, site not specified: Secondary | ICD-10-CM

## 2019-07-10 DIAGNOSIS — B3731 Acute candidiasis of vulva and vagina: Secondary | ICD-10-CM

## 2019-07-10 MED ORDER — FLUCONAZOLE 150 MG PO TABS: ORAL_TABLET | ORAL | 0 refills | Status: DC

## 2019-07-10 MED ORDER — NITROFURANTOIN MONOHYD MACRO 100 MG PO CAPS: 100.0000 mg | ORAL_CAPSULE | Freq: Two times a day (BID) | ORAL | 0 refills | Status: DC

## 2019-07-10 NOTE — Progress Notes (Signed)
Changed antibiotic to macrobid 100 mg bid for 10 days.

## 2019-07-10 NOTE — Telephone Encounter (Signed)
Pt notified that we change antibiotic to macrobid for 10 days and send diflucan

## 2019-07-10 NOTE — Telephone Encounter (Signed)
Changed antibiotics to macrobid 100mg  bid for 101 days. Added diflucan in case yeast infection develops. Take as needed and as prescribed.

## 2019-07-10 NOTE — Progress Notes (Signed)
Changed antibiotics to macrobid 100mg  bid for 101 days. Added diflucan in case yeast infection develops. Take as needed and as prescribed.

## 2019-07-12 ENCOUNTER — Telehealth: Payer: Self-pay

## 2019-07-12 NOTE — Telephone Encounter (Signed)
Pt called that macrobid make her nausea and vomiting as heather advised her to stopped and drink plenty of water and if  She had symptoms of uti we like her to see back in office

## 2019-07-31 ENCOUNTER — Other Ambulatory Visit: Payer: Self-pay

## 2019-07-31 DIAGNOSIS — J309 Allergic rhinitis, unspecified: Secondary | ICD-10-CM

## 2019-07-31 MED ORDER — LEVOCETIRIZINE DIHYDROCHLORIDE 5 MG PO TABS: 5.0000 mg | ORAL_TABLET | Freq: Every day | ORAL | 1 refills | Status: DC

## 2019-09-26 ENCOUNTER — Other Ambulatory Visit: Payer: Self-pay

## 2019-09-26 DIAGNOSIS — F411 Generalized anxiety disorder: Secondary | ICD-10-CM

## 2019-09-26 MED ORDER — ALPRAZOLAM 0.5 MG PO TABS: ORAL_TABLET | ORAL | 1 refills | Status: DC

## 2019-10-02 ENCOUNTER — Other Ambulatory Visit: Payer: Self-pay

## 2019-10-02 DIAGNOSIS — I10 Essential (primary) hypertension: Secondary | ICD-10-CM

## 2019-10-02 MED ORDER — LISINOPRIL 10 MG PO TABS: 10.0000 mg | ORAL_TABLET | Freq: Every day | ORAL | 3 refills | Status: DC

## 2019-10-16 ENCOUNTER — Other Ambulatory Visit: Payer: Self-pay

## 2019-10-16 DIAGNOSIS — F331 Major depressive disorder, recurrent, moderate: Secondary | ICD-10-CM

## 2019-10-16 MED ORDER — DESVENLAFAXINE SUCCINATE ER 100 MG PO TB24: 100.0000 mg | ORAL_TABLET | Freq: Every day | ORAL | 1 refills | Status: DC

## 2019-10-26 ENCOUNTER — Encounter: Payer: Self-pay | Admitting: Nurse Practitioner

## 2019-10-26 ENCOUNTER — Other Ambulatory Visit: Payer: Self-pay

## 2019-10-26 ENCOUNTER — Ambulatory Visit (INDEPENDENT_AMBULATORY_CARE_PROVIDER_SITE_OTHER): Payer: Medicare Other | Admitting: Nurse Practitioner

## 2019-10-26 VITALS — BP 146/90 | HR 76 | Temp 97.5°F | Resp 16 | Ht 65.0 in | Wt 157.2 lb

## 2019-10-26 DIAGNOSIS — Z0001 Encounter for general adult medical examination with abnormal findings: Secondary | ICD-10-CM

## 2019-10-26 DIAGNOSIS — J4520 Mild intermittent asthma, uncomplicated: Secondary | ICD-10-CM

## 2019-10-26 DIAGNOSIS — I10 Essential (primary) hypertension: Secondary | ICD-10-CM | POA: Diagnosis not present

## 2019-10-26 DIAGNOSIS — F331 Major depressive disorder, recurrent, moderate: Secondary | ICD-10-CM | POA: Diagnosis not present

## 2019-10-26 DIAGNOSIS — Z23 Encounter for immunization: Secondary | ICD-10-CM

## 2019-10-26 DIAGNOSIS — Z13820 Encounter for screening for osteoporosis: Secondary | ICD-10-CM

## 2019-10-26 DIAGNOSIS — Z1231 Encounter for screening mammogram for malignant neoplasm of breast: Secondary | ICD-10-CM

## 2019-10-26 DIAGNOSIS — B372 Candidiasis of skin and nail: Secondary | ICD-10-CM | POA: Diagnosis not present

## 2019-10-26 DIAGNOSIS — M8008XA Age-related osteoporosis with current pathological fracture, vertebra(e), initial encounter for fracture: Secondary | ICD-10-CM

## 2019-10-26 DIAGNOSIS — F411 Generalized anxiety disorder: Secondary | ICD-10-CM

## 2019-10-26 DIAGNOSIS — Z79899 Other long term (current) drug therapy: Secondary | ICD-10-CM

## 2019-10-26 DIAGNOSIS — R300 Dysuria: Secondary | ICD-10-CM

## 2019-10-26 LAB — POCT URINE DRUG SCREEN
Methylenedioxyamphetamine: NOT DETECTED
POC Amphetamine UR: NOT DETECTED
POC BENZODIAZEPINES UR: POSITIVE — AB
POC Barbiturate UR: NOT DETECTED
POC Cocaine UR: NOT DETECTED
POC Ecstasy UR: NOT DETECTED
POC Marijuana UR: NOT DETECTED
POC Methadone UR: NOT DETECTED
POC Methamphetamine UR: NOT DETECTED
POC Opiate Ur: NOT DETECTED
POC Oxycodone UR: NOT DETECTED
POC PHENCYCLIDINE UR: NOT DETECTED
POC TRICYCLICS UR: NOT DETECTED

## 2019-10-26 MED ORDER — PNEUMOCOCCAL 13-VAL CONJ VACC IM SUSP: 0.5000 mL | Freq: Once | INTRAMUSCULAR | 0 refills | Status: AC

## 2019-10-26 MED ORDER — CLOTRIMAZOLE-BETAMETHASONE 1-0.05 % EX CREA: 1.0000 "application " | TOPICAL_CREAM | Freq: Two times a day (BID) | CUTANEOUS | 1 refills | Status: DC

## 2019-10-26 MED ORDER — ALBUTEROL SULFATE HFA 108 (90 BASE) MCG/ACT IN AERS: 2.0000 | INHALATION_SPRAY | Freq: Four times a day (QID) | RESPIRATORY_TRACT | 1 refills | Status: DC | PRN

## 2019-10-26 NOTE — Progress Notes (Signed)
[facility] [place], [place] [address]  Internal MEDICINE  Office Visit Note  Patient Name: Amanda Mcgee  [id number]  [id number]  Date of Service: 11/15/2019   Pt is here for routine health maintenance examination   Chief Complaint  Patient presents with  . Medicare Wellness  . Hyperlipidemia  . Hypertension  . Depression  . Quality Metric Gaps    bobe density     The patient is here for health maintenance exam. Blood pressure slightly elevated during visit, however, it is generally well controlled with current medication. She is due to have screening mammogram and bone density testing. She should also have prevnar 13 vaccine. The patient states that she is doing and feeling well. She has no concerns or complaints today.    Current Medication: Outpatient Encounter Medications as of 10/26/2019  Medication Sig  . albuterol (VENTOLIN HFA) 108 (90 Base) MCG/ACT inhaler Inhale 2 puffs into the lungs every 6 (six) hours as needed for wheezing or shortness of breath.  . ALPRAZolam (XANAX) 0.5 MG tablet Take 1 tablet po QAM and 1 to 2 tablets po QHS  . buPROPion (WELLBUTRIN SR) 200 MG 12 hr tablet Take 1 tablet (200 mg total) by mouth 2 (two) times daily.  . calcium carbonate (CALCIUM 600) 600 MG TABS tablet Take 600 mg by mouth 2 (two) times daily with a meal.  . Cholecalciferol (VITAMIN D3) 400 units CAPS Take by mouth daily.  Marland Kitchen CRANBERRY PO Take 1 tablet by mouth 2 (two) times daily.  Marland Kitchen desvenlafaxine (PRISTIQ) 100 MG 24 hr tablet Take 1 tablet (100 mg total) by mouth daily.  Marland Kitchen docusate sodium (COLACE) 100 MG capsule Take 100 mg by mouth 2 (two) times daily.  Marland Kitchen levocetirizine (XYZAL) 5 MG tablet Take 1 tablet (5 mg total) by mouth daily.  Marland Kitchen lisinopril (ZESTRIL) 10 MG tablet Take 1 tablet (10 mg total) by mouth daily.  . meclizine (ANTIVERT) 25 MG tablet Take twice daily as needed for dizziness  . [DISCONTINUED] albuterol (VENTOLIN HFA) 108 (90 Base)  MCG/ACT inhaler Inhale 2 puffs into the lungs every 6 (six) hours as needed for wheezing or shortness of breath.  . [DISCONTINUED] carbamide peroxide (DEBROX) 6.5 % OTIC solution Place 5 drops into both ears 2 (two) times daily.  . [DISCONTINUED] docusate sodium (COLACE) 100 MG capsule Take 100 mg by mouth 2 (two) times daily as needed for mild constipation.  . [DISCONTINUED] fluticasone (FLONASE) 50 MCG/ACT nasal spray Place 1 spray into both nostrils daily.  . [DISCONTINUED] nitrofurantoin, macrocrystal-monohydrate, (MACROBID) 100 MG capsule Take 1 capsule (100 mg total) by mouth 2 (two) times daily.  . clotrimazole-betamethasone (LOTRISONE) cream Apply 1 application topically 2 (two) times daily.  . [EXPIRED] pneumococcal 13-valent conjugate vaccine (PREVNAR 13) SUSP injection Inject 0.5 mLs into the muscle once for 1 dose.  . [DISCONTINUED] cyclobenzaprine (FLEXERIL) 10 MG tablet Take 10 mg by mouth 2 (two) times daily as needed for muscle spasms. (Patient not taking: Reported on 10/26/2019)  . [DISCONTINUED] doxycycline (VIBRA-TABS) 100 MG tablet Take 1 tablet (100 mg total) by mouth 2 (two) times daily. (Patient not taking: Reported on 10/26/2019)  . [DISCONTINUED] fluconazole (DIFLUCAN) 150 MG tablet Take 1 tablet po once. May repeat dose in 3 days as needed for persistent symptoms. (Patient not taking: Reported on 10/26/2019)  . [DISCONTINUED] lubiprostone (AMITIZA) 24 MCG capsule Take 1 capsule (24 mcg total) by mouth 2 (two) times daily with a meal. (Patient not taking:  Reported on 10/26/2019)  . [DISCONTINUED] ondansetron (ZOFRAN) 4 MG tablet Take 1 tablet (4 mg total) by mouth every 8 (eight) hours as needed for nausea or vomiting. (Patient not taking: Reported on 10/26/2019)  . [DISCONTINUED] sulfamethoxazole-trimethoprim (BACTRIM) 400-80 MG tablet Take 1 tablet by mouth 2 (two) times daily. (Patient not taking: Reported on 10/26/2019)  . [DISCONTINUED] tamsulosin (FLOMAX) 0.4 MG CAPS  capsule Take 1 capsule (0.4 mg total) by mouth daily after breakfast. (Patient not taking: Reported on 10/26/2019)   No facility-administered encounter medications on file as of 10/26/2019.    Surgical History: Past Surgical History:  Procedure Laterality Date  . ABDOMINAL HYSTERECTOMY  1990  . APPENDECTOMY  1969  . BLADDER SURGERY  2011  . BREAST BIOPSY Left    benign  . COLONOSCOPY WITH PROPOFOL N/A 02/01/2018   Procedure: COLONOSCOPY WITH PROPOFOL;  Surgeon: [name], MD;  Location: [facility] ENDOSCOPY;  Service: Gastroenterology;  Laterality: N/A;  . LITHOTRIPSY  06/2011    Medical History: Past Medical History:  Diagnosis Date  . Anxiety   . Arthritis   . Depression   . HTN (hypertension)   . Hyperlipidemia   . Kidney stones 2013    Family History: Family History  Problem Relation Age of Onset  . Breast cancer Mother 24      Review of Systems  Constitutional: Negative for activity change, chills, fatigue and unexpected weight change.  HENT: Negative for congestion, postnasal drip, rhinorrhea, sneezing and sore throat.   Respiratory: Negative for cough, chest tightness, shortness of breath and wheezing.   Cardiovascular: Negative for chest pain and palpitations.       Mildly elevated blood pressure today.   Gastrointestinal: Negative for abdominal pain, constipation, diarrhea, nausea and vomiting.  Endocrine: Negative for cold intolerance, heat intolerance, polydipsia and polyuria.  Genitourinary: Positive for frequency and urgency. Negative for dysuria and pelvic pain.       She has seen urology for evaluation of overactive bladder. Unsure if she wants to have surgical repair at this time.   Musculoskeletal: Negative for arthralgias, back pain, joint swelling and neck pain.  Skin: Negative for rash.  Allergic/Immunologic: Positive for environmental allergies.  Neurological: Negative for dizziness, tremors, numbness and headaches.  Hematological: Negative for  adenopathy. Does not bruise/bleed easily.  Psychiatric/Behavioral: Positive for dysphoric mood. Negative for behavioral problems (Depression), sleep disturbance and suicidal ideas. The patient is nervous/anxious.        Well managed with current medications. She is weaning down dosing of alprazolam .     Today's Vitals   10/26/19 1133  BP: (!) 146/90  Pulse: 76  Resp: 16  Temp: (!) 97.5 F (36.4 C)  SpO2: 98%  Weight: 157 lb 3.2 oz (71.3 kg)  Height: 5\' 5"  (1.651 m)   Body mass index is 26.16 kg/m.  Physical Exam Vitals and nursing note reviewed.  Constitutional:      General: She is not in acute distress.    Appearance: Normal appearance. She is well-developed. She is not diaphoretic.  HENT:     Head: Normocephalic and atraumatic.     Right Ear: Tympanic membrane and external ear normal.     Left Ear: Tympanic membrane and external ear normal.     Nose: Nose normal.     Mouth/Throat:     Pharynx: No oropharyngeal exudate.  Eyes:     Pupils: Pupils are equal, round, and reactive to light.  Neck:     Thyroid: No thyromegaly.  Vascular: No carotid bruit or JVD.     Trachea: No tracheal deviation.  Cardiovascular:     Rate and Rhythm: Normal rate and regular rhythm.     Pulses: Normal pulses.     Heart sounds: Normal heart sounds. No murmur heard.  No friction rub. No gallop.   Pulmonary:     Effort: Pulmonary effort is normal. No respiratory distress.     Breath sounds: Normal breath sounds. No wheezing or rales.  Chest:     Chest wall: No tenderness.     Breasts:        Right: Normal. No swelling, bleeding, inverted nipple, mass, nipple discharge, skin change or tenderness.        Left: Normal. No swelling, bleeding, inverted nipple, mass, nipple discharge, skin change or tenderness.  Abdominal:     General: Bowel sounds are normal.     Palpations: Abdomen is soft.     Tenderness: There is no abdominal tenderness.  Musculoskeletal:        General: Normal  range of motion.     Cervical back: Normal range of motion and neck supple.  Lymphadenopathy:     Cervical: No cervical adenopathy.     Upper Body:     Right upper body: No axillary adenopathy.     Left upper body: No axillary adenopathy.  Skin:    General: Skin is warm and dry.  Neurological:     General: No focal deficit present.     Mental Status: She is alert and oriented to person, place, and time.     Cranial Nerves: No cranial nerve deficit.  Psychiatric:        Mood and Affect: Mood normal.        Behavior: Behavior normal.        Thought Content: Thought content normal.        Judgment: Judgment normal.    Depression screen [facility] 2/9 10/26/2019 10/26/2019 06/27/2019 02/27/2019 10/25/2018  Decreased Interest 0 0 0 0 0  Down, Depressed, Hopeless 0 0 0 1 0  PHQ - 2 Score 0 0 0 1 0  Altered sleeping - - - - -  Tired, decreased energy - - - - -  Change in appetite - - - - -  Feeling bad or failure about yourself  - - - - -  Trouble concentrating - - - - -  Moving slowly or fidgety/restless - - - - -  PHQ-9 Score - - - - -  Difficult doing work/chores - - - - -    Functional Status Survey: Is the patient deaf or have difficulty hearing?: No Does the patient have difficulty seeing, even when wearing glasses/contacts?: No Does the patient have difficulty concentrating, remembering, or making decisions?: No Does the patient have difficulty walking or climbing stairs?: No Does the patient have difficulty dressing or bathing?: No Does the patient have difficulty doing errands alone such as visiting a doctor's office or shopping?: No  MMSE - [place] Exam 10/26/2019  Orientation to time 5  Orientation to Place 5  Registration 3  Attention/ Calculation 5  Recall 3  Language- name 2 objects 2  Language- repeat 1  Language- follow 3 step command 3  Language- read & follow direction 1  Write a sentence 1  Copy design 1  Total score 30    Fall Risk  10/26/2019  10/26/2019 06/27/2019 02/27/2019 10/25/2018  Falls in the past year? 0 0 0 0 0  Number falls in past yr: - - - - -  Injury with Fall? - - - - -  Risk for fall due to : No Fall Risks No Fall Risks - - -  Follow up Falls evaluation completed Falls evaluation completed - - -      LABS: Recent Results (from the past 2160 hour(s))  POCT Urine Drug Screen     Status: Abnormal   Collection Time: 10/26/19 11:59 AM  Result Value Ref Range   POC Methamphetamine UR None Detected None Detected   POC Opiate Ur None Detected None Detected   POC Barbiturate UR None Detected None Detected   POC Amphetamine UR None Detected None Detected   POC Oxycodone UR None Detected None Detected   POC Cocaine UR None Detected None Detected   POC Ecstasy UR None Detected None Detected   POC TRICYCLICS UR None Detected None Detected   POC PHENCYCLIDINE UR None Detected None Detected   POC Marijuana UR None Detected None Detected   POC Methadone UR None Detected None Detected   POC BENZODIAZEPINES UR Positive (A) None Detected   URINE TEMPERATURE     POC DRUG SCREEN OXIDANTS URINE     POC SPECIFIC GRAVITY URINE     POC PH URINE     Methylenedioxyamphetamine None Detected None Detected  UA/M w/rflx Culture, Routine     Status: Abnormal   Collection Time: 10/26/19  4:41 PM   Specimen: Urine   Urine  Result Value Ref Range   Specific Gravity, UA 1.008 1.005 - 1.030   pH, UA 8.5 (H) 5.0 - 7.5   Color, UA Yellow Yellow   Appearance Ur Cloudy (A) Clear   Leukocytes,UA Negative Negative   Protein,UA Negative Negative/Trace   Glucose, UA Negative Negative   Ketones, UA Negative Negative   RBC, UA Negative Negative   Bilirubin, UA Negative Negative   Urobilinogen, Ur 0.2 0.2 - 1.0 mg/dL   Nitrite, UA Negative Negative   Microscopic Examination Comment     Comment: Microscopic follows if indicated.   Microscopic Examination See below:     Comment: Microscopic was indicated and was performed.   Urinalysis  Reflex Comment     Comment: This specimen will not reflex to a Urine Culture.  Microscopic Examination     Status: None   Collection Time: 10/26/19  4:41 PM   Urine  Result Value Ref Range   WBC, UA None seen 0 - 5 /hpf   RBC None seen 0 - 2 /hpf   Epithelial Cells (non renal) None seen 0 - 10 /hpf   Casts None seen None seen /lpf   Bacteria, UA None seen None seen/Few    Assessment/Plan: 1. Encounter for general adult medical examination with abnormal findings Annual health maintenance exam today.   2. Mild intermittent asthma without complication May use rescue inhaler as needed and as prescribed  - albuterol (VENTOLIN HFA) 108 (90 Base) MCG/ACT inhaler; Inhale 2 puffs into the lungs every 6 (six) hours as needed for wheezing or shortness of breath.  Dispense: 18 g; Refill: 1  3. Essential hypertension Generally stable. Continue bp medication as prescribed   4. Cutaneous candidiasis Underside of bilateral breasts along bra line. Trial of lotrisone cream. Apply to effected areas twice daily.  - clotrimazole-betamethasone (LOTRISONE) cream; Apply 1 application topically 2 (two) times daily.  Dispense: 45 g; Refill: 1  5. Major depressive disorder, recurrent, moderate (HCC) Well managed. Continue pristiq as prescribed  6. Generalized anxiety disorder Also well managed. Continue wellbutrin as prescribed. Wean down alprazolam dosing to BID from three tablets daily as needed. No new prescription needed at this time.   7. Encounter for screening mammogram for malignant neoplasm of breast Screening mammogram to be ordered.   8. Screening for osteoporosis Bone density test ordered today.   9. Need for vaccination against Streptococcus pneumoniae using pneumococcal conjugate vaccine 13 Prescription for prevnar 13 sent to her pharmacy for administration.  - pneumococcal 13-valent conjugate vaccine (PREVNAR 13) SUSP injection; Inject 0.5 mLs into the muscle once for 1 dose.   Dispense: 0.5 mL; Refill: 0  10. Encounter for long-term (current) use of high-risk medication - POCT Urine Drug Screen appropriately positive for BZO  11. Dysuria - UA/M w/rflx Culture, Routine  General Counseling: Jillyan verbalizes understanding of the findings of todays visit and agrees with plan of treatment. I have discussed any further diagnostic evaluation that may be needed or ordered today. We also reviewed her medications today. she has been encouraged to call the office with any questions or concerns that should arise related to todays visit.    Counseling:  This patient was seen by Leretha Pol FNP Collaboration with Dr Lavera Guise as a part of collaborative care agreement  Orders Placed This Encounter  Procedures  . Microscopic Examination  . UA/M w/rflx Culture, Routine  . POCT Urine Drug Screen    Meds ordered this encounter  Medications  . pneumococcal 13-valent conjugate vaccine (PREVNAR 13) SUSP injection    Sig: Inject 0.5 mLs into the muscle once for 1 dose.    Dispense:  0.5 mL    Refill:  0    Patient allergic to eggs. Unsure if this is contraindication for prevnar 13    Order Specific Question:   Supervising Provider    Answer:   Lavera Guise [[id number]]  . clotrimazole-betamethasone (LOTRISONE) cream    Sig: Apply 1 application topically 2 (two) times daily.    Dispense:  45 g    Refill:  1    Order Specific Question:   Supervising Provider    Answer:   Lavera Guise [[id number]]  . albuterol (VENTOLIN HFA) 108 (90 Base) MCG/ACT inhaler    Sig: Inhale 2 puffs into the lungs every 6 (six) hours as needed for wheezing or shortness of breath.    Dispense:  18 g    Refill:  1    Order Specific Question:   Supervising Provider    Answer:   Lavera Guise [[id number]]    Total time spent: 38 Minutes  Time spent includes review of chart, medications, test results, and follow up plan with the patient.     Lavera Guise, MD  Internal Medicine

## 2019-10-27 LAB — UA/M W/RFLX CULTURE, ROUTINE
Bilirubin, UA: NEGATIVE
Glucose, UA: NEGATIVE
Ketones, UA: NEGATIVE
Leukocytes,UA: NEGATIVE
Nitrite, UA: NEGATIVE
Protein,UA: NEGATIVE
RBC, UA: NEGATIVE
Specific Gravity, UA: 1.008 (ref 1.005–1.030)
Urobilinogen, Ur: 0.2 mg/dL (ref 0.2–1.0)
pH, UA: 8.5 — ABNORMAL HIGH (ref 5.0–7.5)

## 2019-10-27 LAB — MICROSCOPIC EXAMINATION
Bacteria, UA: NONE SEEN
Casts: NONE SEEN /lpf
Epithelial Cells (non renal): NONE SEEN /hpf (ref 0–10)
RBC, Urine: NONE SEEN /hpf (ref 0–2)
WBC, UA: NONE SEEN /hpf (ref 0–5)

## 2019-10-30 ENCOUNTER — Other Ambulatory Visit: Payer: Self-pay

## 2019-10-30 ENCOUNTER — Other Ambulatory Visit: Payer: Self-pay | Admitting: Nurse Practitioner

## 2019-10-30 DIAGNOSIS — Z1231 Encounter for screening mammogram for malignant neoplasm of breast: Secondary | ICD-10-CM

## 2019-11-06 ENCOUNTER — Other Ambulatory Visit: Payer: Self-pay

## 2019-11-06 DIAGNOSIS — J300 Vasomotor rhinitis: Secondary | ICD-10-CM

## 2019-11-06 MED ORDER — FLUTICASONE PROPIONATE 50 MCG/ACT NA SUSP: 1.0000 | Freq: Every day | NASAL | 6 refills | Status: DC

## 2019-11-15 DIAGNOSIS — Z79899 Other long term (current) drug therapy: Secondary | ICD-10-CM | POA: Insufficient documentation

## 2019-11-15 DIAGNOSIS — Z23 Encounter for immunization: Secondary | ICD-10-CM | POA: Insufficient documentation

## 2019-11-15 DIAGNOSIS — J4520 Mild intermittent asthma, uncomplicated: Secondary | ICD-10-CM | POA: Insufficient documentation

## 2019-11-23 ENCOUNTER — Encounter: Payer: Self-pay | Admitting: Nurse Practitioner

## 2019-11-23 ENCOUNTER — Other Ambulatory Visit: Payer: Self-pay

## 2019-11-23 ENCOUNTER — Ambulatory Visit (INDEPENDENT_AMBULATORY_CARE_PROVIDER_SITE_OTHER): Payer: Medicare Other | Admitting: Nurse Practitioner

## 2019-11-23 VITALS — BP 165/89 | HR 84 | Temp 97.5°F | Ht 65.0 in | Wt 159.8 lb

## 2019-11-23 DIAGNOSIS — J4520 Mild intermittent asthma, uncomplicated: Secondary | ICD-10-CM

## 2019-11-23 DIAGNOSIS — F411 Generalized anxiety disorder: Secondary | ICD-10-CM

## 2019-11-23 DIAGNOSIS — I10 Essential (primary) hypertension: Secondary | ICD-10-CM

## 2019-11-23 MED ORDER — ALPRAZOLAM 0.5 MG PO TABS: ORAL_TABLET | ORAL | 1 refills | Status: DC

## 2019-11-23 MED ORDER — LISINOPRIL 10 MG PO TABS: 20.0000 mg | ORAL_TABLET | Freq: Every day | ORAL | 1 refills | Status: DC

## 2019-11-23 NOTE — Progress Notes (Signed)
[facility] [place], [place] [address]  Internal MEDICINE  Office Visit Note  Patient Name: Amanda Mcgee  [id number]  [id number]  Date of Service: 12/17/2019  Chief Complaint  Patient presents with  . Follow-up  . Depression  . Hyperlipidemia  . Hypertension  . Quality Metric Gaps    pap,tetnaus  . controlled substance form    reviewed with PT    The patient presents for follow up visit, has been monitoring her blood pressure everyday. She has brought the log with her. Systolic pressure is generally between 125 and 155. Diastolic pressure is usually between 80 and 90. She is currently taking lisinopril 10mg  daily.  She has also been weaning down her use of alprazolam for acute anxiety. Initially, we cut dosing to 1 tablet in the day and 1 to 2 tablets at bedtime. She has taken this to 1.5 tablets at bedtime and continues 1 tablet during the day when needed. She continues her pristiq and wellbutrin as prescribed. She does feel some "weepy" recently. She states that her husband has been going through some things, and she is getting ready for the holidays without both of her parents. She plans to continue to decrease her night time dose of alprazolam as tolerated.       Current Medication: Outpatient Encounter Medications as of 11/23/2019  Medication Sig  . albuterol (VENTOLIN HFA) 108 (90 Base) MCG/ACT inhaler Inhale 2 puffs into the lungs every 6 (six) hours as needed for wheezing or shortness of breath.  . ALPRAZolam (XANAX) 0.5 MG tablet Take 1 tablet po QAM and 1 to 2 tablets po QHS  . buPROPion (WELLBUTRIN SR) 200 MG 12 hr tablet Take 1 tablet (200 mg total) by mouth 2 (two) times daily.  . calcium carbonate (CALCIUM 600) 600 MG TABS tablet Take 600 mg by mouth 2 (two) times daily with a meal.  . Cholecalciferol (VITAMIN D3) 400 units CAPS Take by mouth daily.  . clotrimazole-betamethasone (LOTRISONE) cream Apply 1 application topically 2 (two) times  daily.  Marland Kitchen CRANBERRY PO Take 1 tablet by mouth 2 (two) times daily.  Marland Kitchen desvenlafaxine (PRISTIQ) 100 MG 24 hr tablet Take 1 tablet (100 mg total) by mouth daily.  Marland Kitchen docusate sodium (COLACE) 100 MG capsule Take 100 mg by mouth 2 (two) times daily.  . fluticasone (FLONASE) 50 MCG/ACT nasal spray Place 1 spray into both nostrils daily.  Marland Kitchen levocetirizine (XYZAL) 5 MG tablet Take 1 tablet (5 mg total) by mouth daily.  Marland Kitchen lisinopril (ZESTRIL) 10 MG tablet Take 2 tablets (20 mg total) by mouth daily.  . meclizine (ANTIVERT) 25 MG tablet Take twice daily as needed for dizziness  . [DISCONTINUED] ALPRAZolam (XANAX) 0.5 MG tablet Take 1 tablet po QAM and 1 to 2 tablets po QHS  . [DISCONTINUED] lisinopril (ZESTRIL) 10 MG tablet Take 1 tablet (10 mg total) by mouth daily.   No facility-administered encounter medications on file as of 11/23/2019.    Surgical History: Past Surgical History:  Procedure Laterality Date  . ABDOMINAL HYSTERECTOMY  1990  . APPENDECTOMY  1969  . BLADDER SURGERY  2011  . BREAST BIOPSY Left    benign  . COLONOSCOPY WITH PROPOFOL N/A 02/01/2018   Procedure: COLONOSCOPY WITH PROPOFOL;  Surgeon: [name], MD;  Location: [facility] ENDOSCOPY;  Service: Gastroenterology;  Laterality: N/A;  . LITHOTRIPSY  06/2011    Medical History: Past Medical History:  Diagnosis Date  . Anxiety   . Arthritis   .  Depression   . HTN (hypertension)   . Hyperlipidemia   . Kidney stones 2013    Family History: Family History  Problem Relation Age of Onset  . Breast cancer Mother 51    Social History   Socioeconomic History  . Marital status: Married    Spouse name: Not on file  . Number of children: Not on file  . Years of education: Not on file  . Highest education level: Not on file  Occupational History  . Not on file  Tobacco Use  . Smoking status: Current Every Day Smoker    Packs/day: 0.50    Years: 30.00    Pack years: 15.00    Types: Cigarettes  . Smokeless tobacco:  Never Used  Vaping Use  . Vaping Use: Never used  Substance and Sexual Activity  . Alcohol use: Yes    Comment: occassionally  . Drug use: No  . Sexual activity: Never  Other Topics Concern  . Not on file  Social History Narrative  . Not on file   Social Determinants of Health   Financial Resource Strain:   . Difficulty of Paying Living Expenses: Not on file  Food Insecurity:   . Worried About Charity fundraiser in the Last Year: Not on file  . Ran Out of Food in the Last Year: Not on file  Transportation Needs:   . Lack of Transportation (Medical): Not on file  . Lack of Transportation (Non-Medical): Not on file  Physical Activity:   . Days of Exercise per Week: Not on file  . Minutes of Exercise per Session: Not on file  Stress:   . Feeling of Stress : Not on file  Social Connections:   . Frequency of Communication with Friends and Family: Not on file  . Frequency of Social Gatherings with Friends and Family: Not on file  . Attends Religious Services: Not on file  . Active Member of Clubs or Organizations: Not on file  . Attends Archivist Meetings: Not on file  . Marital Status: Not on file  Intimate Partner Violence:   . Fear of Current or Ex-Partner: Not on file  . Emotionally Abused: Not on file  . Physically Abused: Not on file  . Sexually Abused: Not on file      Review of Systems  Constitutional: Negative for activity change, chills, fatigue and unexpected weight change.  HENT: Negative for congestion, postnasal drip, rhinorrhea, sneezing and sore throat.   Respiratory: Negative for cough, chest tightness, shortness of breath and wheezing.   Cardiovascular: Negative for chest pain and palpitations.       Blood pressure continues to be mildly to moderately elevated.   Gastrointestinal: Negative for abdominal pain, constipation, diarrhea, nausea and vomiting.  Endocrine: Negative for cold intolerance, heat intolerance, polydipsia and polyuria.   Musculoskeletal: Negative for arthralgias, back pain, joint swelling and neck pain.  Skin: Negative for rash.  Allergic/Immunologic: Positive for environmental allergies.  Neurological: Negative for dizziness, tremors, numbness and headaches.  Hematological: Negative for adenopathy. Does not bruise/bleed easily.  Psychiatric/Behavioral: Positive for dysphoric mood. Negative for behavioral problems (Depression), sleep disturbance and suicidal ideas. The patient is nervous/anxious.        Well managed with current medications. She continues to wean down dosing of alprazolam     Today's Vitals   11/23/19 1043  BP: (!) 165/89  Pulse: 84  Temp: (!) 97.5 F (36.4 C)  SpO2: 97%  Weight: 159 lb 12.8 oz (  72.5 kg)  Height: 5\' 5"  (1.651 m)   Body mass index is 26.59 kg/m.  Physical Exam Vitals and nursing note reviewed.  Constitutional:      General: She is not in acute distress.    Appearance: Normal appearance. She is well-developed. She is not diaphoretic.  HENT:     Head: Normocephalic and atraumatic.     Mouth/Throat:     Pharynx: No oropharyngeal exudate.  Eyes:     Pupils: Pupils are equal, round, and reactive to light.  Neck:     Thyroid: No thyromegaly.     Vascular: No carotid bruit or JVD.     Trachea: No tracheal deviation.  Cardiovascular:     Rate and Rhythm: Normal rate and regular rhythm.     Heart sounds: Normal heart sounds. No murmur heard.  No friction rub. No gallop.   Pulmonary:     Effort: Pulmonary effort is normal. No respiratory distress.     Breath sounds: Normal breath sounds. No wheezing or rales.  Chest:     Chest wall: No tenderness.  Abdominal:     Palpations: Abdomen is soft.  Musculoskeletal:        General: Normal range of motion.     Cervical back: Normal range of motion and neck supple.  Lymphadenopathy:     Cervical: No cervical adenopathy.  Skin:    General: Skin is warm and dry.  Neurological:     Mental Status: She is alert and  oriented to person, place, and time.     Cranial Nerves: No cranial nerve deficit.  Psychiatric:        Mood and Affect: Mood normal.        Behavior: Behavior normal.        Thought Content: Thought content normal.        Judgment: Judgment normal.    Assessment/Plan: 1. Essential hypertension Increase lisinopril dosing to 20mg  daily. Limit salt and increase water intake in the diet. Continue to monitor closely.  - lisinopril (ZESTRIL) 10 MG tablet; Take 2 tablets (20 mg total) by mouth daily.  Dispense: 180 tablet; Refill: 1  2. Generalized anxiety disorder Reduced alprazolam 0.5mg  frequency. May take 1 tablet in the morning and 1 to 2 tablets at bedtime. She should continue to try weaning down the dosing as tolerated. Continue pristiq and wellbutrin as prescribed  - ALPRAZolam (XANAX) 0.5 MG tablet; Take 1 tablet po QAM and 1 to 2 tablets po QHS  Dispense: 90 tablet; Refill: 1  3. Mild intermittent asthma without complication Continue to use rescue inhaler as needed and as prescribed   General Counseling: micole delehanty understanding of the findings of todays visit and agrees with plan of treatment. I have discussed any further diagnostic evaluation that may be needed or ordered today. We also reviewed her medications today. she has been encouraged to call the office with any questions or concerns that should arise related to todays visit.  Hypertension Counseling:   The following hypertensive lifestyle modification were recommended and discussed:  1. Limiting alcohol intake to less than 1 oz/day of ethanol:(24 oz of beer or 8 oz of wine or 2 oz of 100-proof whiskey). 2. Take baby ASA 81 mg daily. 3. Importance of regular aerobic exercise and losing weight. 4. Reduce dietary saturated fat and cholesterol intake for overall cardiovascular health. 5. Maintaining adequate dietary potassium, calcium, and magnesium intake. 6. Regular monitoring of the blood pressure. 7. Reduce sodium  intake to less than  100 mmol/day (less than 2.3 gm of sodium or less than 6 gm of sodium choride)   This patient was seen by [place] with Dr Lavera Guise as a part of collaborative care agreement  Meds ordered this encounter  Medications  . lisinopril (ZESTRIL) 10 MG tablet    Sig: Take 2 tablets (20 mg total) by mouth daily.    Dispense:  180 tablet    Refill:  1    Increasing dose lisinopril to 20mg  - no new prescription needed at this time.    Order Specific Question:   Supervising Provider    Answer:   Lavera Guise [[id number]]  . ALPRAZolam (XANAX) 0.5 MG tablet    Sig: Take 1 tablet po QAM and 1 to 2 tablets po QHS    Dispense:  90 tablet    Refill:  1    Change back to 0.5mg  tablets for regular dosing    Order Specific Question:   Supervising Provider    Answer:   Lavera Guise [[id number]]    Total time spent: 30 Minutes   Time spent includes review of chart, medications, test results, and follow up plan with the patient.      Dr Lavera Guise Internal medicine

## 2019-12-11 ENCOUNTER — Ambulatory Visit
Admission: RE | Admit: 2019-12-11 | Discharge: 2019-12-11 | Disposition: A | Discharge status: Home / Self Care | Payer: Medicare Other | Source: Ambulatory Visit | Attending: Nurse Practitioner | Admitting: Nurse Practitioner

## 2019-12-11 ENCOUNTER — Other Ambulatory Visit: Payer: Self-pay

## 2019-12-11 DIAGNOSIS — M8008XA Age-related osteoporosis with current pathological fracture, vertebra(e), initial encounter for fracture: Secondary | ICD-10-CM

## 2019-12-11 DIAGNOSIS — Z13820 Encounter for screening for osteoporosis: Secondary | ICD-10-CM | POA: Diagnosis not present

## 2019-12-18 ENCOUNTER — Telehealth: Payer: Self-pay

## 2019-12-19 NOTE — Telephone Encounter (Signed)
Ok I will  

## 2019-12-19 NOTE — Telephone Encounter (Signed)
Her blood pressure looks great. I don't want to make any changes right now. Please let her know I am so sorry for her loss.

## 2019-12-19 NOTE — Progress Notes (Signed)
Osteopenia in femur neck.and spine. Normal bone density of radius and total femur.

## 2019-12-22 ENCOUNTER — Ambulatory Visit: Payer: Medicare Other | Admitting: Nurse Practitioner

## 2020-01-04 ENCOUNTER — Telehealth: Payer: Self-pay

## 2020-01-04 NOTE — Telephone Encounter (Signed)
Patient recently lost son on December 4.2021 Spoke with patient this morning requesting to have an appointment made for next week as she is having trouble sleeping and would like her BP checked. Scheduled patient for 01-09-20 to be seen.

## 2020-01-04 NOTE — Telephone Encounter (Signed)
PT called saying she wasn't getting any sleep since her son passed away wanted to know if she could take something over the counter to help with sleep.As per heather pt can take  otc melatonin or Zyquill. And gave to courtney to make appt.

## 2020-01-09 ENCOUNTER — Encounter: Payer: Self-pay | Admitting: Hospice and Palliative Medicine

## 2020-01-09 ENCOUNTER — Ambulatory Visit (INDEPENDENT_AMBULATORY_CARE_PROVIDER_SITE_OTHER): Payer: Medicare Other | Admitting: Hospice and Palliative Medicine

## 2020-01-09 ENCOUNTER — Other Ambulatory Visit: Payer: Self-pay

## 2020-01-09 VITALS — BP 156/90 | HR 80 | Temp 97.1°F | Resp 16 | Ht 65.5 in | Wt 163.0 lb

## 2020-01-09 DIAGNOSIS — T464X5A Adverse effect of angiotensin-converting-enzyme inhibitors, initial encounter: Secondary | ICD-10-CM

## 2020-01-09 DIAGNOSIS — F331 Major depressive disorder, recurrent, moderate: Secondary | ICD-10-CM

## 2020-01-09 DIAGNOSIS — I10 Essential (primary) hypertension: Secondary | ICD-10-CM | POA: Diagnosis not present

## 2020-01-09 DIAGNOSIS — F5101 Primary insomnia: Secondary | ICD-10-CM

## 2020-01-09 DIAGNOSIS — R058 Other specified cough: Secondary | ICD-10-CM | POA: Diagnosis not present

## 2020-01-09 MED ORDER — DOXEPIN HCL 6 MG PO TABS: 6.0000 mg | ORAL_TABLET | Freq: Every day | ORAL | 0 refills | Status: DC

## 2020-01-09 MED ORDER — AMLODIPINE BESYLATE 5 MG PO TABS: 5.0000 mg | ORAL_TABLET | Freq: Every day | ORAL | 3 refills | Status: DC

## 2020-01-09 NOTE — Progress Notes (Signed)
[facility] [address] [place], [place] [address]  Internal MEDICINE  Office Visit Note  Patient Name: Amanda Mcgee  [id number]  [id number]  Date of Service: 01/14/2020  Chief Complaint  Patient presents with  . Follow-up    Trouble sleeping, discuss meds, pt has noticed a cough started around when med dosage was changed 11/23/19, but it has gotten better over the last few day, discuss dexa  . Anxiety  . Depression  . Hyperlipidemia  . Hypertension    HPI Patient is here for routine follow-up She explains she recently lost her son and has been struggling with depression and ability to sleep Has not had more than 2-3 hours of sleep in almost 2 weeks--can tell this is really taking a toll on her mentally and physically Has been taking 2 tablets of 0.5 mg alprazolam at night to help sleep without success, explains she tosses and turns in bed unable to fall asleep  At her last visit, her dose of Lisinopril was increased to 20 mg--since increasing dose   On increased dose of lisinopril she has developed a dry cough, did not have this cough on 10 mg, has had this same side effect before when her dose was increased  Current Medication: Outpatient Encounter Medications as of 01/09/2020  Medication Sig  . amLODipine (NORVASC) 5 MG tablet Take 1 tablet (5 mg total) by mouth daily.  . Doxepin HCl 6 MG TABS Take 1 tablet (6 mg total) by mouth daily.  Marland Kitchen albuterol (VENTOLIN HFA) 108 (90 Base) MCG/ACT inhaler Inhale 2 puffs into the lungs every 6 (six) hours as needed for wheezing or shortness of breath.  . ALPRAZolam (XANAX) 0.5 MG tablet Take 1 tablet po QAM and 1 to 2 tablets po QHS  . buPROPion (WELLBUTRIN SR) 200 MG 12 hr tablet Take 1 tablet (200 mg total) by mouth 2 (two) times daily.  . calcium carbonate (CALCIUM 600) 600 MG TABS tablet Take 600 mg by mouth 2 (two) times daily with a meal.  . Cholecalciferol (VITAMIN D3) 400 units CAPS Take by mouth daily.  .  clotrimazole-betamethasone (LOTRISONE) cream Apply 1 application topically 2 (two) times daily.  Marland Kitchen CRANBERRY PO Take 1 tablet by mouth 2 (two) times daily.  Marland Kitchen desvenlafaxine (PRISTIQ) 100 MG 24 hr tablet Take 1 tablet (100 mg total) by mouth daily.  Marland Kitchen docusate sodium (COLACE) 100 MG capsule Take 100 mg by mouth 2 (two) times daily.  . fluticasone (FLONASE) 50 MCG/ACT nasal spray Place 1 spray into both nostrils daily.  Marland Kitchen levocetirizine (XYZAL) 5 MG tablet Take 1 tablet (5 mg total) by mouth daily.  Marland Kitchen lisinopril (ZESTRIL) 10 MG tablet Take 2 tablets (20 mg total) by mouth daily.  . meclizine (ANTIVERT) 25 MG tablet Take twice daily as needed for dizziness   No facility-administered encounter medications on file as of 01/09/2020.    Surgical History: Past Surgical History:  Procedure Laterality Date  . ABDOMINAL HYSTERECTOMY  1990  . APPENDECTOMY  1969  . BLADDER SURGERY  2011  . BREAST BIOPSY Left    benign  . COLONOSCOPY WITH PROPOFOL N/A 02/01/2018   Procedure: COLONOSCOPY WITH PROPOFOL;  Surgeon: Wyline Mood, MD;  Location: [facility] ENDOSCOPY;  Service: Gastroenterology;  Laterality: N/A;  . LITHOTRIPSY  06/2011    Medical History: Past Medical History:  Diagnosis Date  . Anxiety   . Arthritis   . Depression   . HTN (hypertension)   . Hyperlipidemia   .  Kidney stones 2013    Family History: Family History  Problem Relation Age of Onset  . Breast cancer Mother 82  . Heart attack Son     Social History   Socioeconomic History  . Marital status: Married    Spouse name: Not on file  . Number of children: Not on file  . Years of education: Not on file  . Highest education level: Not on file  Occupational History  . Not on file  Tobacco Use  . Smoking status: Current Every Day Smoker    Packs/day: 0.50    Years: 30.00    Pack years: 15.00    Types: Cigarettes  . Smokeless tobacco: Never Used  Vaping Use  . Vaping Use: Never used  Substance and Sexual Activity  .  Alcohol use: Yes    Comment: occassionally  . Drug use: No  . Sexual activity: Never  Other Topics Concern  . Not on file  Social History Narrative  . Not on file   Social Determinants of Health   Financial Resource Strain: Not on file  Food Insecurity: Not on file  Transportation Needs: Not on file  Physical Activity: Not on file  Stress: Not on file  Social Connections: Not on file  Intimate Partner Violence: Not on file      Review of Systems  Constitutional: Positive for fatigue. Negative for chills and diaphoresis.  HENT: Negative for ear pain, postnasal drip and sinus pressure.   Eyes: Negative for photophobia, discharge, redness, itching and visual disturbance.  Respiratory: Negative for cough, shortness of breath and wheezing.   Cardiovascular: Negative for chest pain, palpitations and leg swelling.  Gastrointestinal: Negative for abdominal pain, constipation, diarrhea, nausea and vomiting.  Genitourinary: Negative for dysuria and flank pain.  Musculoskeletal: Negative for arthralgias, back pain, gait problem and neck pain.  Skin: Negative for color change.  Allergic/Immunologic: Negative for environmental allergies and food allergies.  Neurological: Negative for dizziness and headaches.  Hematological: Does not bruise/bleed easily.  Psychiatric/Behavioral: Positive for sleep disturbance. Negative for agitation, behavioral problems (depression) and hallucinations.       Depressed--recent loss of her son    Vital Signs: BP (!) 156/90   Pulse 80   Temp (!) 97.1 F (36.2 C)   Resp 16   Ht 5' 5.5" (1.664 m)   Wt 163 lb (73.9 kg)   SpO2 99%   BMI 26.71 kg/m    Physical Exam Vitals reviewed.  Constitutional:      Appearance: She is normal weight.  Cardiovascular:     Rate and Rhythm: Normal rate and regular rhythm.     Pulses: Normal pulses.     Heart sounds: Normal heart sounds.  Pulmonary:     Effort: Pulmonary effort is normal.     Breath sounds:  Normal breath sounds.  Musculoskeletal:        General: Normal range of motion.  Skin:    General: Skin is warm.  Neurological:     General: No focal deficit present.     Mental Status: She is alert and oriented to person, place, and time. Mental status is at baseline.  Psychiatric:        Attention and Perception: Attention normal.        Mood and Affect: Affect is tearful.        Speech: Speech normal.        Behavior: Behavior is withdrawn.        Thought Content: Thought content  normal.        Cognition and Memory: Cognition normal.        Judgment: Judgment normal.    Assessment/Plan: 1. Essential hypertension Decrease dose of lisinopril to 10 mg daily, will add 5 mg amlodipine Elevated BP likely multi-factorial--insomnia - amLODipine (NORVASC) 5 MG tablet; Take 1 tablet (5 mg total) by mouth daily.  Dispense: 90 tablet; Refill: 3  2. Primary insomnia Doxepin to be used as needed for sleep--advised to not mix medication with alprazolam Doxepin to allow sleep to avoid negative side effects of insomnia - Doxepin HCl 6 MG TABS; Take 1 tablet (6 mg total) by mouth daily.  Dispense: 30 tablet; Refill: 0  3. Major depressive disorder, recurrent, moderate ([place]) Strongly advised to seek grief counseling Continue with current therapy--will work on insomnia, may need to adjust therapy in the future  4. Cough due to ACE inhibitor Stop increased dose of lisinopril and assess cough response  General Counseling: Wyetta verbalizes understanding of the findings of todays visit and agrees with plan of treatment. I have discussed any further diagnostic evaluation that may be needed or ordered today. We also reviewed her medications today. she has been encouraged to call the office with any questions or concerns that should arise related to todays visit.   Meds ordered this encounter  Medications  . Doxepin HCl 6 MG TABS    Sig: Take 1 tablet (6 mg total) by mouth daily.    Dispense:  30  tablet    Refill:  0  . amLODipine (NORVASC) 5 MG tablet    Sig: Take 1 tablet (5 mg total) by mouth daily.    Dispense:  90 tablet    Refill:  3    Time spent: 30 Minutes Time spent includes review of chart, medications, test results and follow-up plan with the patient.  This patient was seen by Theodoro Grist AGNP-C in Collaboration with Dr Lavera Guise as a part of collaborative care agreement     Tanna Furry. Philisha Weinel AGNP-C Internal medicine

## 2020-01-14 ENCOUNTER — Encounter: Payer: Self-pay | Admitting: Hospice and Palliative Medicine

## 2020-01-18 ENCOUNTER — Other Ambulatory Visit: Payer: Self-pay

## 2020-01-18 DIAGNOSIS — J309 Allergic rhinitis, unspecified: Secondary | ICD-10-CM

## 2020-01-18 MED ORDER — LEVOCETIRIZINE DIHYDROCHLORIDE 5 MG PO TABS: 5.0000 mg | ORAL_TABLET | Freq: Every day | ORAL | 1 refills | Status: DC

## 2020-02-01 ENCOUNTER — Other Ambulatory Visit: Payer: Self-pay

## 2020-02-01 ENCOUNTER — Ambulatory Visit (INDEPENDENT_AMBULATORY_CARE_PROVIDER_SITE_OTHER): Payer: Medicare Other | Admitting: Hospice and Palliative Medicine

## 2020-02-01 ENCOUNTER — Encounter: Payer: Self-pay | Admitting: Hospice and Palliative Medicine

## 2020-02-01 VITALS — BP 136/79 | HR 82 | Temp 98.0°F | Resp 16 | Ht 65.5 in | Wt 163.0 lb

## 2020-02-01 DIAGNOSIS — F411 Generalized anxiety disorder: Secondary | ICD-10-CM

## 2020-02-01 DIAGNOSIS — F331 Major depressive disorder, recurrent, moderate: Secondary | ICD-10-CM | POA: Diagnosis not present

## 2020-02-01 DIAGNOSIS — M8589 Other specified disorders of bone density and structure, multiple sites: Secondary | ICD-10-CM | POA: Diagnosis not present

## 2020-02-01 DIAGNOSIS — G4709 Other insomnia: Secondary | ICD-10-CM | POA: Diagnosis not present

## 2020-02-01 MED ORDER — ALPRAZOLAM 0.5 MG PO TABS: ORAL_TABLET | ORAL | 0 refills | Status: DC

## 2020-02-01 NOTE — Progress Notes (Signed)
[facility] [place], [place] [address]  Internal MEDICINE  Office Visit Note  Patient Name: Amanda Mcgee  [id number]  [id number]  Date of Service: 02/05/2020  Chief Complaint  Patient presents with  . Follow-up    Discuss bone density results    HPI Patient is here for routine follow-up She has been doing some better since our last visit and the recent passing of her son--she seems to be coping better Continues to have bad days and struggles with sleeping Last visit doxepin given to help with insomnia--only taking on nights she hasn't slept in a few days, works well for her  She continues to take alprazolam as needed as well for breakthrough insomnia and depression--also on Wellbutrin as well as Pristiq  Added amlodipine at last visit for BP control, lisinopril decreased to 10 mg daily due to coughing--BP has been well controlled on home readings  BMD reviewed-osteopenia, already taking vitamin D and calcium daily    Current Medication: Outpatient Encounter Medications as of 02/01/2020  Medication Sig  . albuterol (VENTOLIN HFA) 108 (90 Base) MCG/ACT inhaler Inhale 2 puffs into the lungs every 6 (six) hours as needed for wheezing or shortness of breath.  . ALPRAZolam (XANAX) 0.5 MG tablet Take 1 tablet po QAM and 1 to 2 tablets po QHS  . amLODipine (NORVASC) 5 MG tablet Take 1 tablet (5 mg total) by mouth daily.  Marland Kitchen buPROPion (WELLBUTRIN SR) 200 MG 12 hr tablet Take 1 tablet (200 mg total) by mouth 2 (two) times daily.  . calcium carbonate (CALCIUM 600) 600 MG TABS tablet Take 600 mg by mouth 2 (two) times daily with a meal.  . Cholecalciferol (VITAMIN D3) 400 units CAPS Take by mouth daily.  . clotrimazole-betamethasone (LOTRISONE) cream Apply 1 application topically 2 (two) times daily.  Marland Kitchen CRANBERRY PO Take 1 tablet by mouth 2 (two) times daily.  Marland Kitchen desvenlafaxine (PRISTIQ) 100 MG 24 hr tablet Take 1 tablet (100 mg total) by mouth daily.  Marland Kitchen docusate  sodium (COLACE) 100 MG capsule Take 100 mg by mouth 2 (two) times daily.  . Doxepin HCl 6 MG TABS Take 1 tablet (6 mg total) by mouth daily.  . fluticasone (FLONASE) 50 MCG/ACT nasal spray Place 1 spray into both nostrils daily.  Marland Kitchen levocetirizine (XYZAL) 5 MG tablet Take 1 tablet (5 mg total) by mouth daily.  Marland Kitchen lisinopril (ZESTRIL) 10 MG tablet Take 2 tablets (20 mg total) by mouth daily.  . meclizine (ANTIVERT) 25 MG tablet Take twice daily as needed for dizziness  . [DISCONTINUED] ALPRAZolam (XANAX) 0.5 MG tablet Take 1 tablet po QAM and 1 to 2 tablets po QHS   No facility-administered encounter medications on file as of 02/01/2020.    Surgical History: Past Surgical History:  Procedure Laterality Date  . ABDOMINAL HYSTERECTOMY  1990  . APPENDECTOMY  1969  . BLADDER SURGERY  2011  . BREAST BIOPSY Left    benign  . COLONOSCOPY WITH PROPOFOL N/A 02/01/2018   Procedure: COLONOSCOPY WITH PROPOFOL;  Surgeon: [name], MD;  Location: [facility] ENDOSCOPY;  Service: Gastroenterology;  Laterality: N/A;  . LITHOTRIPSY  06/2011    Medical History: Past Medical History:  Diagnosis Date  . Anxiety   . Arthritis   . Depression   . HTN (hypertension)   . Hyperlipidemia   . Kidney stones 2013    Family History: Family History  Problem Relation Age of Onset  . Breast cancer Mother 54  .  Heart attack Son     Social History   Socioeconomic History  . Marital status: Married    Spouse name: Not on file  . Number of children: Not on file  . Years of education: Not on file  . Highest education level: Not on file  Occupational History  . Not on file  Tobacco Use  . Smoking status: Current Every Day Smoker    Packs/day: 0.50    Years: 30.00    Pack years: 15.00    Types: Cigarettes  . Smokeless tobacco: Never Used  Vaping Use  . Vaping Use: Never used  Substance and Sexual Activity  . Alcohol use: Yes    Comment: occassionally  . Drug use: No  . Sexual activity: Never  Other  Topics Concern  . Not on file  Social History Narrative  . Not on file   Social Determinants of Health   Financial Resource Strain: Not on file  Food Insecurity: Not on file  Transportation Needs: Not on file  Physical Activity: Not on file  Stress: Not on file  Social Connections: Not on file  Intimate Partner Violence: Not on file      Review of Systems  Constitutional: Negative for chills, diaphoresis and fatigue.  HENT: Negative for ear pain, postnasal drip and sinus pressure.   Eyes: Negative for photophobia, discharge, redness, itching and visual disturbance.  Respiratory: Negative for cough, shortness of breath and wheezing.   Cardiovascular: Negative for chest pain, palpitations and leg swelling.  Gastrointestinal: Negative for abdominal pain, constipation, diarrhea, nausea and vomiting.  Genitourinary: Negative for dysuria and flank pain.  Musculoskeletal: Negative for arthralgias, back pain, gait problem and neck pain.  Skin: Negative for color change.  Allergic/Immunologic: Negative for environmental allergies and food allergies.  Neurological: Negative for dizziness and headaches.  Hematological: Does not bruise/bleed easily.  Psychiatric/Behavioral: Positive for sleep disturbance. Negative for agitation, behavioral problems (depression) and hallucinations.    Vital Signs: BP 136/79   Pulse 82   Temp 98 F (36.7 C)   Resp 16   Ht 5' 5.5" (1.664 m)   Wt 163 lb (73.9 kg)   SpO2 98%   BMI 26.71 kg/m    Physical Exam Vitals reviewed.  Constitutional:      Appearance: Normal appearance. She is normal weight.  Cardiovascular:     Rate and Rhythm: Normal rate and regular rhythm.     Pulses: Normal pulses.     Heart sounds: Normal heart sounds.  Pulmonary:     Effort: Pulmonary effort is normal.     Breath sounds: Normal breath sounds.  Abdominal:     General: Abdomen is flat.     Palpations: Abdomen is soft.  Musculoskeletal:        General: Normal  range of motion.     Cervical back: Normal range of motion.  Skin:    General: Skin is warm.  Neurological:     General: No focal deficit present.     Mental Status: She is alert and oriented to person, place, and time. Mental status is at baseline.  Psychiatric:        Mood and Affect: Mood normal.        Behavior: Behavior normal.        Thought Content: Thought content normal.        Judgment: Judgment normal.    Assessment/Plan: 1. Osteopenia of multiple sites Continue with calcium and vitamin D supplements for bone health  2. Major  depressive disorder, recurrent, moderate (HCC) Continue with Wellbutrin as well as Pristiq--low dose alprazolam as needed to help with breakthrough and insomnia - ALPRAZolam (XANAX) 0.5 MG tablet; Take 1 tablet po QAM and 1 to 2 tablets po QHS  Dispense: 90 tablet; Refill: 0  3. Other insomnia Continue with Doxepin sparingly for insomnia May need to consider psychiatric referral and CBT  General Counseling: Nelva verbalizes understanding of the findings of todays visit and agrees with plan of treatment. I have discussed any further diagnostic evaluation that may be needed or ordered today. We also reviewed her medications today. she has been encouraged to call the office with any questions or concerns that should arise related to todays visit.   Meds ordered this encounter  Medications  . ALPRAZolam (XANAX) 0.5 MG tablet    Sig: Take 1 tablet po QAM and 1 to 2 tablets po QHS    Dispense:  90 tablet    Refill:  0    Change back to 0.5mg  tablets for regular dosing    Time spent: 30 Minutes Time spent includes review of chart, medications, test results and follow-up plan with the patient.  This patient was seen by Theodoro Grist AGNP-C in Collaboration with Dr Lavera Guise as a part of collaborative care agreement     Tanna Furry. Storm Dulski AGNP-C Internal medicine

## 2020-02-02 ENCOUNTER — Ambulatory Visit: Payer: Medicare Other | Admitting: Hospice and Palliative Medicine

## 2020-02-02 ENCOUNTER — Other Ambulatory Visit: Payer: Self-pay | Admitting: Hospice and Palliative Medicine

## 2020-02-02 DIAGNOSIS — F5101 Primary insomnia: Secondary | ICD-10-CM

## 2020-02-05 ENCOUNTER — Encounter: Payer: Self-pay | Admitting: Hospice and Palliative Medicine

## 2020-02-14 ENCOUNTER — Other Ambulatory Visit: Payer: Self-pay

## 2020-02-14 ENCOUNTER — Ambulatory Visit
Admission: RE | Admit: 2020-02-14 | Discharge: 2020-02-14 | Disposition: A | Discharge status: Home / Self Care | Payer: Medicare Other | Source: Ambulatory Visit | Attending: Nurse Practitioner | Admitting: Nurse Practitioner

## 2020-02-14 DIAGNOSIS — Z1231 Encounter for screening mammogram for malignant neoplasm of breast: Secondary | ICD-10-CM

## 2020-04-01 ENCOUNTER — Other Ambulatory Visit: Payer: Self-pay | Admitting: Hospice and Palliative Medicine

## 2020-04-01 DIAGNOSIS — F331 Major depressive disorder, recurrent, moderate: Secondary | ICD-10-CM

## 2020-04-05 ENCOUNTER — Other Ambulatory Visit: Payer: Self-pay | Admitting: Nurse Practitioner

## 2020-04-05 DIAGNOSIS — F331 Major depressive disorder, recurrent, moderate: Secondary | ICD-10-CM

## 2020-04-17 ENCOUNTER — Encounter: Payer: Self-pay | Admitting: Dermatology

## 2020-04-17 ENCOUNTER — Ambulatory Visit (INDEPENDENT_AMBULATORY_CARE_PROVIDER_SITE_OTHER): Payer: Medicare Other | Admitting: Dermatology

## 2020-04-17 ENCOUNTER — Other Ambulatory Visit: Payer: Self-pay

## 2020-04-17 DIAGNOSIS — L814 Other melanin hyperpigmentation: Secondary | ICD-10-CM

## 2020-04-17 DIAGNOSIS — L820 Inflamed seborrheic keratosis: Secondary | ICD-10-CM

## 2020-04-17 DIAGNOSIS — L821 Other seborrheic keratosis: Secondary | ICD-10-CM

## 2020-04-17 DIAGNOSIS — D1800 Hemangioma unspecified site: Secondary | ICD-10-CM

## 2020-04-17 DIAGNOSIS — L578 Other skin changes due to chronic exposure to nonionizing radiation: Secondary | ICD-10-CM | POA: Diagnosis not present

## 2020-04-17 DIAGNOSIS — Z1283 Encounter for screening for malignant neoplasm of skin: Secondary | ICD-10-CM | POA: Diagnosis not present

## 2020-04-17 DIAGNOSIS — D229 Melanocytic nevi, unspecified: Secondary | ICD-10-CM

## 2020-04-17 NOTE — Progress Notes (Signed)
   Follow-Up Visit   Subjective  Amanda Mcgee is a 64 y.o. female who presents for the following: Annual Exam (TBSE today). The patient presents for Total-Body Skin Exam (TBSE) for skin cancer screening and mole check.  The following portions of the chart were reviewed this encounter and updated as appropriate:   Tobacco  Allergies  Meds  Problems  Med Hx  Surg Hx  Fam Hx     Review of Systems:  No other skin or systemic complaints except as noted in HPI or Assessment and Plan.  Objective  Well appearing patient in no apparent distress; mood and affect are within normal limits.  A full examination was performed including scalp, head, eyes, ears, nose, lips, neck, chest, axillae, abdomen, back, buttocks, bilateral upper extremities, bilateral lower extremities, hands, feet, fingers, toes, fingernails, and toenails. All findings within normal limits unless otherwise noted below.  Objective  Scalp: Erythematous keratotic or waxy stuck-on papule or plaque.    Assessment & Plan    Lentigines - Scattered tan macules - Due to sun exposure - Benign-appering, observe - Recommend daily broad spectrum sunscreen SPF 30+ to sun-exposed areas, reapply every 2 hours as needed. - Call for any changes  Seborrheic Keratoses - Stuck-on, waxy, tan-brown papules and/or plaques  - Benign-appearing - Discussed benign etiology and prognosis. - Observe - Call for any changes  Melanocytic Nevi - Tan-brown and/or pink-flesh-colored symmetric macules and papules - Benign appearing on exam today - Observation - Call clinic for new or changing moles - Recommend daily use of broad spectrum spf 30+ sunscreen to sun-exposed areas.   Hemangiomas - Red papules - Discussed benign nature - Observe - Call for any changes  Actinic Damage - Chronic condition, secondary to cumulative UV/sun exposure - diffuse scaly erythematous macules with underlying dyspigmentation - Recommend daily broad  spectrum sunscreen SPF 30+ to sun-exposed areas, reapply every 2 hours as needed.  - Staying in the shade or wearing long sleeves, sun glasses (UVA+UVB protection) and wide brim hats (4-inch brim around the entire circumference of the hat) are also recommended for sun protection.  - Call for new or changing lesions.  Skin cancer screening performed today.  Inflamed seborrheic keratosis Scalp  Destruction of lesion - Scalp Complexity: simple   Destruction method: cryotherapy   Informed consent: discussed and consent obtained   Timeout:  patient name, date of birth, surgical site, and procedure verified Lesion destroyed using liquid nitrogen: Yes   Region frozen until ice ball extended beyond lesion: Yes   Outcome: patient tolerated procedure well with no complications   Post-procedure details: wound care instructions given    Return in about 1 year (around 04/17/2021) for TBSE.  I, Ashok Cordia, CMA, am acting as scribe for Sarina Ser, MD .  Documentation: I have reviewed the above documentation for accuracy and completeness, and I agree with the above.  Sarina Ser, MD

## 2020-04-17 NOTE — Patient Instructions (Signed)

## 2020-04-18 ENCOUNTER — Encounter: Payer: Self-pay | Admitting: Physician Assistant

## 2020-04-18 ENCOUNTER — Ambulatory Visit (INDEPENDENT_AMBULATORY_CARE_PROVIDER_SITE_OTHER): Payer: Medicare Other | Admitting: Physician Assistant

## 2020-04-18 DIAGNOSIS — R300 Dysuria: Secondary | ICD-10-CM | POA: Diagnosis not present

## 2020-04-18 DIAGNOSIS — I10 Essential (primary) hypertension: Secondary | ICD-10-CM | POA: Diagnosis not present

## 2020-04-18 LAB — POCT URINALYSIS DIPSTICK
Bilirubin, UA: NEGATIVE
Glucose, UA: NEGATIVE
Ketones, UA: NEGATIVE
Nitrite, UA: NEGATIVE
Protein, UA: POSITIVE — AB
Spec Grav, UA: 1.01 (ref 1.010–1.025)
Urobilinogen, UA: 0.2 E.U./dL
pH, UA: 5 (ref 5.0–8.0)

## 2020-04-18 MED ORDER — DOXYCYCLINE HYCLATE 100 MG PO TABS: 100.0000 mg | ORAL_TABLET | Freq: Two times a day (BID) | ORAL | 0 refills | Status: DC

## 2020-04-18 NOTE — Progress Notes (Signed)
[facility] [place],  [address]  Internal MEDICINE  Office Visit Note  Patient Name: Amanda Mcgee  [id number]  [id number]  Date of Service: 04/18/2020  Chief Complaint  Patient presents with  . Urinary Tract Infection  . Abdominal Pain  . Back Pain    Lower      HPI Pt is here for a sick visit. -She has had burning and spasms that started on Sunday. Tried lemon water without help then did azo and tylenol for last 2 days which helped some but still burning and some low back pain -Always has had blood in urine and was checked by urology who did work up including renal US and everything was fine. This was 3 years ago she thinks. Everyone in family has this problem. She also has hx of bladder surgery which made her worse and went to PT for that and then went to Dr. Jacqlyn Larsen and didn't want to do more surgery bc of scar surgery. Wears briefs all the time. -allergy to macrobid and cipro -Given doxy 100mg  last time and it worked well  Current Medication:  Outpatient Encounter Medications as of 04/18/2020  Medication Sig  . albuterol (VENTOLIN HFA) 108 (90 Base) MCG/ACT inhaler Inhale 2 puffs into the lungs every 6 (six) hours as needed for wheezing or shortness of breath.  . ALPRAZolam (XANAX) 0.5 MG tablet Take half tab in am and one and half at night for sleep ( this is change in direction)  . amLODipine (NORVASC) 5 MG tablet Take 1 tablet (5 mg total) by mouth daily.  Marland Kitchen buPROPion (WELLBUTRIN SR) 200 MG 12 hr tablet Take 1 tablet (200 mg total) by mouth 2 (two) times daily.  . calcium carbonate (OS-CAL) 600 MG TABS tablet Take 600 mg by mouth 2 (two) times daily with a meal.  . Cholecalciferol (VITAMIN D3) 400 units CAPS Take by mouth daily.  . clotrimazole-betamethasone (LOTRISONE) cream Apply 1 application topically 2 (two) times daily.  Marland Kitchen CRANBERRY PO Take 1 tablet by mouth 2 (two) times daily.  Marland Kitchen desvenlafaxine (PRISTIQ) 100 MG 24 hr tablet Take 1  tablet (100 mg total) by mouth daily.  Marland Kitchen docusate sodium (COLACE) 100 MG capsule Take 100 mg by mouth 2 (two) times daily.  . Doxepin HCl 6 MG TABS Take 1 tablet (6 mg total) by mouth daily.  Marland Kitchen doxycycline (VIBRA-TABS) 100 MG tablet Take 1 tablet (100 mg total) by mouth 2 (two) times daily.  . fluticasone (FLONASE) 50 MCG/ACT nasal spray Place 1 spray into both nostrils daily.  Marland Kitchen levocetirizine (XYZAL) 5 MG tablet Take 1 tablet (5 mg total) by mouth daily.  Marland Kitchen lisinopril (ZESTRIL) 10 MG tablet Take 2 tablets (20 mg total) by mouth daily.  . meclizine (ANTIVERT) 25 MG tablet Take twice daily as needed for dizziness   No facility-administered encounter medications on file as of 04/18/2020.      Medical History: Past Medical History:  Diagnosis Date  . Anxiety   . Arthritis   . Depression   . HTN (hypertension)   . Hyperlipidemia   . Kidney stones 2013     Vital Signs: BP 138/80   Pulse 78   Temp 97.8 F (36.6 C)   Resp 16   Ht 5\' 5"  (1.651 m)   Wt 159 lb (72.1 kg)   SpO2 98%   BMI 26.46 kg/m    Review of Systems  Constitutional: Negative for chills, fatigue and fever.  HENT:  Negative for congestion, mouth sores and postnasal drip.   Respiratory: Negative for cough.   Cardiovascular: Negative for chest pain.  Gastrointestinal: Negative for abdominal pain.  Genitourinary: Positive for dysuria, flank pain, frequency and urgency.  Psychiatric/Behavioral: The patient is nervous/anxious.     Physical Exam Vitals and nursing note reviewed.  Constitutional:      General: She is not in acute distress.    Appearance: She is well-developed. She is not diaphoretic.  HENT:     Head: Normocephalic and atraumatic.     Mouth/Throat:     Pharynx: No oropharyngeal exudate.  Eyes:     Pupils: Pupils are equal, round, and reactive to light.  Neck:     Thyroid: No thyromegaly.     Vascular: No JVD.     Trachea: No tracheal deviation.  Cardiovascular:     Rate and Rhythm: Normal  rate and regular rhythm.     Heart sounds: Normal heart sounds. No murmur heard. No friction rub. No gallop.   Pulmonary:     Effort: Pulmonary effort is normal. No respiratory distress.     Breath sounds: No wheezing or rales.  Chest:     Chest wall: No tenderness.  Abdominal:     General: Abdomen is flat. Bowel sounds are normal.     Palpations: Abdomen is soft.     Tenderness: There is no abdominal tenderness. There is right CVA tenderness and left CVA tenderness.  Musculoskeletal:        General: Normal range of motion.     Cervical back: Normal range of motion and neck supple.  Lymphadenopathy:     Cervical: No cervical adenopathy.  Skin:    General: Skin is warm and dry.  Neurological:     Mental Status: She is alert and oriented to person, place, and time.     Cranial Nerves: No cranial nerve deficit.  Psychiatric:        Behavior: Behavior normal.        Thought Content: Thought content normal.        Judgment: Judgment normal.       Assessment/Plan: 1. Dysuria - POCT Urinalysis Dipstick +blood, protein, and leukocytes--send for culture - CULTURE, URINE COMPREHENSIVE -Will go ahead and start doxycycline since pt has allergy to macrobid and cipro and will adjust pending culture results - doxycycline (VIBRA-TABS) 100 MG tablet; Take 1 tablet (100 mg total) by mouth 2 (two) times daily.  Dispense: 20 tablet; Refill: 0  2. Essential hypertension Stable, continue current medication    General Counseling: kemi gell understanding of the findings of todays visit and agrees with plan of treatment. I have discussed any further diagnostic evaluation that may be needed or ordered today. We also reviewed her medications today. she has been encouraged to call the office with any questions or concerns that should arise related to todays visit.    Counseling:    Orders Placed This Encounter  Procedures  . CULTURE, URINE COMPREHENSIVE  . POCT Urinalysis Dipstick     Meds ordered this encounter  Medications  . doxycycline (VIBRA-TABS) 100 MG tablet    Sig: Take 1 tablet (100 mg total) by mouth 2 (two) times daily.    Dispense:  20 tablet    Refill:  0    Time spent:30  Minutes

## 2020-04-23 LAB — CULTURE, URINE COMPREHENSIVE

## 2020-04-30 ENCOUNTER — Ambulatory Visit (INDEPENDENT_AMBULATORY_CARE_PROVIDER_SITE_OTHER): Payer: Medicare Other | Admitting: Hospice and Palliative Medicine

## 2020-04-30 ENCOUNTER — Encounter: Payer: Self-pay | Admitting: Hospice and Palliative Medicine

## 2020-04-30 ENCOUNTER — Other Ambulatory Visit: Payer: Self-pay

## 2020-04-30 VITALS — BP 137/89 | HR 78 | Temp 98.4°F | Resp 16 | Ht 65.0 in | Wt 159.2 lb

## 2020-04-30 DIAGNOSIS — F331 Major depressive disorder, recurrent, moderate: Secondary | ICD-10-CM

## 2020-04-30 DIAGNOSIS — R5383 Other fatigue: Secondary | ICD-10-CM

## 2020-04-30 DIAGNOSIS — R300 Dysuria: Secondary | ICD-10-CM

## 2020-04-30 DIAGNOSIS — E559 Vitamin D deficiency, unspecified: Secondary | ICD-10-CM | POA: Diagnosis not present

## 2020-04-30 DIAGNOSIS — R319 Hematuria, unspecified: Secondary | ICD-10-CM

## 2020-04-30 DIAGNOSIS — I10 Essential (primary) hypertension: Secondary | ICD-10-CM | POA: Diagnosis not present

## 2020-04-30 LAB — POCT URINALYSIS DIPSTICK
Bilirubin, UA: NEGATIVE
Glucose, UA: NEGATIVE
Ketones, UA: NEGATIVE
Leukocytes, UA: NEGATIVE
Nitrite, UA: NEGATIVE
Protein, UA: NEGATIVE
Spec Grav, UA: 1.01 (ref 1.010–1.025)
Urobilinogen, UA: 0.2 E.U./dL
pH, UA: 5 (ref 5.0–8.0)

## 2020-04-30 MED ORDER — ALPRAZOLAM 0.5 MG PO TABS: ORAL_TABLET | ORAL | 0 refills | Status: DC

## 2020-04-30 MED ORDER — DESVENLAFAXINE SUCCINATE ER 100 MG PO TB24: 100.0000 mg | ORAL_TABLET | Freq: Every day | ORAL | 1 refills | Status: DC

## 2020-04-30 NOTE — Progress Notes (Signed)
[facility] [place], [place] [address]  Internal MEDICINE  Office Visit Note   Patient Name: Amanda Mcgee  [id number]  [id number]  Date of Service: 05/07/2020  Chief Complaint  Patient presents with  . Anxiety  . Depression  . Hypertension    HPI Patient is here for routine follow-up Has completed course of doxycycline for UTI at last visit Symptoms have improved, repeat urine dipstick today shows resolution but large amount of RBC's remain present Has had work-up with urology in the past including renal US which was normal per her reports  Continues to struggle with the sudden passing of her son a few months ago Requesting refills of alprazolam she takes as needed morning and at bedtime to help control anxiety as well as insomnia Dosing has been cut back from previous dosing recently  Current Medication: Outpatient Encounter Medications as of 04/30/2020  Medication Sig  . albuterol (VENTOLIN HFA) 108 (90 Base) MCG/ACT inhaler Inhale 2 puffs into the lungs every 6 (six) hours as needed for wheezing or shortness of breath.  . ALPRAZolam (XANAX) 0.5 MG tablet Take half tab in am and one and half at night for sleep ( this is change in direction)  . amLODipine (NORVASC) 5 MG tablet Take 1 tablet (5 mg total) by mouth daily.  Marland Kitchen buPROPion (WELLBUTRIN SR) 200 MG 12 hr tablet Take 1 tablet (200 mg total) by mouth 2 (two) times daily.  . calcium carbonate (OS-CAL) 600 MG TABS tablet Take 600 mg by mouth 2 (two) times daily with a meal.  . Cholecalciferol (VITAMIN D3) 400 units CAPS Take by mouth daily.  . clotrimazole-betamethasone (LOTRISONE) cream Apply 1 application topically 2 (two) times daily.  Marland Kitchen CRANBERRY PO Take 1 tablet by mouth 2 (two) times daily.  Marland Kitchen desvenlafaxine (PRISTIQ) 100 MG 24 hr tablet Take 1 tablet (100 mg total) by mouth daily.  Marland Kitchen docusate sodium (COLACE) 100 MG capsule Take 100 mg by mouth 2 (two) times daily.  Marland Kitchen doxycycline (VIBRA-TABS)  100 MG tablet Take 1 tablet (100 mg total) by mouth 2 (two) times daily.  . fluticasone (FLONASE) 50 MCG/ACT nasal spray Place 1 spray into both nostrils daily.  Marland Kitchen levocetirizine (XYZAL) 5 MG tablet Take 1 tablet (5 mg total) by mouth daily.  Marland Kitchen lisinopril (ZESTRIL) 10 MG tablet Take 2 tablets (20 mg total) by mouth daily.  . meclizine (ANTIVERT) 25 MG tablet Take twice daily as needed for dizziness  . [DISCONTINUED] ALPRAZolam (XANAX) 0.5 MG tablet Take half tab in am and one and half at night for sleep ( this is change in direction)  . [DISCONTINUED] desvenlafaxine (PRISTIQ) 100 MG 24 hr tablet Take 1 tablet (100 mg total) by mouth daily.  . [DISCONTINUED] Doxepin HCl 6 MG TABS Take 1 tablet (6 mg total) by mouth daily.   No facility-administered encounter medications on file as of 04/30/2020.    Surgical History: Past Surgical History:  Procedure Laterality Date  . ABDOMINAL HYSTERECTOMY  1990  . APPENDECTOMY  1969  . BLADDER SURGERY  2011  . BREAST BIOPSY Left    benign  . COLONOSCOPY WITH PROPOFOL N/A 02/01/2018   Procedure: COLONOSCOPY WITH PROPOFOL;  Surgeon: [name], MD;  Location: [facility] ENDOSCOPY;  Service: Gastroenterology;  Laterality: N/A;  . LITHOTRIPSY  06/2011    Medical History: Past Medical History:  Diagnosis Date  . Anxiety   . Arthritis   . Depression   . HTN (hypertension)   .  Hyperlipidemia   . Kidney stones 2013    Family History: Family History  Problem Relation Age of Onset  . Breast cancer Mother 83  . Heart attack Son     Social History   Socioeconomic History  . Marital status: Married    Spouse name: Not on file  . Number of children: Not on file  . Years of education: Not on file  . Highest education level: Not on file  Occupational History  . Not on file  Tobacco Use  . Smoking status: Current Every Day Smoker    Packs/day: 0.50    Years: 30.00    Pack years: 15.00    Types: Cigarettes  . Smokeless tobacco: Never Used  Vaping  Use  . Vaping Use: Never used  Substance and Sexual Activity  . Alcohol use: Yes    Comment: occassionally  . Drug use: No  . Sexual activity: Never  Other Topics Concern  . Not on file  Social History Narrative  . Not on file   Social Determinants of Health   Financial Resource Strain: Not on file  Food Insecurity: Not on file  Transportation Needs: Not on file  Physical Activity: Not on file  Stress: Not on file  Social Connections: Not on file  Intimate Partner Violence: Not on file      Review of Systems  Constitutional: Negative for chills, diaphoresis and fatigue.  HENT: Negative for ear pain, postnasal drip and sinus pressure.   Eyes: Negative for photophobia, discharge, redness, itching and visual disturbance.  Respiratory: Negative for cough, shortness of breath and wheezing.   Cardiovascular: Negative for chest pain, palpitations and leg swelling.  Gastrointestinal: Negative for abdominal pain, constipation, diarrhea, nausea and vomiting.  Genitourinary: Negative for dysuria and flank pain.  Musculoskeletal: Negative for arthralgias, back pain, gait problem and neck pain.  Skin: Negative for color change.  Allergic/Immunologic: Negative for environmental allergies and food allergies.  Neurological: Negative for dizziness and headaches.  Hematological: Does not bruise/bleed easily.  Psychiatric/Behavioral: Negative for agitation, behavioral problems (depression) and hallucinations.    Vital Signs: BP 137/89   Pulse 78   Temp 98.4 F (36.9 C)   Resp 16   Ht 5\' 5"  (1.651 m)   Wt 159 lb 3.2 oz (72.2 kg)   SpO2 96%   BMI 26.49 kg/m    Physical Exam Vitals reviewed.  Constitutional:      Appearance: Normal appearance. She is normal weight.  Cardiovascular:     Rate and Rhythm: Normal rate and regular rhythm.     Pulses: Normal pulses.     Heart sounds: Normal heart sounds.  Pulmonary:     Effort: Pulmonary effort is normal.     Breath sounds: Normal  breath sounds.  Abdominal:     General: Abdomen is flat.     Palpations: Abdomen is soft.  Musculoskeletal:        General: Normal range of motion.     Cervical back: Normal range of motion.  Skin:    General: Skin is warm.  Neurological:     General: No focal deficit present.     Mental Status: She is alert and oriented to person, place, and time. Mental status is at baseline.  Psychiatric:        Mood and Affect: Mood normal.        Behavior: Behavior normal.        Thought Content: Thought content normal.  Judgment: Judgment normal.    Assessment/Plan: 1. Hematuria, unspecified type Long standing history of hematuria--has been evaluated by urology in the past with negative work-up Will need to consider possible bladder US at next visit for further evaluation  2. Essential hypertension BP and HR remain well controlled on present management, continue to monitor  3. Major depressive disorder, recurrent, moderate (HCC) Requesting refills of alprazolam, encouraged to take sparingly on an as needed basis, will need to continue working on optimizing therapy to help with weaning from BZO use [place] Controlled Substance Database was reviewed by me for overdose risk score (ORS) Reviewed risks and possible side effects associated with taking opiates, benzodiazepines and other CNS depressants. Combination of these could cause dizziness and drowsiness. Advised patient not to drive or operate machinery when taking these medications, as patient's and other's life can be at risk and will have consequences. Patient verbalized understanding in this matter. Dependence and abuse for these drugs will be monitored closely. A Controlled substance policy and procedure is on file which allows [facility] to order a urine drug screen test at any visit. Patient understands and agrees with the plan - ALPRAZolam (XANAX) 0.5 MG tablet; Take half tab in am and one and half at night for sleep ( this  is change in direction)  Dispense: 60 tablet; Refill: 0 - desvenlafaxine (PRISTIQ) 100 MG 24 hr tablet; Take 1 tablet (100 mg total) by mouth daily.  Dispense: 90 tablet; Refill: 1  4. Vitamin D deficiency Continue with vitamin D supplements, will review updated levels and adjust care accordingly - Vitamin D (25 hydroxy)  5. Other fatigue - CBC w/Diff/Platelet - Comprehensive Metabolic Panel (CMET) - Lipid Panel With LDL/HDL Ratio - TSH + free T4  6. Dysuria - POCT Urinalysis Dipstick  General Counseling: Meela verbalizes understanding of the findings of todays visit and agrees with plan of treatment. I have discussed any further diagnostic evaluation that may be needed or ordered today. We also reviewed her medications today. she has been encouraged to call the office with any questions or concerns that should arise related to todays visit.    Orders Placed This Encounter  Procedures  . CBC w/Diff/Platelet  . Comprehensive Metabolic Panel (CMET)  . Lipid Panel With LDL/HDL Ratio  . TSH + free T4  . Vitamin D (25 hydroxy)  . POCT Urinalysis Dipstick    Meds ordered this encounter  Medications  . ALPRAZolam (XANAX) 0.5 MG tablet    Sig: Take half tab in am and one and half at night for sleep ( this is change in direction)    Dispense:  60 tablet    Refill:  0    Not to exceed 5 additional fills before 07/30/2020 DX Code Needed  .  . desvenlafaxine (PRISTIQ) 100 MG 24 hr tablet    Sig: Take 1 tablet (100 mg total) by mouth daily.    Dispense:  90 tablet    Refill:  1    Ok to fill as 90 day prescription    Time spent: 30 Minutes Time spent includes review of chart, medications, test results and follow-up plan with the patient.  This patient was seen by Theodoro Grist AGNP-C in Collaboration with Dr Lavera Guise as a part of collaborative care agreement     Tanna Furry. Dusten Ellinwood AGNP-C Internal medicine

## 2020-05-01 ENCOUNTER — Other Ambulatory Visit: Payer: Self-pay | Admitting: Nurse Practitioner

## 2020-05-01 DIAGNOSIS — J300 Vasomotor rhinitis: Secondary | ICD-10-CM

## 2020-05-07 ENCOUNTER — Encounter: Payer: Self-pay | Admitting: Hospice and Palliative Medicine

## 2020-05-30 ENCOUNTER — Other Ambulatory Visit: Payer: Self-pay | Admitting: Internal Medicine

## 2020-05-30 DIAGNOSIS — F331 Major depressive disorder, recurrent, moderate: Secondary | ICD-10-CM

## 2020-07-14 ENCOUNTER — Other Ambulatory Visit: Payer: Self-pay | Admitting: Nurse Practitioner

## 2020-07-14 DIAGNOSIS — J309 Allergic rhinitis, unspecified: Secondary | ICD-10-CM

## 2020-07-31 ENCOUNTER — Other Ambulatory Visit: Payer: Self-pay | Admitting: Nurse Practitioner

## 2020-07-31 ENCOUNTER — Other Ambulatory Visit: Payer: Self-pay

## 2020-07-31 DIAGNOSIS — J309 Allergic rhinitis, unspecified: Secondary | ICD-10-CM

## 2020-07-31 DIAGNOSIS — J300 Vasomotor rhinitis: Secondary | ICD-10-CM

## 2020-07-31 MED ORDER — LEVOCETIRIZINE DIHYDROCHLORIDE 5 MG PO TABS: 5.0000 mg | ORAL_TABLET | Freq: Every day | ORAL | 1 refills | Status: DC

## 2020-07-31 MED ORDER — FLUTICASONE PROPIONATE 50 MCG/ACT NA SUSP: 1.0000 | Freq: Every day | NASAL | 6 refills | Status: DC

## 2020-08-27 ENCOUNTER — Ambulatory Visit (INDEPENDENT_AMBULATORY_CARE_PROVIDER_SITE_OTHER): Payer: Medicare Other | Admitting: Nurse Practitioner

## 2020-08-27 ENCOUNTER — Other Ambulatory Visit: Payer: Self-pay

## 2020-08-27 ENCOUNTER — Encounter: Payer: Self-pay | Admitting: Nurse Practitioner

## 2020-08-27 VITALS — BP 136/64 | HR 75 | Temp 97.3°F | Resp 16 | Ht 65.5 in | Wt 161.8 lb

## 2020-08-27 DIAGNOSIS — F331 Major depressive disorder, recurrent, moderate: Secondary | ICD-10-CM

## 2020-08-27 DIAGNOSIS — F411 Generalized anxiety disorder: Secondary | ICD-10-CM | POA: Diagnosis not present

## 2020-08-27 DIAGNOSIS — I10 Essential (primary) hypertension: Secondary | ICD-10-CM

## 2020-08-27 DIAGNOSIS — Z79899 Other long term (current) drug therapy: Secondary | ICD-10-CM

## 2020-08-27 LAB — POCT URINE DRUG SCREEN
Methylenedioxyamphetamine: NOT DETECTED
POC Amphetamine UR: NOT DETECTED
POC BENZODIAZEPINES UR: POSITIVE — AB
POC Barbiturate UR: NOT DETECTED
POC Cocaine UR: NOT DETECTED
POC Ecstasy UR: NOT DETECTED
POC Marijuana UR: NOT DETECTED
POC Methadone UR: NOT DETECTED
POC Methamphetamine UR: NOT DETECTED
POC Opiate Ur: NOT DETECTED
POC Oxycodone UR: NOT DETECTED
POC PHENCYCLIDINE UR: NOT DETECTED
POC TRICYCLICS UR: NOT DETECTED

## 2020-08-27 MED ORDER — LISINOPRIL 10 MG PO TABS: 10.0000 mg | ORAL_TABLET | Freq: Every day | ORAL | 1 refills | Status: DC

## 2020-08-27 MED ORDER — ALPRAZOLAM 0.5 MG PO TABS: ORAL_TABLET | ORAL | 2 refills | Status: DC

## 2020-08-27 MED ORDER — DESVENLAFAXINE SUCCINATE ER 100 MG PO TB24: 100.0000 mg | ORAL_TABLET | Freq: Every day | ORAL | 1 refills | Status: DC

## 2020-08-27 MED ORDER — AMLODIPINE BESYLATE 5 MG PO TABS: 5.0000 mg | ORAL_TABLET | Freq: Every day | ORAL | 3 refills | Status: DC

## 2020-08-27 MED ORDER — BUPROPION HCL ER (SR) 200 MG PO TB12: 200.0000 mg | ORAL_TABLET | Freq: Two times a day (BID) | ORAL | 3 refills | Status: DC

## 2020-08-27 NOTE — Progress Notes (Signed)
[facility] [place], [place] [address]  Internal MEDICINE  Office Visit Note  Patient Name: Amanda Mcgee  [id number]  [id number]  Date of Service: 08/27/2020  Chief Complaint  Patient presents with   Follow-up    Review labs, refills, has had difficulty staying asleep   Depression   Hyperlipidemia   Hypertension   Anxiety    HPI Amanda Mcgee presents for a follow up visit to review lab results, request refills and discuss difficulty sleeping. She has a history of anxiety, arthritis depression, hyperlipidemia, hypertension, and kidney stones. Her surgical history is significant for hysterectomy, left breast biopsy and appendectomy. She had her labs ordered in April but has not yet had them drawn.  -blood pressure is well controlled.  -UDS due today   Current Medication: Outpatient Encounter Medications as of 08/27/2020  Medication Sig   albuterol (VENTOLIN HFA) 108 (90 Base) MCG/ACT inhaler Inhale 2 puffs into the lungs every 6 (six) hours as needed for wheezing or shortness of breath.   calcium carbonate (OS-CAL) 600 MG TABS tablet Take 600 mg by mouth 2 (two) times daily with a meal.   Cholecalciferol (VITAMIN D3) 400 units CAPS Take by mouth daily.   clotrimazole-betamethasone (LOTRISONE) cream Apply 1 application topically 2 (two) times daily.   CRANBERRY PO Take 1 tablet by mouth 2 (two) times daily.   docusate sodium (COLACE) 100 MG capsule Take 100 mg by mouth 2 (two) times daily.   fluticasone (FLONASE) 50 MCG/ACT nasal spray Place 1 spray into both nostrils daily.   levocetirizine (XYZAL) 5 MG tablet Take 1 tablet (5 mg total) by mouth daily.   meclizine (ANTIVERT) 25 MG tablet Take twice daily as needed for dizziness   [DISCONTINUED] ALPRAZolam (XANAX) 0.5 MG tablet TAKE HALF TAB IN AM AND ONE AND HALF AT NIGHT FOR SLEEP ( THIS IS CHANGE IN DIRECTION)   [DISCONTINUED] amLODipine (NORVASC) 5 MG tablet Take 1 tablet (5 mg total) by mouth daily.    [DISCONTINUED] buPROPion (WELLBUTRIN SR) 200 MG 12 hr tablet Take 1 tablet (200 mg total) by mouth 2 (two) times daily.   [DISCONTINUED] desvenlafaxine (PRISTIQ) 100 MG 24 hr tablet Take 1 tablet (100 mg total) by mouth daily.   [DISCONTINUED] lisinopril (ZESTRIL) 10 MG tablet Take 2 tablets (20 mg total) by mouth daily.   ALPRAZolam (XANAX) 0.5 MG tablet TAKE ONE TAB IN AM AND ONE AND HALF AT NIGHT FOR SLEEP ( THIS IS CHANGE IN DIRECTION)   amLODipine (NORVASC) 5 MG tablet Take 1 tablet (5 mg total) by mouth daily.   buPROPion (WELLBUTRIN SR) 200 MG 12 hr tablet Take 1 tablet (200 mg total) by mouth 2 (two) times daily.   desvenlafaxine (PRISTIQ) 100 MG 24 hr tablet Take 1 tablet (100 mg total) by mouth daily.   lisinopril (ZESTRIL) 10 MG tablet Take 1 tablet (10 mg total) by mouth daily.   [DISCONTINUED] doxycycline (VIBRA-TABS) 100 MG tablet Take 1 tablet (100 mg total) by mouth 2 (two) times daily. (Patient not taking: Reported on 08/27/2020)   No facility-administered encounter medications on file as of 08/27/2020.    Surgical History: Past Surgical History:  Procedure Laterality Date   [place]   BLADDER SURGERY  2011   BREAST BIOPSY Left    benign   COLONOSCOPY WITH PROPOFOL N/A 02/01/2018   Procedure: COLONOSCOPY WITH PROPOFOL;  Surgeon: [name], MD;  Location: [facility] ENDOSCOPY;  Service: Gastroenterology;  Laterality: N/A;   LITHOTRIPSY  06/2011    Medical History: Past Medical History:  Diagnosis Date   Anxiety    Arthritis    Depression    HTN (hypertension)    Hyperlipidemia    Kidney stones 2013    Family History: Family History  Problem Relation Age of Onset   Breast cancer Mother 67   Heart attack Son     Social History   Socioeconomic History   Marital status: Married    Spouse name: Not on file   Number of children: Not on file   Years of education: Not on file   Highest education level: Not on file   Occupational History   Not on file  Tobacco Use   Smoking status: Every Day    Packs/day: 0.50    Years: 30.00    Pack years: 15.00    Types: Cigarettes   Smokeless tobacco: Never  Vaping Use   Vaping Use: Never used  Substance and Sexual Activity   Alcohol use: Yes    Comment: occassionally   Drug use: No   Sexual activity: Never  Other Topics Concern   Not on file  Social History Narrative   Not on file   Social Determinants of Health   Financial Resource Strain: Not on file  Food Insecurity: Not on file  Transportation Needs: Not on file  Physical Activity: Not on file  Stress: Not on file  Social Connections: Not on file  Intimate Partner Violence: Not on file      Review of Systems  Constitutional:  Negative for chills, fatigue and unexpected weight change.  HENT:  Negative for congestion, rhinorrhea, sneezing and sore throat.   Eyes:  Negative for redness.  Respiratory:  Negative for cough, chest tightness and shortness of breath.   Cardiovascular:  Negative for chest pain and palpitations.  Gastrointestinal:  Negative for abdominal pain, constipation, diarrhea, nausea and vomiting.  Genitourinary:  Negative for dysuria and frequency.  Musculoskeletal:  Negative for arthralgias, back pain, joint swelling and neck pain.  Skin:  Negative for rash.  Neurological: Negative.  Negative for tremors and numbness.  Hematological:  Negative for adenopathy. Does not bruise/bleed easily.  Psychiatric/Behavioral:  Negative for behavioral problems (Depression), sleep disturbance and suicidal ideas. The patient is not nervous/anxious.    Vital Signs: BP 136/64   Pulse 75   Temp (!) 97.3 F (36.3 C)   Resp 16   Ht 5' 5.5" (1.664 m)   Wt 161 lb 12.8 oz (73.4 kg)   SpO2 98%   BMI 26.52 kg/m    Physical Exam Vitals reviewed.  Constitutional:      General: She is not in acute distress.    Appearance: Normal appearance. She is well-developed. She is not  ill-appearing or diaphoretic.  HENT:     Head: Normocephalic and atraumatic.  Neck:     Thyroid: No thyromegaly.     Vascular: No JVD.     Trachea: No tracheal deviation.  Cardiovascular:     Rate and Rhythm: Normal rate and regular rhythm.  Pulmonary:     Effort: Pulmonary effort is normal. No respiratory distress.  Skin:    General: Skin is warm and dry.     Capillary Refill: Capillary refill takes less than 2 seconds.  Neurological:     Mental Status: She is alert and oriented to person, place, and time.  Psychiatric:        Mood and Affect: Mood normal.  Behavior: Behavior normal.     Assessment/Plan: 1. Generalized anxiety disorder Alprazolam dose adjusted to help her sleep better at night. Bupropion refill ordered.  - buPROPion (WELLBUTRIN SR) 200 MG 12 hr tablet; Take 1 tablet (200 mg total) by mouth 2 (two) times daily.  Dispense: 180 tablet; Refill: 3 - ALPRAZolam (XANAX) 0.5 MG tablet; TAKE ONE TAB IN AM AND ONE AND HALF AT NIGHT FOR SLEEP ( THIS IS CHANGE IN DIRECTION)  Dispense: 75 tablet; Refill: 2  2. Major depressive disorder, recurrent, moderate (HCC) Refill ordered.  - desvenlafaxine (PRISTIQ) 100 MG 24 hr tablet; Take 1 tablet (100 mg total) by mouth daily.  Dispense: 90 tablet; Refill: 1  3. Essential hypertension Refills sent.  - lisinopril (ZESTRIL) 10 MG tablet; Take 1 tablet (10 mg total) by mouth daily.  Dispense: 90 tablet; Refill: 1 - amLODipine (NORVASC) 5 MG tablet; Take 1 tablet (5 mg total) by mouth daily.  Dispense: 90 tablet; Refill: 3  4. Encounter for long-term (current) use of high-risk medication Positive for benzos which is consistent with current prescriptions.  - POCT Urine Drug Screen   General Counseling: Amanda Mcgee verbalizes understanding of the findings of todays visit and agrees with plan of treatment. I have discussed any further diagnostic evaluation that may be needed or ordered today. We also reviewed her medications today.  she has been encouraged to call the office with any questions or concerns that should arise related to todays visit.    Orders Placed This Encounter  Procedures   POCT Urine Drug Screen    Meds ordered this encounter  Medications   ALPRAZolam (XANAX) 0.5 MG tablet    Sig: TAKE ONE TAB IN AM AND ONE AND HALF AT NIGHT FOR SLEEP ( THIS IS CHANGE IN DIRECTION)    Dispense:  75 tablet    Refill:  2    Not to exceed 5 additional fills before 09/28/2020 DX Code Needed  NEED REFILL.   buPROPion (WELLBUTRIN SR) 200 MG 12 hr tablet    Sig: Take 1 tablet (200 mg total) by mouth 2 (two) times daily.    Dispense:  180 tablet    Refill:  3   lisinopril (ZESTRIL) 10 MG tablet    Sig: Take 1 tablet (10 mg total) by mouth daily.    Dispense:  90 tablet    Refill:  1   amLODipine (NORVASC) 5 MG tablet    Sig: Take 1 tablet (5 mg total) by mouth daily.    Dispense:  90 tablet    Refill:  3   desvenlafaxine (PRISTIQ) 100 MG 24 hr tablet    Sig: Take 1 tablet (100 mg total) by mouth daily.    Dispense:  90 tablet    Refill:  1    Ok to fill as 90 day prescription    Return in about 2 months (around 10/27/2020) for previously scheduled, CPE, Salome Hautala PCP.   Total time spent:30 Minutes Time spent includes review of chart, medications, test results, and follow up plan with the patient.   [place] Controlled Substance Database was reviewed by me.  This patient was seen by Jonetta Osgood, FNP-C in collaboration with Dr. Clayborn Bigness as a part of collaborative care agreement.   Maddelyn Rocca R. Valetta Fuller, MSN, FNP-C Internal medicine

## 2020-09-24 ENCOUNTER — Other Ambulatory Visit: Payer: Self-pay | Admitting: Nurse Practitioner

## 2020-09-24 DIAGNOSIS — F411 Generalized anxiety disorder: Secondary | ICD-10-CM

## 2020-10-22 LAB — COMPREHENSIVE METABOLIC PANEL
ALT: 13 IU/L (ref 0–32)
AST: 12 IU/L (ref 0–40)
Albumin/Globulin Ratio: 2 (ref 1.2–2.2)
Albumin: 4.2 g/dL (ref 3.8–4.8)
Alkaline Phosphatase: 94 IU/L (ref 44–121)
BUN/Creatinine Ratio: 17 (ref 12–28)
BUN: 14 mg/dL (ref 8–27)
Bilirubin Total: 0.3 mg/dL (ref 0.0–1.2)
CO2: 24 mmol/L (ref 20–29)
Calcium: 9.1 mg/dL (ref 8.7–10.3)
Chloride: 99 mmol/L (ref 96–106)
Creatinine, Ser: 0.81 mg/dL (ref 0.57–1.00)
Globulin, Total: 2.1 g/dL (ref 1.5–4.5)
Glucose: 124 mg/dL — ABNORMAL HIGH (ref 70–99)
Potassium: 4.3 mmol/L (ref 3.5–5.2)
Sodium: 139 mmol/L (ref 134–144)
Total Protein: 6.3 g/dL (ref 6.0–8.5)
eGFR: 80 mL/min/{1.73_m2} (ref >59)

## 2020-10-22 LAB — CBC WITH DIFFERENTIAL/PLATELET
Basophils Absolute: 0 10*3/uL (ref 0.0–0.2)
Basos: 0 %
EOS (ABSOLUTE): 0.1 10*3/uL (ref 0.0–0.4)
Eos: 1 %
Hematocrit: 41.2 % (ref 34.0–46.6)
Hemoglobin: 14.2 g/dL (ref 11.1–15.9)
Immature Grans (Abs): 0 10*3/uL (ref 0.0–0.1)
Immature Granulocytes: 0 %
Lymphocytes Absolute: 2.3 10*3/uL (ref 0.7–3.1)
Lymphs: 30 %
MCH: 31.5 pg (ref 26.6–33.0)
MCHC: 34.5 g/dL (ref 31.5–35.7)
MCV: 91 fL (ref 79–97)
Monocytes Absolute: 0.7 10*3/uL (ref 0.1–0.9)
Monocytes: 9 %
Neutrophils Absolute: 4.6 10*3/uL (ref 1.4–7.0)
Neutrophils: 60 %
Platelets: 198 10*3/uL (ref 150–450)
RBC: 4.51 x10E6/uL (ref 3.77–5.28)
RDW: 12 % (ref 11.7–15.4)
WBC: 7.7 10*3/uL (ref 3.4–10.8)

## 2020-10-22 LAB — LIPID PANEL WITH LDL/HDL RATIO
Cholesterol, Total: 236 mg/dL — ABNORMAL HIGH (ref 100–199)
HDL: 49 mg/dL (ref >39)
LDL Chol Calc (NIH): 168 mg/dL — ABNORMAL HIGH (ref 0–99)
LDL/HDL Ratio: 3.4 ratio — ABNORMAL HIGH (ref 0.0–3.2)
Triglycerides: 104 mg/dL (ref 0–149)
VLDL Cholesterol Cal: 19 mg/dL (ref 5–40)

## 2020-10-22 LAB — VITAMIN D 25 HYDROXY (VIT D DEFICIENCY, FRACTURES): Vit D, 25-Hydroxy: 32.9 ng/mL (ref 30.0–100.0)

## 2020-10-22 LAB — TSH+FREE T4
Free T4: 0.76 ng/dL — ABNORMAL LOW (ref 0.82–1.77)
TSH: 2.23 u[IU]/mL (ref 0.450–4.500)

## 2020-10-26 ENCOUNTER — Other Ambulatory Visit: Payer: Self-pay | Admitting: Nurse Practitioner

## 2020-10-26 DIAGNOSIS — J4520 Mild intermittent asthma, uncomplicated: Secondary | ICD-10-CM

## 2020-10-28 ENCOUNTER — Ambulatory Visit: Payer: Medicare Other | Admitting: Nurse Practitioner

## 2020-11-05 ENCOUNTER — Other Ambulatory Visit: Payer: Self-pay | Admitting: Nurse Practitioner

## 2020-11-05 DIAGNOSIS — B372 Candidiasis of skin and nail: Secondary | ICD-10-CM

## 2020-11-11 ENCOUNTER — Ambulatory Visit (INDEPENDENT_AMBULATORY_CARE_PROVIDER_SITE_OTHER): Payer: Medicare Other | Admitting: Nurse Practitioner

## 2020-11-11 ENCOUNTER — Encounter: Payer: Self-pay | Admitting: Nurse Practitioner

## 2020-11-11 ENCOUNTER — Other Ambulatory Visit: Payer: Self-pay

## 2020-11-11 VITALS — BP 139/65 | HR 71 | Temp 98.2°F | Resp 16 | Ht 65.5 in | Wt 165.6 lb

## 2020-11-11 DIAGNOSIS — J3089 Other allergic rhinitis: Secondary | ICD-10-CM

## 2020-11-11 DIAGNOSIS — R42 Dizziness and giddiness: Secondary | ICD-10-CM

## 2020-11-11 DIAGNOSIS — Z789 Other specified health status: Secondary | ICD-10-CM | POA: Diagnosis not present

## 2020-11-11 DIAGNOSIS — E782 Mixed hyperlipidemia: Secondary | ICD-10-CM | POA: Diagnosis not present

## 2020-11-11 DIAGNOSIS — F331 Major depressive disorder, recurrent, moderate: Secondary | ICD-10-CM

## 2020-11-11 DIAGNOSIS — J4520 Mild intermittent asthma, uncomplicated: Secondary | ICD-10-CM | POA: Diagnosis not present

## 2020-11-11 DIAGNOSIS — B372 Candidiasis of skin and nail: Secondary | ICD-10-CM

## 2020-11-11 MED ORDER — LEVOCETIRIZINE DIHYDROCHLORIDE 5 MG PO TABS: 5.0000 mg | ORAL_TABLET | Freq: Every day | ORAL | 1 refills | Status: DC

## 2020-11-11 MED ORDER — ALBUTEROL SULFATE HFA 108 (90 BASE) MCG/ACT IN AERS: 2.0000 | INHALATION_SPRAY | Freq: Four times a day (QID) | RESPIRATORY_TRACT | 1 refills | Status: DC | PRN

## 2020-11-11 MED ORDER — CLOTRIMAZOLE-BETAMETHASONE 1-0.05 % EX CREA: 1.0000 "application " | TOPICAL_CREAM | Freq: Two times a day (BID) | CUTANEOUS | 1 refills | Status: DC

## 2020-11-11 MED ORDER — MECLIZINE HCL 25 MG PO TABS: ORAL_TABLET | ORAL | 1 refills | Status: DC

## 2020-11-11 MED ORDER — ALPRAZOLAM 0.5 MG PO TABS: ORAL_TABLET | ORAL | 2 refills | Status: DC

## 2020-11-11 NOTE — Progress Notes (Signed)
[facility] [place], [place] [address]  Internal MEDICINE  Office Visit Note  Patient Name: Amanda Mcgee  [id number]  [id number]  Date of Service: 12/07/2020  Chief Complaint  Patient presents with   Follow-up    refills   Depression   Hyperlipidemia   Hypertension   Anxiety    HPI Amanda Mcgee presents for a follow up visit for medication refills. She was scheduled for her annual physical earlier this month but had woken up with intense vertigo that day and cancelled her appointment. She is rescheduled for her annual physical in December. She had her routine labs drawn on 10/21/20. Her CBC and metabolic panel are wnl except for elevated glucose level. Her lipids are elevated and statin therapy is recommended but patient has had intolerance to statin in the past.  Her TSH is normal, free T4 is slightly low.  Her blood pressure is well controlled and her anxiety level is low.      Current Medication: Outpatient Encounter Medications as of 11/11/2020  Medication Sig   amLODipine (NORVASC) 5 MG tablet Take 1 tablet (5 mg total) by mouth daily.   buPROPion (WELLBUTRIN SR) 200 MG 12 hr tablet TAKE 1 TABLET BY MOUTH TWICE A DAY   calcium carbonate (OS-CAL) 600 MG TABS tablet Take 600 mg by mouth 2 (two) times daily with a meal.   Cholecalciferol (VITAMIN D3) 400 units CAPS Take by mouth daily.   CRANBERRY PO Take 1 tablet by mouth 2 (two) times daily.   desvenlafaxine (PRISTIQ) 100 MG 24 hr tablet Take 1 tablet (100 mg total) by mouth daily.   docusate sodium (COLACE) 100 MG capsule Take 100 mg by mouth 2 (two) times daily.   fluticasone (FLONASE) 50 MCG/ACT nasal spray Place 1 spray into both nostrils daily.   lisinopril (ZESTRIL) 10 MG tablet Take 1 tablet (10 mg total) by mouth daily.   [DISCONTINUED] albuterol (VENTOLIN HFA) 108 (90 Base) MCG/ACT inhaler Inhale 2 puffs into the lungs every 6 (six) hours as needed for wheezing or shortness of breath.    [DISCONTINUED] ALPRAZolam (XANAX) 0.5 MG tablet TAKE ONE TAB IN AM AND ONE AND HALF AT NIGHT FOR SLEEP ( THIS IS CHANGE IN DIRECTION)   [DISCONTINUED] clotrimazole-betamethasone (LOTRISONE) cream Apply 1 application topically 2 (two) times daily.   [DISCONTINUED] levocetirizine (XYZAL) 5 MG tablet Take 1 tablet (5 mg total) by mouth daily.   [DISCONTINUED] meclizine (ANTIVERT) 25 MG tablet Take twice daily as needed for dizziness   albuterol (VENTOLIN HFA) 108 (90 Base) MCG/ACT inhaler Inhale 2 puffs into the lungs every 6 (six) hours as needed for wheezing or shortness of breath.   ALPRAZolam (XANAX) 0.5 MG tablet TAKE ONE TAB IN AM AND ONE AND HALF AT NIGHT FOR SLEEP ( THIS IS CHANGE IN DIRECTION)   clotrimazole-betamethasone (LOTRISONE) cream Apply 1 application topically 2 (two) times daily.   levocetirizine (XYZAL) 5 MG tablet Take 1 tablet (5 mg total) by mouth daily.   meclizine (ANTIVERT) 25 MG tablet Take twice daily as needed for dizziness   No facility-administered encounter medications on file as of 11/11/2020.    Surgical History: Past Surgical History:  Procedure Laterality Date   [place]   BLADDER SURGERY  2011   BREAST BIOPSY Left    benign   COLONOSCOPY WITH PROPOFOL N/A 02/01/2018   Procedure: COLONOSCOPY WITH PROPOFOL;  Surgeon: [name], MD;  Location: [facility];  Service: Gastroenterology;  Laterality: N/A;   LITHOTRIPSY  06/2011    Medical History: Past Medical History:  Diagnosis Date   Anxiety    Arthritis    Depression    HTN (hypertension)    Hyperlipidemia    Kidney stones 2013    Family History: Family History  Problem Relation Age of Onset   Breast cancer Mother 72   Heart attack Son     Social History   Socioeconomic History   Marital status: Married    Spouse name: Not on file   Number of children: Not on file   Years of education: Not on file   Highest education level: Not on file   Occupational History   Not on file  Tobacco Use   Smoking status: Every Day    Packs/day: 0.50    Years: 30.00    Pack years: 15.00    Types: Cigarettes   Smokeless tobacco: Never  Vaping Use   Vaping Use: Never used  Substance and Sexual Activity   Alcohol use: Yes    Comment: occassionally   Drug use: No   Sexual activity: Never  Other Topics Concern   Not on file  Social History Narrative   Not on file   Social Determinants of Health   Financial Resource Strain: Not on file  Food Insecurity: Not on file  Transportation Needs: Not on file  Physical Activity: Not on file  Stress: Not on file  Social Connections: Not on file  Intimate Partner Violence: Not on file      Review of Systems  Constitutional:  Negative for chills, fatigue and unexpected weight change.  HENT:  Negative for congestion, rhinorrhea, sneezing and sore throat.   Eyes:  Negative for redness.  Respiratory:  Negative for cough, chest tightness and shortness of breath.   Cardiovascular:  Negative for chest pain and palpitations.  Gastrointestinal:  Negative for abdominal pain, constipation, diarrhea, nausea and vomiting.  Genitourinary:  Negative for dysuria and frequency.  Musculoskeletal:  Negative for arthralgias, back pain, joint swelling and neck pain.  Skin:  Negative for rash.  Neurological: Negative.  Negative for tremors and numbness.  Hematological:  Negative for adenopathy. Does not bruise/bleed easily.  Psychiatric/Behavioral:  Negative for behavioral problems (Depression), sleep disturbance and suicidal ideas. The patient is not nervous/anxious.    Vital Signs: BP 139/65   Pulse 71   Temp 98.2 F (36.8 C)   Resp 16   Ht 5' 5.5" (1.664 m)   Wt 165 lb 9.6 oz (75.1 kg)   SpO2 98%   BMI 27.14 kg/m    Physical Exam Vitals reviewed.  Constitutional:      General: She is not in acute distress.    Appearance: Normal appearance. She is well-developed, well-groomed and  overweight. She is not ill-appearing.  HENT:     Head: Normocephalic and atraumatic.  Eyes:     Extraocular Movements: Extraocular movements intact.     Pupils: Pupils are equal, round, and reactive to light.  Cardiovascular:     Rate and Rhythm: Normal rate and regular rhythm.  Pulmonary:     Effort: Pulmonary effort is normal. No respiratory distress.  Neurological:     Mental Status: She is alert and oriented to person, place, and time.     Cranial Nerves: No cranial nerve deficit.     Coordination: Coordination normal.     Gait: Gait normal.  Psychiatric:        Mood and Affect:  Mood normal.        Behavior: Behavior normal. Behavior is cooperative.       Assessment/Plan: 1. Mild intermittent asthma without complication Stable, doing well, like to have an unexpired rescue inhaler on hand, needs refill ordered.  - albuterol (VENTOLIN HFA) 108 (90 Base) MCG/ACT inhaler; Inhale 2 puffs into the lungs every 6 (six) hours as needed for wheezing or shortness of breath.  Dispense: 18 g; Refill: 1  2. Mixed hyperlipidemia Lipid panel is elevated, discussed starting fenofibrate to lower cholesterol. She declined and asked to discuss again in December at her annual wellness visit.   3. Vertigo Had severe vertigo earlier this month, meclizine helped, refill ordered - meclizine (ANTIVERT) 25 MG tablet; Take twice daily as needed for dizziness  Dispense: 30 tablet; Refill: 1  4. Statin intolerance Has been on statins previously, had severe myopathy.   5. Cutaneous candidiasis Has run out and has yeast infection that comes off and on under the breast, likes to have lotrisone on hand, refill ordered.  - clotrimazole-betamethasone (LOTRISONE) cream; Apply 1 application topically 2 (two) times daily.  Dispense: 45 g; Refill: 1  6. Non-seasonal allergic rhinitis due to other allergic trigger Doing well with xyzal, refill ordered - levocetirizine (XYZAL) 5 MG tablet; Take 1 tablet (5 mg  total) by mouth daily.  Dispense: 90 tablet; Refill: 1  7. Major depressive disorder, recurrent, moderate (HCC) UDS was done in august. Refill ordered today.  - ALPRAZolam (XANAX) 0.5 MG tablet; TAKE ONE TAB IN AM AND ONE AND HALF AT NIGHT FOR SLEEP ( THIS IS CHANGE IN DIRECTION)  Dispense: 75 tablet; Refill: 2   General Counseling: Liyah verbalizes understanding of the findings of todays visit and agrees with plan of treatment. I have discussed any further diagnostic evaluation that may be needed or ordered today. We also reviewed her medications today. she has been encouraged to call the office with any questions or concerns that should arise related to todays visit.    No orders of the defined types were placed in this encounter.   Meds ordered this encounter  Medications   ALPRAZolam (XANAX) 0.5 MG tablet    Sig: TAKE ONE TAB IN AM AND ONE AND HALF AT NIGHT FOR SLEEP ( THIS IS CHANGE IN DIRECTION)    Dispense:  75 tablet    Refill:  2    Not to exceed 5 additional fills before 09/28/2020 DX Code Needed  NEED REFILL.   clotrimazole-betamethasone (LOTRISONE) cream    Sig: Apply 1 application topically 2 (two) times daily.    Dispense:  45 g    Refill:  1   meclizine (ANTIVERT) 25 MG tablet    Sig: Take twice daily as needed for dizziness    Dispense:  30 tablet    Refill:  1   levocetirizine (XYZAL) 5 MG tablet    Sig: Take 1 tablet (5 mg total) by mouth daily.    Dispense:  90 tablet    Refill:  1   albuterol (VENTOLIN HFA) 108 (90 Base) MCG/ACT inhaler    Sig: Inhale 2 puffs into the lungs every 6 (six) hours as needed for wheezing or shortness of breath.    Dispense:  18 g    Refill:  1    Return in about 3 months (around 02/11/2021) for F/U, med refill, Demerius Podolak PCP.   Total time spent:30 Minutes Time spent includes review of chart, medications, test results, and follow up plan with the  patient.   [place] Controlled Substance Database was reviewed by me.  This patient  was seen by Jonetta Osgood, FNP-C in collaboration with Dr. Clayborn Bigness as a part of collaborative care agreement.   Dhana Totton R. Valetta Fuller, MSN, FNP-C Internal medicine

## 2020-12-07 ENCOUNTER — Encounter: Payer: Self-pay | Admitting: Nurse Practitioner

## 2020-12-31 ENCOUNTER — Encounter: Payer: Self-pay | Admitting: Nurse Practitioner

## 2020-12-31 ENCOUNTER — Other Ambulatory Visit: Payer: Self-pay

## 2020-12-31 ENCOUNTER — Ambulatory Visit (INDEPENDENT_AMBULATORY_CARE_PROVIDER_SITE_OTHER): Payer: Medicare Other | Admitting: Nurse Practitioner

## 2020-12-31 VITALS — BP 126/90 | HR 80 | Temp 98.2°F | Resp 16 | Ht 65.5 in | Wt 164.2 lb

## 2020-12-31 DIAGNOSIS — I10 Essential (primary) hypertension: Secondary | ICD-10-CM

## 2020-12-31 DIAGNOSIS — F411 Generalized anxiety disorder: Secondary | ICD-10-CM | POA: Diagnosis not present

## 2020-12-31 DIAGNOSIS — Z01419 Encounter for gynecological examination (general) (routine) without abnormal findings: Secondary | ICD-10-CM

## 2020-12-31 DIAGNOSIS — F331 Major depressive disorder, recurrent, moderate: Secondary | ICD-10-CM | POA: Diagnosis not present

## 2020-12-31 DIAGNOSIS — Z1231 Encounter for screening mammogram for malignant neoplasm of breast: Secondary | ICD-10-CM

## 2020-12-31 DIAGNOSIS — J3089 Other allergic rhinitis: Secondary | ICD-10-CM

## 2020-12-31 DIAGNOSIS — Z0001 Encounter for general adult medical examination with abnormal findings: Secondary | ICD-10-CM | POA: Diagnosis not present

## 2020-12-31 DIAGNOSIS — R300 Dysuria: Secondary | ICD-10-CM

## 2020-12-31 MED ORDER — LISINOPRIL 10 MG PO TABS: 10.0000 mg | ORAL_TABLET | Freq: Every day | ORAL | 1 refills | Status: DC

## 2020-12-31 MED ORDER — LEVOCETIRIZINE DIHYDROCHLORIDE 5 MG PO TABS: 5.0000 mg | ORAL_TABLET | Freq: Every day | ORAL | 1 refills | Status: DC

## 2020-12-31 MED ORDER — BUPROPION HCL ER (SR) 200 MG PO TB12
200.0000 mg | ORAL_TABLET | Freq: Two times a day (BID) | ORAL | 1 refills | Status: DC
Start: 2020-12-31 — End: 2021-04-01

## 2020-12-31 MED ORDER — ALPRAZOLAM 0.5 MG PO TABS: ORAL_TABLET | ORAL | 2 refills | Status: DC

## 2020-12-31 MED ORDER — DESVENLAFAXINE SUCCINATE ER 100 MG PO TB24: 100.0000 mg | ORAL_TABLET | Freq: Every day | ORAL | 1 refills | Status: DC

## 2020-12-31 NOTE — Progress Notes (Signed)
[facility] [place], [place] [address]  Internal MEDICINE  Office Visit Note  Patient Name: Amanda Mcgee  [id number]  [id number]  Date of Service: 12/31/2020  Chief Complaint  Patient presents with   Medicare Wellness   Hyperlipidemia   Hypertension   Depression   Anxiety   Medication Refill    HPI Amanda Mcgee presents for an annual well visit and physical exam.  She is a well-appearing 65 year old female with arthritis of the left shoulder.  Her arthritis is managed with over-the-counter treatments and medications including Biofreeze.  She has chronic problems including hypertension allergic rhinitis, mild intermittent asthma, anxiety.  She has a family history of breast cancer so she continues to get yearly mammograms as well as clinical breast exams in the office.  She is due for her colonoscopy as of January next year and her yearly mammogram is due in February 2023.  She had her BMD screening in November 2021 and it showed some osteopenia.   Current Medication: Outpatient Encounter Medications as of 12/31/2020  Medication Sig   albuterol (VENTOLIN HFA) 108 (90 Base) MCG/ACT inhaler Inhale 2 puffs into the lungs every 6 (six) hours as needed for wheezing or shortness of breath.   amLODipine (NORVASC) 5 MG tablet Take 1 tablet (5 mg total) by mouth daily.   calcium carbonate (OS-CAL) 600 MG TABS tablet Take 600 mg by mouth 2 (two) times daily with a meal.   Cholecalciferol (VITAMIN D3) 400 units CAPS Take by mouth daily.   clotrimazole-betamethasone (LOTRISONE) cream Apply 1 application topically 2 (two) times daily.   CRANBERRY PO Take 1 tablet by mouth 2 (two) times daily.   docusate sodium (COLACE) 100 MG capsule Take 100 mg by mouth 2 (two) times daily.   fluticasone (FLONASE) 50 MCG/ACT nasal spray Place 1 spray into both nostrils daily.   meclizine (ANTIVERT) 25 MG tablet Take twice daily as needed for dizziness   [DISCONTINUED] ALPRAZolam (XANAX)  0.5 MG tablet TAKE ONE TAB IN AM AND ONE AND HALF AT NIGHT FOR SLEEP ( THIS IS CHANGE IN DIRECTION)   [DISCONTINUED] buPROPion (WELLBUTRIN SR) 200 MG 12 hr tablet TAKE 1 TABLET BY MOUTH TWICE A DAY   [DISCONTINUED] desvenlafaxine (PRISTIQ) 100 MG 24 hr tablet Take 1 tablet (100 mg total) by mouth daily.   [DISCONTINUED] levocetirizine (XYZAL) 5 MG tablet Take 1 tablet (5 mg total) by mouth daily.   [DISCONTINUED] lisinopril (ZESTRIL) 10 MG tablet Take 1 tablet (10 mg total) by mouth daily.   ALPRAZolam (XANAX) 0.5 MG tablet TAKE ONE TAB IN AM AND ONE AND HALF AT NIGHT FOR SLEEP ( THIS IS CHANGE IN DIRECTION)   buPROPion (WELLBUTRIN SR) 200 MG 12 hr tablet Take 1 tablet (200 mg total) by mouth 2 (two) times daily.   desvenlafaxine (PRISTIQ) 100 MG 24 hr tablet Take 1 tablet (100 mg total) by mouth daily.   levocetirizine (XYZAL) 5 MG tablet Take 1 tablet (5 mg total) by mouth daily.   lisinopril (ZESTRIL) 10 MG tablet Take 1 tablet (10 mg total) by mouth daily.   No facility-administered encounter medications on file as of 12/31/2020.    Surgical History: Past Surgical History:  Procedure Laterality Date   [place]   BLADDER SURGERY  2011   BREAST BIOPSY Left    benign   COLONOSCOPY WITH PROPOFOL N/A 02/01/2018   Procedure: COLONOSCOPY WITH PROPOFOL;  Surgeon: [name], MD;  Location:  [facility];  Service: Gastroenterology;  Laterality: N/A;   LITHOTRIPSY  06/2011    Medical History: Past Medical History:  Diagnosis Date   Anxiety    Arthritis    Depression    HTN (hypertension)    Hyperlipidemia    Kidney stones 2013    Family History: Family History  Problem Relation Age of Onset   Breast cancer Mother 36   Heart attack Son     Social History   Socioeconomic History   Marital status: Married    Spouse name: Not on file   Number of children: Not on file   Years of education: Not on file   Highest education level: Not  on file  Occupational History   Not on file  Tobacco Use   Smoking status: Every Day    Packs/day: 0.50    Years: 30.00    Pack years: 15.00    Types: Cigarettes   Smokeless tobacco: Never  Vaping Use   Vaping Use: Never used  Substance and Sexual Activity   Alcohol use: Yes    Comment: occassionally   Drug use: No   Sexual activity: Never  Other Topics Concern   Not on file  Social History Narrative   Not on file   Social Determinants of Health   Financial Resource Strain: Not on file  Food Insecurity: Not on file  Transportation Needs: Not on file  Physical Activity: Not on file  Stress: Not on file  Social Connections: Not on file  Intimate Partner Violence: Not on file      Review of Systems  Constitutional:  Negative for activity change, appetite change, chills, fatigue, fever and unexpected weight change.  HENT: Negative.  Negative for congestion, ear pain, rhinorrhea, sore throat and trouble swallowing.   Eyes: Negative.   Respiratory: Negative.  Negative for cough, chest tightness, shortness of breath and wheezing.   Cardiovascular: Negative.  Negative for chest pain.  Gastrointestinal: Negative.  Negative for abdominal pain, blood in stool, constipation, diarrhea, nausea and vomiting.  Endocrine: Negative.   Genitourinary: Negative.  Negative for difficulty urinating, dysuria, frequency, hematuria and urgency.  Musculoskeletal: Negative.  Negative for arthralgias, back pain, joint swelling, myalgias and neck pain.  Skin: Negative.  Negative for rash and wound.  Allergic/Immunologic: Negative.  Negative for immunocompromised state.  Neurological: Negative.  Negative for dizziness, seizures, numbness and headaches.  Hematological: Negative.   Psychiatric/Behavioral: Negative.  Negative for behavioral problems, self-injury and suicidal ideas. The patient is not nervous/anxious.    Vital Signs: BP 126/90    Pulse 80    Temp 98.2 F (36.8 C)    Resp 16    Ht  5' 5.5" (1.664 m)    Wt 164 lb 3.2 oz (74.5 kg)    SpO2 99%    BMI 26.91 kg/m    Physical Exam Vitals reviewed.  Constitutional:      General: She is awake. She is not in acute distress.    Appearance: Normal appearance. She is well-developed, well-groomed and normal weight. She is not ill-appearing or diaphoretic.  HENT:     Head: Normocephalic and atraumatic.     Right Ear: Tympanic membrane, ear canal and external ear normal.     Left Ear: Tympanic membrane, ear canal and external ear normal.     Nose: Nose normal. No congestion or rhinorrhea.     Mouth/Throat:     Lips: Pink.     Mouth: Mucous membranes are moist.  Pharynx: Oropharynx is clear. Uvula midline. No oropharyngeal exudate or posterior oropharyngeal erythema.  Eyes:     General: Lids are normal. Vision grossly intact. Gaze aligned appropriately. No scleral icterus.       Right eye: No discharge.        Left eye: No discharge.     Extraocular Movements: Extraocular movements intact.     Conjunctiva/sclera: Conjunctivae normal.     Pupils: Pupils are equal, round, and reactive to light.     Funduscopic exam:    Right eye: Red reflex present.        Left eye: Red reflex present. Neck:     Thyroid: No thyromegaly.     Vascular: No JVD.     Trachea: No tracheal deviation.  Cardiovascular:     Rate and Rhythm: Normal rate and regular rhythm.     Pulses: Normal pulses.     Heart sounds: Normal heart sounds, S1 normal and S2 normal. No murmur heard.   No friction rub. No gallop.  Pulmonary:     Effort: No accessory muscle usage or respiratory distress.     Breath sounds: Normal breath sounds and air entry. No stridor. No wheezing or rales.  Chest:     Chest wall: No tenderness.  Breasts:    Right: Normal. No swelling, bleeding, inverted nipple, mass, nipple discharge, skin change or tenderness.     Left: Normal. No swelling, bleeding, inverted nipple, mass, nipple discharge, skin change or tenderness.   Abdominal:     General: Bowel sounds are normal. There is no distension.     Palpations: Abdomen is soft. There is no shifting dullness, fluid wave, mass or pulsatile mass.     Tenderness: There is no abdominal tenderness. There is no guarding or rebound.  Musculoskeletal:        General: No tenderness or deformity. Normal range of motion.     Cervical back: Normal range of motion and neck supple.     Right lower leg: No edema.     Left lower leg: No edema.  Lymphadenopathy:     Cervical: No cervical adenopathy.     Upper Body:     Right upper body: No supraclavicular, axillary or pectoral adenopathy.     Left upper body: No supraclavicular, axillary or pectoral adenopathy.  Skin:    General: Skin is warm and dry.     Capillary Refill: Capillary refill takes less than 2 seconds.     Coloration: Skin is not pale.     Findings: No erythema or rash.  Neurological:     Mental Status: She is alert and oriented to person, place, and time.     Cranial Nerves: No cranial nerve deficit.     Motor: No abnormal muscle tone.     Coordination: Coordination normal.     Gait: Gait normal.     Deep Tendon Reflexes: Reflexes are normal and symmetric.  Psychiatric:        Mood and Affect: Mood and affect normal.        Behavior: Behavior normal. Behavior is cooperative.        Thought Content: Thought content normal.        Judgment: Judgment normal.       Assessment/Plan: 1. Encounter for general adult medical examination with abnormal findings Age-appropriate preventive screenings and vaccinations discussed, annual physical exam completed. Routine labs for health maintenance were drawn in October and discussed at a previous office visit. PHM updated.  2. Essential hypertension Blood pressure is stable continue current medication, refills ordered.  - lisinopril (ZESTRIL) 10 MG tablet; Take 1 tablet (10 mg total) by mouth daily.  Dispense: 90 tablet; Refill: 1  3. Non-seasonal allergic  rhinitis due to other allergic trigger Stable, refills ordered.  - levocetirizine (XYZAL) 5 MG tablet; Take 1 tablet (5 mg total) by mouth daily.  Dispense: 90 tablet; Refill: 1  4. Major depressive disorder, recurrent, moderate (HCC) Continue pristiq and wellbutrin as prescribed, refills ordered, no changes.  - desvenlafaxine (PRISTIQ) 100 MG 24 hr tablet; Take 1 tablet (100 mg total) by mouth daily.  Dispense: 90 tablet; Refill: 1 - buPROPion (WELLBUTRIN SR) 200 MG 12 hr tablet; Take 1 tablet (200 mg total) by mouth 2 (two) times daily.  Dispense: 180 tablet; Refill: 1  5. Generalized anxiety disorder Continue wellbutrin and alprazolam as prescribed, no changes.  - buPROPion (WELLBUTRIN SR) 200 MG 12 hr tablet; Take 1 tablet (200 mg total) by mouth 2 (two) times daily.  Dispense: 180 tablet; Refill: 1 - ALPRAZolam (XANAX) 0.5 MG tablet; TAKE ONE TAB IN AM AND ONE AND HALF AT NIGHT FOR SLEEP ( THIS IS CHANGE IN DIRECTION)  Dispense: 75 tablet; Refill: 2  6. Dysuria Routine urinalysis done - UA/M w/rflx Culture, Routine  7. Encounter for gynecological examination without abnormal finding Clinical breast exam done in office today, was normal.   8. Encounter for screening mammogram for malignant neoplasm of breast Routine mammogram ordered.  - MM 3D SCREEN BREAST BILATERAL; Future      General Counseling: Amanda Mcgee understanding of the findings of todays visit and agrees with plan of treatment. I have discussed any further diagnostic evaluation that may be needed or ordered today. We also reviewed her medications today. she has been encouraged to call the office with any questions or concerns that should arise related to todays visit.    Orders Placed This Encounter  Procedures   UA/M w/rflx Culture, Routine    Meds ordered this encounter  Medications   ALPRAZolam (XANAX) 0.5 MG tablet    Sig: TAKE ONE TAB IN AM AND ONE AND HALF AT NIGHT FOR SLEEP ( THIS IS CHANGE IN  DIRECTION)    Dispense:  75 tablet    Refill:  2    F41.9   levocetirizine (XYZAL) 5 MG tablet    Sig: Take 1 tablet (5 mg total) by mouth daily.    Dispense:  90 tablet    Refill:  1   desvenlafaxine (PRISTIQ) 100 MG 24 hr tablet    Sig: Take 1 tablet (100 mg total) by mouth daily.    Dispense:  90 tablet    Refill:  1    Ok to fill as 90 day prescription   lisinopril (ZESTRIL) 10 MG tablet    Sig: Take 1 tablet (10 mg total) by mouth daily.    Dispense:  90 tablet    Refill:  1   buPROPion (WELLBUTRIN SR) 200 MG 12 hr tablet    Sig: Take 1 tablet (200 mg total) by mouth 2 (two) times daily.    Dispense:  180 tablet    Refill:  1    Return in about 3 months (around 03/31/2021) for F/U, med refill, Amanda Mcgee PCP.   Total time spent:30 Minutes Time spent includes review of chart, medications, test results, and follow up plan with the patient.   [place] Controlled Substance Database was reviewed by me.  This patient was seen  by Jonetta Osgood, FNP-C in collaboration with Dr. Clayborn Bigness as a part of collaborative care agreement.  Tavious Griesinger R. Valetta Fuller, MSN, FNP-C Internal medicine

## 2021-01-01 LAB — UA/M W/RFLX CULTURE, ROUTINE
Bilirubin, UA: NEGATIVE
Glucose, UA: NEGATIVE
Ketones, UA: NEGATIVE
Leukocytes,UA: NEGATIVE
Nitrite, UA: NEGATIVE
Protein,UA: NEGATIVE
Specific Gravity, UA: 1.005 (ref 1.005–1.030)
Urobilinogen, Ur: 0.2 mg/dL (ref 0.2–1.0)
pH, UA: 6.5 (ref 5.0–7.5)

## 2021-01-01 LAB — MICROSCOPIC EXAMINATION
Bacteria, UA: NONE SEEN
Casts: NONE SEEN /lpf
Epithelial Cells (non renal): NONE SEEN /hpf (ref 0–10)
WBC, UA: NONE SEEN /hpf (ref 0–5)

## 2021-03-25 ENCOUNTER — Other Ambulatory Visit: Payer: Self-pay

## 2021-03-25 ENCOUNTER — Ambulatory Visit
Admission: RE | Admit: 2021-03-25 | Discharge: 2021-03-25 | Disposition: A | Discharge status: Home / Self Care | Payer: Medicare Other | Source: Ambulatory Visit | Attending: Nurse Practitioner | Admitting: Nurse Practitioner

## 2021-03-25 DIAGNOSIS — Z1231 Encounter for screening mammogram for malignant neoplasm of breast: Secondary | ICD-10-CM | POA: Insufficient documentation

## 2021-04-01 ENCOUNTER — Encounter: Payer: Self-pay | Admitting: Nurse Practitioner

## 2021-04-01 ENCOUNTER — Ambulatory Visit (INDEPENDENT_AMBULATORY_CARE_PROVIDER_SITE_OTHER): Payer: Medicare Other | Admitting: Nurse Practitioner

## 2021-04-01 VITALS — BP 128/82 | HR 88 | Temp 98.2°F | Resp 16 | Ht 64.0 in | Wt 169.6 lb

## 2021-04-01 DIAGNOSIS — F331 Major depressive disorder, recurrent, moderate: Secondary | ICD-10-CM

## 2021-04-01 DIAGNOSIS — Z79899 Other long term (current) drug therapy: Secondary | ICD-10-CM

## 2021-04-01 DIAGNOSIS — I10 Essential (primary) hypertension: Secondary | ICD-10-CM

## 2021-04-01 DIAGNOSIS — F411 Generalized anxiety disorder: Secondary | ICD-10-CM | POA: Diagnosis not present

## 2021-04-01 LAB — POCT URINE DRUG SCREEN
Methylenedioxyamphetamine: NOT DETECTED
POC Amphetamine UR: NOT DETECTED
POC BENZODIAZEPINES UR: POSITIVE — AB
POC Barbiturate UR: NOT DETECTED
POC Cocaine UR: NOT DETECTED
POC Ecstasy UR: NOT DETECTED
POC Marijuana UR: NOT DETECTED
POC Methadone UR: NOT DETECTED
POC Methamphetamine UR: NOT DETECTED
POC Opiate Ur: NOT DETECTED
POC Oxycodone UR: NOT DETECTED
POC PHENCYCLIDINE UR: NOT DETECTED
POC TRICYCLICS UR: NOT DETECTED

## 2021-04-01 MED ORDER — ALPRAZOLAM 0.5 MG PO TABS: ORAL_TABLET | ORAL | 2 refills | Status: DC

## 2021-04-01 MED ORDER — LISINOPRIL 10 MG PO TABS: 10.0000 mg | ORAL_TABLET | Freq: Every day | ORAL | 1 refills | Status: DC

## 2021-04-01 MED ORDER — BUPROPION HCL ER (SR) 200 MG PO TB12: 200.0000 mg | ORAL_TABLET | Freq: Two times a day (BID) | ORAL | 1 refills | Status: DC

## 2021-04-01 MED ORDER — DESVENLAFAXINE SUCCINATE ER 100 MG PO TB24: 100.0000 mg | ORAL_TABLET | Freq: Every day | ORAL | 1 refills | Status: DC

## 2021-04-01 NOTE — Progress Notes (Signed)
[facility] ?[address] ?[place], [place] [address] ? ?Internal MEDICINE  ?Office Visit Note ? ?Patient Name: Amanda Mcgee ? [id number]  ?[id number] ? ?Date of Service: 04/01/2021 ? ?Chief Complaint  ?Patient presents with  ? Follow-up  ?  Discuss future blood work  ? Depression  ? Hyperlipidemia  ? Hypertension  ? Anxiety  ? Medication Refill  ? ? ?HPI ?Ciin presents for follow-up visit for hypertension, anxiety and depression.  Patient had her annual lab work done in October which was reviewed with her and her labs were grossly normal.  She is due for refills of alprazolam.  She recently had her mammogram and the result was BI-RADS Category 1 negative which was discussed with the patient today.  Her blood pressure is stable with current medications.  She is also asking if she can take over-the-counter Azo though less due to issues with urinary incontinence, stress incontinence.  She reports that she was previously prescribed something for overactive bladder to help with this issue in the past. ? ? ? ?Current Medication: ?Outpatient Encounter Medications as of 04/01/2021  ?Medication Sig  ? albuterol (VENTOLIN HFA) 108 (90 Base) MCG/ACT inhaler Inhale 2 puffs into the lungs every 6 (six) hours as needed for wheezing or shortness of breath.  ? amLODipine (NORVASC) 5 MG tablet Take 1 tablet (5 mg total) by mouth daily.  ? calcium carbonate (OS-CAL) 600 MG TABS tablet Take 600 mg by mouth 2 (two) times daily with a meal.  ? Cholecalciferol (VITAMIN D3) 400 units CAPS Take by mouth daily.  ? clotrimazole-betamethasone (LOTRISONE) cream Apply 1 application topically 2 (two) times daily.  ? CRANBERRY PO Take 1 tablet by mouth 2 (two) times daily.  ? docusate sodium (COLACE) 100 MG capsule Take 100 mg by mouth 2 (two) times daily.  ? fluticasone (FLONASE) 50 MCG/ACT nasal spray Place 1 spray into both nostrils daily.  ? levocetirizine (XYZAL) 5 MG tablet Take 1 tablet (5 mg total) by mouth daily.  ?  meclizine (ANTIVERT) 25 MG tablet Take twice daily as needed for dizziness  ? [DISCONTINUED] ALPRAZolam (XANAX) 0.5 MG tablet TAKE ONE TAB IN AM AND ONE AND HALF AT NIGHT FOR SLEEP ( THIS IS CHANGE IN DIRECTION)  ? [DISCONTINUED] buPROPion (WELLBUTRIN SR) 200 MG 12 hr tablet Take 1 tablet (200 mg total) by mouth 2 (two) times daily.  ? [DISCONTINUED] desvenlafaxine (PRISTIQ) 100 MG 24 hr tablet Take 1 tablet (100 mg total) by mouth daily.  ? [DISCONTINUED] lisinopril (ZESTRIL) 10 MG tablet Take 1 tablet (10 mg total) by mouth daily.  ? [START ON 04/29/2021] ALPRAZolam (XANAX) 0.5 MG tablet TAKE ONE TAB IN AM AND ONE AND HALF AT NIGHT FOR SLEEP ( THIS IS CHANGE IN DIRECTION)  ? buPROPion (WELLBUTRIN SR) 200 MG 12 hr tablet Take 1 tablet (200 mg total) by mouth 2 (two) times daily.  ? desvenlafaxine (PRISTIQ) 100 MG 24 hr tablet Take 1 tablet (100 mg total) by mouth daily.  ? lisinopril (ZESTRIL) 10 MG tablet Take 1 tablet (10 mg total) by mouth daily.  ? ?No facility-administered encounter medications on file as of 04/01/2021.  ? ? ?Surgical History: ?Past Surgical History:  ?Procedure Laterality Date  ? ABDOMINAL HYSTERECTOMY  1990  ? APPENDECTOMY  1969  ? BLADDER SURGERY  2011  ? BREAST BIOPSY Left   ? benign  ? COLONOSCOPY WITH PROPOFOL N/A 02/01/2018  ? Procedure: COLONOSCOPY WITH PROPOFOL;  Surgeon: [name], MD;  Location: [facility]  ENDOSCOPY;  Service: Gastroenterology;  Laterality: N/A;  ? LITHOTRIPSY  06/2011  ? ? ?Medical History: ?Past Medical History:  ?Diagnosis Date  ? Anxiety   ? Arthritis   ? Depression   ? HTN (hypertension)   ? Hyperlipidemia   ? Kidney stones 2013  ? ? ?Family History: ?Family History  ?Problem Relation Age of Onset  ? Breast cancer Mother 73  ? Heart attack Son   ? ? ?Social History  ? ?Socioeconomic History  ? Marital status: Married  ?  Spouse name: Not on file  ? Number of children: Not on file  ? Years of education: Not on file  ? Highest education level: Not on file  ?Occupational  History  ? Not on file  ?Tobacco Use  ? Smoking status: Every Day  ?  Packs/day: 0.50  ?  Years: 30.00  ?  Pack years: 15.00  ?  Types: Cigarettes  ? Smokeless tobacco: Never  ?Vaping Use  ? Vaping Use: Never used  ?Substance and Sexual Activity  ? Alcohol use: Yes  ?  Comment: occassionally  ? Drug use: No  ? Sexual activity: Never  ?Other Topics Concern  ? Not on file  ?Social History Narrative  ? Not on file  ? ?Social Determinants of Health  ? ?Financial Resource Strain: Not on file  ?Food Insecurity: Not on file  ?Transportation Needs: Not on file  ?Physical Activity: Not on file  ?Stress: Not on file  ?Social Connections: Not on file  ?Intimate Partner Violence: Not on file  ? ? ? ? ?Review of Systems  ?Constitutional:  Negative for chills, fatigue and unexpected weight change.  ?HENT:  Negative for congestion, rhinorrhea, sneezing and sore throat.   ?Eyes:  Negative for redness.  ?Respiratory:  Negative for cough, chest tightness and shortness of breath.   ?Cardiovascular:  Negative for chest pain and palpitations.  ?Gastrointestinal:  Negative for abdominal pain, constipation, diarrhea, nausea and vomiting.  ?Genitourinary:  Negative for dysuria and frequency.  ?Musculoskeletal:  Negative for arthralgias, back pain, joint swelling and neck pain.  ?Skin:  Negative for rash.  ?Neurological: Negative.  Negative for tremors and numbness.  ?Hematological:  Negative for adenopathy. Does not bruise/bleed easily.  ?Psychiatric/Behavioral:  Positive for sleep disturbance. Negative for behavioral problems (Depression), self-injury and suicidal ideas. The patient is nervous/anxious.   ? ?Vital Signs: ?BP 128/82   Pulse 88   Temp 98.2 ?F (36.8 ?C)   Resp 16   Ht '5\' 4"'$  (1.626 m)   Wt 169 lb 9.6 oz (76.9 kg)   SpO2 98%   BMI 29.11 kg/m?  ? ? ?Physical Exam ?Vitals reviewed.  ?Constitutional:   ?   General: She is not in acute distress. ?   Appearance: Normal appearance. She is obese. She is not ill-appearing.   ?HENT:  ?   Head: Normocephalic and atraumatic.  ?Eyes:  ?   Pupils: Pupils are equal, round, and reactive to light.  ?Cardiovascular:  ?   Rate and Rhythm: Normal rate and regular rhythm.  ?Pulmonary:  ?   Effort: Pulmonary effort is normal. No respiratory distress.  ?Neurological:  ?   Mental Status: She is alert and oriented to person, place, and time.  ?   Gait: Gait normal.  ?Psychiatric:     ?   Mood and Affect: Mood normal.     ?   Behavior: Behavior normal.  ? ? ? ? ? ?Assessment/Plan: ?1. Essential hypertension ?Stable, refills ordered.  ?-  lisinopril (ZESTRIL) 10 MG tablet; Take 1 tablet (10 mg total) by mouth daily.  Dispense: 90 tablet; Refill: 1 ? ?2. Encounter for long-term (current) drug use ?Urine drug screen is positive for benzodiazepines which is consistent with her current prescriptions. ?- POCT Urine Drug Screen ? ?3. Generalized anxiety disorder ?Stable, medication refills ordered. ?- buPROPion (WELLBUTRIN SR) 200 MG 12 hr tablet; Take 1 tablet (200 mg total) by mouth 2 (two) times daily.  Dispense: 180 tablet; Refill: 1 ?- ALPRAZolam (XANAX) 0.5 MG tablet; TAKE ONE TAB IN AM AND ONE AND HALF AT NIGHT FOR SLEEP ( THIS IS CHANGE IN DIRECTION)  Dispense: 75 tablet; Refill: 2 ? ?4. Major depressive disorder, recurrent, moderate ([place]) ?Stable, medication refills ordered. ?- desvenlafaxine (PRISTIQ) 100 MG 24 hr tablet; Take 1 tablet (100 mg total) by mouth daily.  Dispense: 90 tablet; Refill: 1 ? ? ?General Counseling: makinsey pepitone understanding of the findings of todays visit and agrees with plan of treatment. I have discussed any further diagnostic evaluation that may be needed or ordered today. We also reviewed her medications today. she has been encouraged to call the office with any questions or concerns that should arise related to todays visit. ? ? ? ?Orders Placed This Encounter  ?Procedures  ? POCT Urine Drug Screen  ? ? ?Meds ordered this encounter  ?Medications  ? buPROPion  (WELLBUTRIN SR) 200 MG 12 hr tablet  ?  Sig: Take 1 tablet (200 mg total) by mouth 2 (two) times daily.  ?  Dispense:  180 tablet  ?  Refill:  1  ? desvenlafaxine (PRISTIQ) 100 MG 24 hr tablet  ?  Sig: Take 1 tablet (100 mg to

## 2021-04-23 ENCOUNTER — Encounter: Payer: Medicare Other | Admitting: Dermatology

## 2021-05-12 ENCOUNTER — Ambulatory Visit (INDEPENDENT_AMBULATORY_CARE_PROVIDER_SITE_OTHER): Payer: Medicare Other | Admitting: Dermatology

## 2021-05-12 ENCOUNTER — Encounter: Payer: Self-pay | Admitting: Dermatology

## 2021-05-12 DIAGNOSIS — L820 Inflamed seborrheic keratosis: Secondary | ICD-10-CM

## 2021-05-12 DIAGNOSIS — D1800 Hemangioma unspecified site: Secondary | ICD-10-CM

## 2021-05-12 DIAGNOSIS — I781 Nevus, non-neoplastic: Secondary | ICD-10-CM

## 2021-05-12 DIAGNOSIS — D229 Melanocytic nevi, unspecified: Secondary | ICD-10-CM

## 2021-05-12 DIAGNOSIS — L821 Other seborrheic keratosis: Secondary | ICD-10-CM

## 2021-05-12 DIAGNOSIS — L304 Erythema intertrigo: Secondary | ICD-10-CM

## 2021-05-12 DIAGNOSIS — L578 Other skin changes due to chronic exposure to nonionizing radiation: Secondary | ICD-10-CM | POA: Diagnosis not present

## 2021-05-12 DIAGNOSIS — L814 Other melanin hyperpigmentation: Secondary | ICD-10-CM

## 2021-05-12 DIAGNOSIS — Z1283 Encounter for screening for malignant neoplasm of skin: Secondary | ICD-10-CM

## 2021-05-12 MED ORDER — HYDROCORTISONE 2.5 % EX CREA: TOPICAL_CREAM | CUTANEOUS | 2 refills | Status: DC

## 2021-05-12 MED ORDER — KETOCONAZOLE 2 % EX CREA: TOPICAL_CREAM | CUTANEOUS | 2 refills | Status: DC

## 2021-05-12 NOTE — Patient Instructions (Addendum)
Cryotherapy Aftercare ? ?Wash gently with soap and water everyday.   ?Apply Vaseline and Band-Aid daily until healed.  ? ?Prior to procedure, discussed risks of blister formation, small wound, skin dyspigmentation, or rare scar following cryotherapy. Recommend Vaseline ointment to treated areas while healing.  ? ?Breasts/Chest ?Start Ketoconazole cream Apply to affected areas Monday, Wednesday and Friday at bedtime ?Start Hydrocortisone 2.5% cream Apply to affected areas Tuesday, Thursday and Saturday at bedtime as needed only. ? ? ?Recommend daily broad spectrum sunscreen SPF 30+ to sun-exposed areas, reapply every 2 hours as needed. Call for new or changing lesions.  ?Staying in the shade or wearing long sleeves, sun glasses (UVA+UVB protection) and wide brim hats (4-inch brim around the entire circumference of the hat) are also recommended for sun protection.  ? ? ?Melanoma ABCDEs ? ?Melanoma is the most dangerous type of skin cancer, and is the leading cause of death from skin disease.  You are more likely to develop melanoma if you: ?Have light-colored skin, light-colored eyes, or red or blond hair ?Spend a lot of time in the sun ?Tan regularly, either outdoors or in a tanning bed ?Have had blistering sunburns, especially during childhood ?Have a close family member who has had a melanoma ?Have atypical moles or large birthmarks ? ?Early detection of melanoma is key since treatment is typically straightforward and cure rates are extremely high if we catch it early.  ? ?The first sign of melanoma is often a change in a mole or a new dark spot.  The ABCDE system is a way of remembering the signs of melanoma. ? ?A for asymmetry:  The two halves do not match. ?B for border:  The edges of the growth are irregular. ?C for color:  A mixture of colors are present instead of an even brown color. ?D for diameter:  Melanomas are usually (but not always) greater than 69m - the size of a pencil eraser. ?E for evolution:   The spot keeps changing in size, shape, and color. ? ?Please check your skin once per month between visits. You can use a small mirror in front and a large mirror behind you to keep an eye on the back side or your body.  ? ?If you see any new or changing lesions before your next follow-up, please call to schedule a visit. ? ?Please continue daily skin protection including broad spectrum sunscreen SPF 30+ to sun-exposed areas, reapplying every 2 hours as needed when you're outdoors.  ? ?Staying in the shade or wearing long sleeves, sun glasses (UVA+UVB protection) and wide brim hats (4-inch brim around the entire circumference of the hat) are also recommended for sun protection.   ? ? ?If You Need Anything After Your Visit ? ?If you have any questions or concerns for your doctor, please call our main line at [phone] press option 4 to reach your doctor's medical assistant. If no one answers, please leave a voicemail as directed and we will return your call as soon as possible. Messages left after 4 pm will be answered the following business day.  ? ?You may also send [place] message via MyChart. We typically respond to MyChart messages within 1-2 business days. ? ?For prescription refills, please ask your pharmacy to contact our office. Our fax number is [phone] ? ?If you have an urgent issue when the clinic is closed that cannot wait until the next business day, you can page your doctor at the number below.   ? ?  Please note that while we do our best to be available for urgent issues outside of office hours, we are not available 24/7.  ? ?If you have an urgent issue and are unable to reach [place], you may choose to seek medical care at your doctor's office, retail clinic, urgent care center, or emergency room. ? ?If you have a medical emergency, please immediately call 911 or go to the emergency department. ? ?Pager Numbers ? ?- Dr. Nehemiah Massed: [phone] ? ?- Dr. Laurence Ferrari: [phone] ? ?- Dr. Nicole Kindred:  [phone] ? ?In the event of inclement weather, please call our main line at [phone] for an update on the status of any delays or closures. ? ?Dermatology Medication Tips: ?Please keep the boxes that topical medications come in in order to help keep track of the instructions about where and how to use these. Pharmacies typically print the medication instructions only on the boxes and not directly on the medication tubes.  ? ?If your medication is too expensive, please contact our office at [phone] option 4 or send [place] a message through [place].  ? ?We are unable to tell what your co-pay for medications will be in advance as this is different depending on your insurance coverage. However, we may be able to find a substitute medication at lower cost or fill out paperwork to get insurance to cover a needed medication.  ? ?If a prior authorization is required to get your medication covered by your insurance company, please allow [place] 1-2 business days to complete this process. ? ?Drug prices often vary depending on where the prescription is filled and some pharmacies may offer cheaper prices. ? ?The website www.goodrx.com contains coupons for medications through different pharmacies. The prices here do not account for what the cost may be with help from insurance (it may be cheaper with your insurance), but the website can give you the price if you did not use any insurance.  ?- You can print the associated coupon and take it with your prescription to the pharmacy.  ?- You may also stop by our office during regular business hours and pick up a GoodRx coupon card.  ?- If you need your prescription sent electronically to a different pharmacy, notify our office through [facility] or by phone at [phone] option 4. ? ? ? ? ?Si Usted Necesita Algo Despu?s de Su Visita ? ?Tambi?n puede enviarnos un mensaje a trav?s de MyChart. Por lo general respondemos a los mensajes de MyChart en el transcurso de 1 a 2  d?as h?biles. ? ?Para renovar recetas, por favor pida a su farmacia que se ponga en contacto con nuestra oficina. Nuestro n?mero de fax es el [phone]. ? ?Si tiene un asunto urgente cuando la cl?nica est? cerrada y que no puede esperar hasta el siguiente d?a h?bil, puede llamar/localizar a su doctor(a) al n?mero que aparece a continuaci?n.  ? ?Por favor, tenga en cuenta que aunque hacemos todo lo posible para estar disponibles para asuntos urgentes fuera del horario de oficina, no estamos disponibles las 24 horas del d?a, los 7 d?as de la semana.  ? ?Si tiene un problema urgente y no puede comunicarse con nosotros, puede optar por buscar atenci?n m?dica  en el consultorio de su doctor(a), en una cl?nica privada, en un centro de atenci?n urgente o en una sala de emergencias. ? ?Si tiene Engineer, maintenance (IT) m?dica, por favor llame inmediatamente al 911 o vaya a la sala de emergencias. ? ?N?meros de b?per ? ?- Dr. Nehemiah Massed:  [phone] ? ?- Dra. Moye: [phone] ? ?- Dra. Nicole Kindred: [phone] ? ?En caso de inclemencias del tiempo, por favor llame a nuestra l?nea principal al [phone] para una actualizaci?n sobre el estado de cualquier retraso o cierre. ? ?Consejos para la medicaci?n en dermatolog?a: ?Por favor, guarde las cajas en las que vienen los medicamentos de uso t?pico para ayudarle a seguir las instrucciones sobre d?nde y c?mo usarlos. Las farmacias generalmente imprimen las instrucciones del medicamento s?lo en las cajas y no directamente en los tubos del [place].  ? ?Si su medicamento es muy caro, por favor, p?ngase en contacto con Zigmund Daniel llamando al [phone] y presione la opci?n 4 o env?enos un mensaje a trav?s de MyChart.  ? ?No podemos decirle cu?l ser? su copago por los medicamentos por adelantado ya que esto es diferente dependiendo de la cobertura de su seguro. Sin embargo, es posible que podamos encontrar un medicamento sustituto a Electrical engineer un formulario para que el  seguro cubra el medicamento que se considera necesario.  ? ?Si se requiere Ardelia Mems autorizaci?n previa para que su compa??a de seguros [place] su medicamento, por favor perm?tanos de 1 a 2 d?as h?biles para completar

## 2021-05-12 NOTE — Progress Notes (Signed)
? ?Follow-Up Visit ?  ?Subjective  ?Amanda Mcgee is a 66 y.o. female who presents for the following: Annual Exam (Here for skin cancer screening. Full body. No personal hx of skin cancer or dysplastic nevi) and Rash (Upper abdominal area near breasts. Dur: 1 week. Using OTC HC cream, not helping). ?The patient presents for Total-Body Skin Exam (TBSE) for skin cancer screening and mole check.  The patient has spots, moles and lesions to be evaluated, some may be new or changing and the patient has concerns that these could be cancer. ? ?The following portions of the chart were reviewed this encounter and updated as appropriate:  Tobacco  Allergies  Meds  Problems  Med Hx  Surg Hx  Fam Hx   ?  ?Review of Systems: No other skin or systemic complaints except as noted in HPI or Assessment and Plan. ? ?Objective  ?Well appearing patient in no apparent distress; mood and affect are within normal limits. ? ?A full examination was performed including scalp, head, eyes, ears, nose, lips, neck, chest, axillae, abdomen, back, buttocks, bilateral upper extremities, bilateral lower extremities, hands, feet, fingers, toes, fingernails, and toenails. All findings within normal limits unless otherwise noted below. ? ?B/L Inframammary Folds ?Pinkness at inframammary ? ?Right Chest x1 ?Erythematous keratotic or waxy stuck-on papule or plaque. ? ? ?Assessment & Plan  ? ?Lentigines ?- Scattered tan macules ?- Due to sun exposure ?- Benign-appearing, observe ?- Recommend daily broad spectrum sunscreen SPF 30+ to sun-exposed areas, reapply every 2 hours as needed. ?- Call for any changes ? ?Seborrheic Keratoses ?- Stuck-on, waxy, tan-brown papules and/or plaques  ?- Benign-appearing ?- Discussed benign etiology and prognosis. ?- Observe ?- Call for any changes ? ?Melanocytic Nevi ?- Tan-brown and/or pink-flesh-colored symmetric macules and papules ?- Benign appearing on exam today ?- Observation ?- Call clinic for new or  changing moles ?- Recommend daily use of broad spectrum spf 30+ sunscreen to sun-exposed areas.  ? ?Hemangiomas ?- Red papules ?- Discussed benign nature ?- Observe ?- Call for any changes ? ?Actinic Damage ?- Chronic condition, secondary to cumulative UV/sun exposure ?- diffuse scaly erythematous macules with underlying dyspigmentation ?- Recommend daily broad spectrum sunscreen SPF 30+ to sun-exposed areas, reapply every 2 hours as needed.  ?- Staying in the shade or wearing long sleeves, sun glasses (UVA+UVB protection) and wide brim hats (4-inch brim around the entire circumference of the hat) are also recommended for sun protection.  ?- Call for new or changing lesions. ? ?Skin cancer screening performed today. ? ?Intertrigo ?B/L Inframammary Folds ?Chronic and persistent condition with duration or expected duration over one year. Condition is bothersome/symptomatic for patient. Currently flared. ?Start Ketoconazole 2% cream MWF at bedtime ?Start HC 2.5% cream Tue, Demetrius Charity, Saturday at bedtime ? ?ketoconazole (NIZORAL) 2 % cream - B/L Inframammary Folds ?Apply to affected areas Monday, Wednesday and Friday at bedtime ?hydrocortisone 2.5 % cream - B/L Inframammary Folds ?Apply to affected areas Tuesday, Thursday and Saturday at bedtime. PRN only ? ?Inflamed seborrheic keratosis ?Right Chest x1 ?Destruction of lesion - Right Chest x1 ?Complexity: simple   ?Destruction method: cryotherapy   ?Informed consent: discussed and consent obtained   ?Timeout:  patient name, date of birth, surgical site, and procedure verified ?Lesion destroyed using liquid nitrogen: Yes   ?Region frozen until ice ball extended beyond lesion: Yes   ?Outcome: patient tolerated procedure well with no complications   ?Post-procedure details: wound care instructions given   ? ?Telangiectasia ?- Dilated  blood vessel ?- Benign appearing on exam ?- Call for changes ?Discussed the treatment option of BBL/laser.  Typically we recommend 1-3 treatment  sessions about 5-8 weeks apart for best results.  The patient's condition may require "maintenance treatments" in the future.  The fee for BBL / laser treatments is $350 per treatment session for the whole face.  A fee can be quoted for other parts of the body. ?Insurance typically does not pay for BBL/laser treatments and therefore the fee is an out-of-pocket cost. ? ?Return in about 1 year (around 05/13/2022) for TBSE. ? ?I, Emelia Salisbury, CMA, am acting as scribe for Sarina Ser, MD. ?Documentation: I have reviewed the above documentation for accuracy and completeness, and I agree with the above. ? ?Sarina Ser, MD ? ? ?

## 2021-05-20 ENCOUNTER — Encounter: Payer: Self-pay | Admitting: Dermatology

## 2021-07-01 ENCOUNTER — Ambulatory Visit (INDEPENDENT_AMBULATORY_CARE_PROVIDER_SITE_OTHER): Payer: Medicare Other | Admitting: Nurse Practitioner

## 2021-07-01 ENCOUNTER — Encounter: Payer: Self-pay | Admitting: Nurse Practitioner

## 2021-07-01 VITALS — BP 135/82 | HR 81 | Temp 98.4°F | Resp 16 | Ht 65.0 in | Wt 164.4 lb

## 2021-07-01 DIAGNOSIS — J4520 Mild intermittent asthma, uncomplicated: Secondary | ICD-10-CM | POA: Diagnosis not present

## 2021-07-01 DIAGNOSIS — R42 Dizziness and giddiness: Secondary | ICD-10-CM | POA: Diagnosis not present

## 2021-07-01 DIAGNOSIS — J3089 Other allergic rhinitis: Secondary | ICD-10-CM | POA: Diagnosis not present

## 2021-07-01 DIAGNOSIS — F411 Generalized anxiety disorder: Secondary | ICD-10-CM

## 2021-07-01 DIAGNOSIS — F331 Major depressive disorder, recurrent, moderate: Secondary | ICD-10-CM

## 2021-07-01 DIAGNOSIS — I10 Essential (primary) hypertension: Secondary | ICD-10-CM

## 2021-07-01 MED ORDER — ALPRAZOLAM 0.5 MG PO TABS: ORAL_TABLET | ORAL | 2 refills | Status: DC

## 2021-07-01 MED ORDER — BUPROPION HCL ER (SR) 200 MG PO TB12: 200.0000 mg | ORAL_TABLET | Freq: Two times a day (BID) | ORAL | 1 refills | Status: DC

## 2021-07-01 MED ORDER — ALBUTEROL SULFATE HFA 108 (90 BASE) MCG/ACT IN AERS: 2.0000 | INHALATION_SPRAY | Freq: Four times a day (QID) | RESPIRATORY_TRACT | 1 refills | Status: DC | PRN

## 2021-07-01 MED ORDER — LISINOPRIL 10 MG PO TABS: 10.0000 mg | ORAL_TABLET | Freq: Every day | ORAL | 1 refills | Status: DC

## 2021-07-01 MED ORDER — DESVENLAFAXINE SUCCINATE ER 100 MG PO TB24: 100.0000 mg | ORAL_TABLET | Freq: Every day | ORAL | 1 refills | Status: DC

## 2021-07-01 MED ORDER — MECLIZINE HCL 25 MG PO TABS: ORAL_TABLET | ORAL | 1 refills | Status: DC

## 2021-07-01 MED ORDER — LEVOCETIRIZINE DIHYDROCHLORIDE 5 MG PO TABS: 5.0000 mg | ORAL_TABLET | Freq: Every day | ORAL | 1 refills | Status: DC

## 2021-07-01 MED ORDER — AMLODIPINE BESYLATE 5 MG PO TABS: 5.0000 mg | ORAL_TABLET | Freq: Every day | ORAL | 3 refills | Status: DC

## 2021-07-01 NOTE — Progress Notes (Signed)
[facility] [place], [place] [address]  Internal MEDICINE  Office Visit Note  Patient Name: Amanda Mcgee  [id number]  [id number]  Date of Service: 07/01/2021  Chief Complaint  Patient presents with   Follow-up   Depression   Hypertension   Hyperlipidemia   Anxiety   Medication Refill    HPI Amanda Mcgee presents for a follow-up visit for hypertension, mild intermittent asthma, irritable bowel syndrome, hypothyroidism and medication refills.  Her blood pressure and other vital signs are stable and within normal limits.  She denies any significant changes in her diet or lifestyle since her previous office visit and denies having any significant adverse side effects of any of her medications.  she has no other significant concerns or problems to address today and denies any new or worsening pain.    Current Medication: Outpatient Encounter Medications as of 07/01/2021  Medication Sig   calcium carbonate (OS-CAL) 600 MG TABS tablet Take 600 mg by mouth 2 (two) times daily with a meal.   Cholecalciferol (VITAMIN D3) 400 units CAPS Take by mouth daily.   clotrimazole-betamethasone (LOTRISONE) cream Apply 1 application topically 2 (two) times daily.   CRANBERRY PO Take 1 tablet by mouth 2 (two) times daily.   docusate sodium (COLACE) 100 MG capsule Take 100 mg by mouth 2 (two) times daily. prn   fluticasone (FLONASE) 50 MCG/ACT nasal spray Place 1 spray into both nostrils daily.   hydrocortisone 2.5 % cream Apply to affected areas Tuesday, Thursday and Saturday at bedtime. PRN only   ketoconazole (NIZORAL) 2 % cream Apply to affected areas Monday, Wednesday and Friday at bedtime   [DISCONTINUED] albuterol (VENTOLIN HFA) 108 (90 Base) MCG/ACT inhaler Inhale 2 puffs into the lungs every 6 (six) hours as needed for wheezing or shortness of breath.   [DISCONTINUED] ALPRAZolam (XANAX) 0.5 MG tablet TAKE ONE TAB IN AM AND ONE AND HALF AT NIGHT FOR SLEEP ( THIS IS CHANGE IN  DIRECTION)   [DISCONTINUED] amLODipine (NORVASC) 5 MG tablet Take 1 tablet (5 mg total) by mouth daily.   [DISCONTINUED] buPROPion (WELLBUTRIN SR) 200 MG 12 hr tablet Take 1 tablet (200 mg total) by mouth 2 (two) times daily.   [DISCONTINUED] desvenlafaxine (PRISTIQ) 100 MG 24 hr tablet Take 1 tablet (100 mg total) by mouth daily.   [DISCONTINUED] levocetirizine (XYZAL) 5 MG tablet Take 1 tablet (5 mg total) by mouth daily.   [DISCONTINUED] lisinopril (ZESTRIL) 10 MG tablet Take 1 tablet (10 mg total) by mouth daily.   [DISCONTINUED] meclizine (ANTIVERT) 25 MG tablet Take twice daily as needed for dizziness   albuterol (VENTOLIN HFA) 108 (90 Base) MCG/ACT inhaler Inhale 2 puffs into the lungs every 6 (six) hours as needed for wheezing or shortness of breath.   ALPRAZolam (XANAX) 0.5 MG tablet TAKE ONE TAB IN AM AND ONE AND HALF AT NIGHT FOR SLEEP ( THIS IS CHANGE IN DIRECTION)   amLODipine (NORVASC) 5 MG tablet Take 1 tablet (5 mg total) by mouth daily.   buPROPion (WELLBUTRIN SR) 200 MG 12 hr tablet Take 1 tablet (200 mg total) by mouth 2 (two) times daily.   desvenlafaxine (PRISTIQ) 100 MG 24 hr tablet Take 1 tablet (100 mg total) by mouth daily.   levocetirizine (XYZAL) 5 MG tablet Take 1 tablet (5 mg total) by mouth daily.   lisinopril (ZESTRIL) 10 MG tablet Take 1 tablet (10 mg total) by mouth daily.   meclizine (ANTIVERT) 25 MG tablet Take twice daily  as needed for dizziness   No facility-administered encounter medications on file as of 07/01/2021.    Surgical History: Past Surgical History:  Procedure Laterality Date   [place]   BLADDER SURGERY  2011   BREAST BIOPSY Left    benign   COLONOSCOPY WITH PROPOFOL N/A 02/01/2018   Procedure: COLONOSCOPY WITH PROPOFOL;  Surgeon: [name], MD;  Location[facility] END[facility]COPY;  Service: Gastroenterology;  Laterality: N/A;   LITHOTRIPSY  06/2011    Medical History: Past Medical History:  Diagnosis  Date   Anxiety    Arthritis    Depression    HTN (hypertension)    Hyperlipidemia    Kidney stones 2013    Family History: Family History  Problem Relation Age of Onset   Breast cancer Mother 30   Heart attack Son     Social History   Socioeconomic History   Marital status: Married    Spouse name: Not on file   Number of children: Not on file   Years of education: Not on file   Highest education level: Not on file  Occupational History   Not on file  Tobacco Use   Smoking status: Every Day    Packs/day: 0.50    Years: 30.00    Total pack years: 15.00    Types: Cigarettes   Smokeless tobacco: Never  Vaping Use   Vaping Use: Never used  Substance and Sexual Activity   Alcohol use: Yes    Comment: occassionally   Drug use: No   Sexual activity: Never  Other Topics Concern   Not on file  Social History Narrative   Not on file   Social Determinants of Health   Financial Resource Strain: Not on file  Food Insecurity: Not on file  Transportation Needs: Not on file  Physical Activity: Not on file  Stress: Not on file  Social Connections: Not on file  Intimate Partner Violence: Not on file      Review of Systems  Constitutional:  Negative for chills, fatigue and unexpected weight change.  HENT:  Negative for congestion, rhinorrhea, sneezing and sore throat.   Eyes:  Negative for redness.  Respiratory:  Negative for cough, chest tightness and shortness of breath.   Cardiovascular:  Negative for chest pain and palpitations.  Gastrointestinal:  Negative for abdominal pain, constipation, diarrhea, nausea and vomiting.  Genitourinary:  Negative for dysuria and frequency.  Musculoskeletal:  Negative for arthralgias, back pain, joint swelling and neck pain.  Skin:  Negative for rash.  Neurological: Negative.  Negative for tremors and numbness.  Hematological:  Negative for adenopathy. Does not bruise/bleed easily.  Psychiatric/Behavioral:  Negative for behavioral  problems (Depression), sleep disturbance and suicidal ideas. The patient is not nervous/anxious.     Vital Signs: BP 135/82   Pulse 81   Temp 98.4 F (36.9 C)   Resp 16   Ht '5\' 5"'  (1.651 m)   Wt 164 lb 6.4 oz (74.6 kg)   SpO2 97%   BMI 27.36 kg/m    Physical Exam Vitals reviewed.  Constitutional:      General: She is not in acute distress.    Appearance: Normal appearance. She is not ill-appearing.  HENT:     Head: Normocephalic and atraumatic.     Right Ear: Ear canal and external ear normal. There is impacted cerumen.     Left Ear: Ear canal and external ear normal. There is impacted cerumen.  Ears:     Comments: Patient is ears are impacted with wax in her tympanic membranes are not able to be visualized today.  Patient reports she will go home and use her eardrops and have her husband wash her ears out with their ear cleaning kit.  Patient will call the clinic to schedule nurse visit if unsuccessful and in need of ear wash by the staff. Eyes:     Pupils: Pupils are equal, round, and reactive to light.  Cardiovascular:     Heart sounds: Normal heart sounds. No murmur heard.    No friction rub. No gallop.  Pulmonary:     Effort: Pulmonary effort is normal. No respiratory distress.     Breath sounds: Normal breath sounds. No wheezing.  Neurological:     Mental Status: She is alert and oriented to person, place, and time.  Psychiatric:        Mood and Affect: Mood normal.        Behavior: Behavior normal.        Assessment/Plan: 1. Essential hypertension Stable, blood pressure is well controlled with current medications, refills ordered. - amLODipine (NORVASC) 5 MG tablet; Take 1 tablet (5 mg total) by mouth daily.  Dispense: 90 tablet; Refill: 3 - lisinopril (ZESTRIL) 10 MG tablet; Take 1 tablet (10 mg total) by mouth daily.  Dispense: 90 tablet; Refill: 1  2. Mild intermittent asthma without complication Stable, no significant change in respiratory status,  inhaler refills ordered. - albuterol (VENTOLIN HFA) 108 (90 Base) MCG/ACT inhaler; Inhale 2 puffs into the lungs every 6 (six) hours as needed for wheezing or shortness of breath.  Dispense: 18 g; Refill: 1  3. Vertigo Takes meclizine as needed for vertigo, does not need medication every day but does need refills.  Refills ordered - meclizine (ANTIVERT) 25 MG tablet; Take twice daily as needed for dizziness  Dispense: 90 tablet; Refill: 1  4. Non-seasonal allergic rhinitis due to other allergic trigger Stable, refills ordered. - levocetirizine (XYZAL) 5 MG tablet; Take 1 tablet (5 mg total) by mouth daily.  Dispense: 90 tablet; Refill: 1  5. Major depressive disorder, recurrent, moderate (HCC) Stable, refills ordered. - desvenlafaxine (PRISTIQ) 100 MG 24 hr tablet; Take 1 tablet (100 mg total) by mouth daily.  Dispense: 90 tablet; Refill: 1  6. Generalized anxiety disorder Anxiety is well controlled with current medications, refills ordered. - ALPRAZolam (XANAX) 0.5 MG tablet; TAKE ONE TAB IN AM AND ONE AND HALF AT NIGHT FOR SLEEP ( THIS IS CHANGE IN DIRECTION)  Dispense: 75 tablet; Refill: 2 - desvenlafaxine (PRISTIQ) 100 MG 24 hr tablet; Take 1 tablet (100 mg total) by mouth daily.  Dispense: 90 tablet; Refill: 1 - buPROPion (WELLBUTRIN SR) 200 MG 12 hr tablet; Take 1 tablet (200 mg total) by mouth 2 (two) times daily.  Dispense: 180 tablet; Refill: 1   General Counseling: Amanda Mcgee verbalizes understanding of the findings of todays visit and agrees with plan of treatment. I have discussed any further diagnostic evaluation that may be needed or ordered today. We also reviewed her medications today. she has been encouraged to call the office with any questions or concerns that should arise related to todays visit.    No orders of the defined types were placed in this encounter.   Meds ordered this encounter  Medications   ALPRAZolam (XANAX) 0.5 MG tablet    Sig: TAKE ONE TAB IN AM AND  ONE AND HALF AT NIGHT FOR SLEEP ( THIS IS  CHANGE IN DIRECTION)    Dispense:  75 tablet    Refill:  2    F41.9, just picked up last refill on 06/30/21, so first refill for this order will be July.   amLODipine (NORVASC) 5 MG tablet    Sig: Take 1 tablet (5 mg total) by mouth daily.    Dispense:  90 tablet    Refill:  3   meclizine (ANTIVERT) 25 MG tablet    Sig: Take twice daily as needed for dizziness    Dispense:  90 tablet    Refill:  1    Patient will call for refills when she needs them.   lisinopril (ZESTRIL) 10 MG tablet    Sig: Take 1 tablet (10 mg total) by mouth daily.    Dispense:  90 tablet    Refill:  1    For future refills   desvenlafaxine (PRISTIQ) 100 MG 24 hr tablet    Sig: Take 1 tablet (100 mg total) by mouth daily.    Dispense:  90 tablet    Refill:  1    Ok to fill as 90 day prescription, for future refills   albuterol (VENTOLIN HFA) 108 (90 Base) MCG/ACT inhaler    Sig: Inhale 2 puffs into the lungs every 6 (six) hours as needed for wheezing or shortness of breath.    Dispense:  18 g    Refill:  1    For future refills   buPROPion (WELLBUTRIN SR) 200 MG 12 hr tablet    Sig: Take 1 tablet (200 mg total) by mouth 2 (two) times daily.    Dispense:  180 tablet    Refill:  1    For future refill   levocetirizine (XYZAL) 5 MG tablet    Sig: Take 1 tablet (5 mg total) by mouth daily.    Dispense:  90 tablet    Refill:  1    For future refill    Return in about 3 months (around 10/01/2021) for F/U, med refill, Amanda Mcgee PCP.   Total time spent:30 Minutes Time spent includes review of chart, medications, test results, and follow up plan with the patient.   [place] Controlled Substance Database was reviewed by me.  This patient was seen by Jonetta Osgood, FNP-C in collaboration with Dr. Clayborn Bigness as a part of collaborative care agreement.   Amanda Scronce R. Valetta Fuller, MSN, FNP-C Internal medicine

## 2021-08-27 ENCOUNTER — Other Ambulatory Visit: Payer: Self-pay | Admitting: Internal Medicine

## 2021-08-27 DIAGNOSIS — J300 Vasomotor rhinitis: Secondary | ICD-10-CM

## 2021-09-24 ENCOUNTER — Encounter: Payer: Self-pay | Admitting: Nurse Practitioner

## 2021-09-24 ENCOUNTER — Ambulatory Visit (INDEPENDENT_AMBULATORY_CARE_PROVIDER_SITE_OTHER): Payer: Medicare Other | Admitting: Nurse Practitioner

## 2021-09-24 VITALS — BP 129/70 | HR 81 | Temp 97.4°F | Resp 16 | Ht 65.0 in | Wt 164.6 lb

## 2021-09-24 DIAGNOSIS — I10 Essential (primary) hypertension: Secondary | ICD-10-CM

## 2021-09-24 DIAGNOSIS — R300 Dysuria: Secondary | ICD-10-CM

## 2021-09-24 DIAGNOSIS — Z760 Encounter for issue of repeat prescription: Secondary | ICD-10-CM | POA: Diagnosis not present

## 2021-09-24 DIAGNOSIS — N3001 Acute cystitis with hematuria: Secondary | ICD-10-CM | POA: Diagnosis not present

## 2021-09-24 DIAGNOSIS — F411 Generalized anxiety disorder: Secondary | ICD-10-CM

## 2021-09-24 DIAGNOSIS — E039 Hypothyroidism, unspecified: Secondary | ICD-10-CM

## 2021-09-24 DIAGNOSIS — E782 Mixed hyperlipidemia: Secondary | ICD-10-CM

## 2021-09-24 DIAGNOSIS — E559 Vitamin D deficiency, unspecified: Secondary | ICD-10-CM

## 2021-09-24 DIAGNOSIS — F331 Major depressive disorder, recurrent, moderate: Secondary | ICD-10-CM

## 2021-09-24 DIAGNOSIS — Z0000 Encounter for general adult medical examination without abnormal findings: Secondary | ICD-10-CM

## 2021-09-24 MED ORDER — LEVOCETIRIZINE DIHYDROCHLORIDE 5 MG PO TABS: 5.0000 mg | ORAL_TABLET | Freq: Every day | ORAL | 1 refills | Status: DC

## 2021-09-24 MED ORDER — SULFAMETHOXAZOLE-TRIMETHOPRIM 800-160 MG PO TABS: 1.0000 | ORAL_TABLET | Freq: Two times a day (BID) | ORAL | 0 refills | Status: AC

## 2021-09-24 MED ORDER — DESVENLAFAXINE SUCCINATE ER 100 MG PO TB24: 100.0000 mg | ORAL_TABLET | Freq: Every day | ORAL | 1 refills | Status: DC

## 2021-09-24 MED ORDER — BUPROPION HCL ER (SR) 200 MG PO TB12: 200.0000 mg | ORAL_TABLET | Freq: Two times a day (BID) | ORAL | 1 refills | Status: DC

## 2021-09-24 MED ORDER — LISINOPRIL 10 MG PO TABS: 10.0000 mg | ORAL_TABLET | Freq: Every day | ORAL | 1 refills | Status: DC

## 2021-09-24 MED ORDER — ALPRAZOLAM 0.5 MG PO TABS: ORAL_TABLET | ORAL | 2 refills | Status: DC

## 2021-09-24 NOTE — Progress Notes (Signed)
[facility] [place], [place] [address]  Internal MEDICINE  Office Visit Note  Patient Name: Amanda Mcgee  [id number]  [id number]  Date of Service: 09/24/2021  Chief Complaint  Patient presents with   Follow-up    Follow up med refill    Depression   Hyperlipidemia   Hypertension   Urinary Tract Infection    Pt complains of uti sxs.     HPI Amanda Mcgee presents for a follow up visit for UTI sx, med refills, hypertension, anxiety and depression UTI symptoms - urinary discomfort, pain with urination, urinary freq and urgency, suprapubic tenderness, back/flank pain, gross hematuria. Was given doxycycline last month at urgent care for UTI but symptoms never resolved. Has been taking AZO to alleviate urinary discomfort Anxiety -- stable, no significant changes in stress or anxiety, alprazolam refill due Depression -- stable, desvenlafaxine is effective, refills due, denies any depressed mood or other new or worsening symptoms.  Hypertension -- stable on amlodipine and lisinopril      Current Medication: Outpatient Encounter Medications as of 09/24/2021  Medication Sig   albuterol (VENTOLIN HFA) 108 (90 Base) MCG/ACT inhaler Inhale 2 puffs into the lungs every 6 (six) hours as needed for wheezing or shortness of breath.   amLODipine (NORVASC) 5 MG tablet Take 1 tablet (5 mg total) by mouth daily.   calcium carbonate (OS-CAL) 600 MG TABS tablet Take 600 mg by mouth 2 (two) times daily with a meal.   Cholecalciferol (VITAMIN D3) 400 units CAPS Take by mouth daily.   clotrimazole-betamethasone (LOTRISONE) cream Apply 1 application topically 2 (two) times daily.   CRANBERRY PO Take 1 tablet by mouth 2 (two) times daily.   docusate sodium (COLACE) 100 MG capsule Take 100 mg by mouth 2 (two) times daily. prn   fluticasone (FLONASE) 50 MCG/ACT nasal spray SPRAY 1 SPRAY INTO BOTH NOSTRILS DAILY.   hydrocortisone 2.5 % cream Apply to affected areas Tuesday, Thursday and  Saturday at bedtime. PRN only   ketoconazole (NIZORAL) 2 % cream Apply to affected areas Monday, Wednesday and Friday at bedtime   meclizine (ANTIVERT) 25 MG tablet Take twice daily as needed for dizziness   sulfamethoxazole-trimethoprim (BACTRIM DS) 800-160 MG tablet Take 1 tablet by mouth 2 (two) times daily for 10 days. Take with food.   [DISCONTINUED] ALPRAZolam (XANAX) 0.5 MG tablet TAKE ONE TAB IN AM AND ONE AND HALF AT NIGHT FOR SLEEP ( THIS IS CHANGE IN DIRECTION)   [DISCONTINUED] buPROPion (WELLBUTRIN SR) 200 MG 12 hr tablet Take 1 tablet (200 mg total) by mouth 2 (two) times daily.   [DISCONTINUED] desvenlafaxine (PRISTIQ) 100 MG 24 hr tablet Take 1 tablet (100 mg total) by mouth daily.   [DISCONTINUED] levocetirizine (XYZAL) 5 MG tablet Take 1 tablet (5 mg total) by mouth daily.   [DISCONTINUED] lisinopril (ZESTRIL) 10 MG tablet Take 1 tablet (10 mg total) by mouth daily.   ALPRAZolam (XANAX) 0.5 MG tablet TAKE ONE TAB IN AM AND ONE AND HALF AT NIGHT FOR SLEEP ( THIS IS CHANGE IN DIRECTION)   buPROPion (WELLBUTRIN SR) 200 MG 12 hr tablet Take 1 tablet (200 mg total) by mouth 2 (two) times daily.   desvenlafaxine (PRISTIQ) 100 MG 24 hr tablet Take 1 tablet (100 mg total) by mouth daily.   levocetirizine (XYZAL) 5 MG tablet Take 1 tablet (5 mg total) by mouth daily.   lisinopril (ZESTRIL) 10 MG tablet Take 1 tablet (10 mg total) by mouth daily.  No facility-administered encounter medications on file as of 09/24/2021.    Surgical History: Past Surgical History:  Procedure Laterality Date   [place]   BLADDER SURGERY  2011   BREAST BIOPSY Left    benign   COLONOSCOPY WITH PROPOFOL N/A 02/01/2018   Procedure: COLONOSCOPY WITH PROPOFOL;  Surgeon: [name], MD;  Location: [facility] ENDOSCOPY;  Service: Gastroenterology;  Laterality: N/A;   LITHOTRIPSY  06/2011    Medical History: Past Medical History:  Diagnosis Date   Anxiety    Arthritis     Depression    HTN (hypertension)    Hyperlipidemia    Kidney stones 2013    Family History: Family History  Problem Relation Age of Onset   Breast cancer Mother 11   Heart attack Son     Social History   Socioeconomic History   Marital status: Married    Spouse name: Not on file   Number of children: Not on file   Years of education: Not on file   Highest education level: Not on file  Occupational History   Not on file  Tobacco Use   Smoking status: Every Day    Packs/day: 0.50    Years: 30.00    Total pack years: 15.00    Types: Cigarettes   Smokeless tobacco: Never  Vaping Use   Vaping Use: Never used  Substance and Sexual Activity   Alcohol use: Yes    Comment: occassionally   Drug use: No   Sexual activity: Never  Other Topics Concern   Not on file  Social History Narrative   Not on file   Social Determinants of Health   Financial Resource Strain: Not on file  Food Insecurity: Not on file  Transportation Needs: Not on file  Physical Activity: Not on file  Stress: Not on file  Social Connections: Not on file  Intimate Partner Violence: Not on file      Review of Systems  Constitutional:  Positive for fatigue. Negative for chills and fever.  HENT: Negative.    Respiratory:  Negative for cough, chest tightness, shortness of breath and wheezing.   Cardiovascular: Negative.  Negative for chest pain and palpitations.  Gastrointestinal: Negative.   Genitourinary:  Positive for decreased urine volume, dysuria, flank pain, frequency, hematuria and urgency.  Musculoskeletal:  Negative for myalgias.  Neurological:  Negative for headaches.  Psychiatric/Behavioral: Negative.  Negative for behavioral problems, self-injury, sleep disturbance and suicidal ideas. The patient is not nervous/anxious.     Vital Signs: BP 129/70   Pulse 81   Temp (!) 97.4 F (36.3 C)   Resp 16   Ht '5\' 5"'  (1.651 m)   Wt 164 lb 9.6 oz (74.7 kg)   SpO2 97%   BMI 27.39 kg/m     Physical Exam Vitals reviewed.  Constitutional:      General: She is not in acute distress.    Appearance: Normal appearance. She is not ill-appearing.  HENT:     Head: Normocephalic and atraumatic.  Eyes:     Pupils: Pupils are equal, round, and reactive to light.  Cardiovascular:     Rate and Rhythm: Normal rate and regular rhythm.  Pulmonary:     Effort: Pulmonary effort is normal. No respiratory distress.  Abdominal:     Tenderness: There is abdominal tenderness in the suprapubic area. There is no right CVA tenderness or left CVA tenderness.  Neurological:     Mental Status:  She is alert and oriented to person, place, and time.  Psychiatric:        Mood and Affect: Mood normal.        Behavior: Behavior normal.        Assessment/Plan: 1. Acute cystitis with hematuria Urine sent for culture. Bactrim prescribed for empiric treatment of UTI, most likely cystitis; allergy noted to cipro and macrobid; will adjust treatment if necessary pending culture results.  - UA/M w/rflx Culture, Routine - sulfamethoxazole-trimethoprim (BACTRIM DS) 800-160 MG tablet; Take 1 tablet by mouth 2 (two) times daily for 10 days. Take with food.  Dispense: 20 tablet; Refill: 0  2. Essential hypertension Continue amlodipine and lisinopril as prescribed.   3. Medication refill - ALPRAZolam (XANAX) 0.5 MG tablet; TAKE ONE TAB IN AM AND ONE AND HALF AT NIGHT FOR SLEEP ( THIS IS CHANGE IN DIRECTION)  Dispense: 75 tablet; Refill: 2 - lisinopril (ZESTRIL) 10 MG tablet; Take 1 tablet (10 mg total) by mouth daily.  Dispense: 90 tablet; Refill: 1 - levocetirizine (XYZAL) 5 MG tablet; Take 1 tablet (5 mg total) by mouth daily.  Dispense: 90 tablet; Refill: 1 - desvenlafaxine (PRISTIQ) 100 MG 24 hr tablet; Take 1 tablet (100 mg total) by mouth daily.  Dispense: 90 tablet; Refill: 1 - buPROPion (WELLBUTRIN SR) 200 MG 12 hr tablet; Take 1 tablet (200 mg total) by mouth 2 (two) times daily.  Dispense: 180  tablet; Refill: 1  4. Generalized anxiety disorder continue medication as prescribed, follow up in 3 months for additional refills of alprazolam.   5. Major depressive disorder, recurrent, moderate ([place]) continue medication as prescribed.   6. Dysuria Urine specimen obtained and sent to lab, pending results, will notify patient - UA/M w/rflx Culture, Routine  7. Routine health maintenance For upcoming AWV - CBC with Differential/Platelet - CMP14+EGFR - Lipid Profile - Vitamin D (25 hydroxy) - TSH + free T4   General Counseling: Amanda Mcgee verbalizes understanding of the findings of todays visit and agrees with plan of treatment. I have discussed any further diagnostic evaluation that may be needed or ordered today. We also reviewed her medications today. she has been encouraged to call the office with any questions or concerns that should arise related to todays visit.    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    Dispense:  90 tablet    Refill:  1    For future refills   levocetirizine (XYZAL) 5 MG tablet    Sig: Take 1 tablet (5 mg total) by mouth daily.    Dispense:  90 tablet    Refill:  1    For future refill   desvenlafaxine (PRISTIQ) 100 MG 24 hr tablet    Sig: Take 1 tablet (100 mg total) by mouth daily.    Dispense:  90 tablet    Refill:  1    Ok to fill as 90 day prescription, for future refills   buPROPion (WELLBUTRIN SR) 200 MG 12 hr tablet  Sig: Take 1 tablet (200 mg total) by mouth 2 (two) times daily.    Dispense:  180 tablet    Refill:  1    For future refill    sulfamethoxazole-trimethoprim (BACTRIM DS) 800-160 MG tablet    Sig: Take 1 tablet by mouth 2 (two) times daily for 10 days. Take with food.    Dispense:  20 tablet    Refill:  0    Return for previously scheduled, CPE, Marcas Bowsher PCP and refills in december. .   Total time spent:30 Minutes Time spent includes review of chart, medications, test results, and follow up plan with the patient.   [place] Controlled Substance Database was reviewed by me.  This patient was seen by Jonetta Osgood, FNP-C in collaboration with Dr. Clayborn Bigness as a part of collaborative care agreement.   Franchot Pollitt R. Valetta Fuller, MSN, FNP-C Internal medicine

## 2021-09-28 LAB — UA/M W/RFLX CULTURE, ROUTINE
Bilirubin, UA: NEGATIVE
Glucose, UA: NEGATIVE
Ketones, UA: NEGATIVE
Nitrite, UA: POSITIVE — AB
Protein,UA: NEGATIVE
RBC, UA: NEGATIVE
Specific Gravity, UA: 1.016 (ref 1.005–1.030)
Urobilinogen, Ur: 1 mg/dL (ref 0.2–1.0)
pH, UA: 5 (ref 5.0–7.5)

## 2021-09-28 LAB — URINE CULTURE, REFLEX

## 2021-09-28 LAB — MICROSCOPIC EXAMINATION
Bacteria, UA: NONE SEEN
Casts: NONE SEEN /lpf
RBC, Urine: NONE SEEN /hpf (ref 0–2)

## 2021-09-29 ENCOUNTER — Ambulatory Visit: Payer: Medicare Other | Admitting: Nurse Practitioner

## 2021-12-01 ENCOUNTER — Encounter: Payer: Self-pay | Admitting: Nurse Practitioner

## 2021-12-01 ENCOUNTER — Ambulatory Visit (INDEPENDENT_AMBULATORY_CARE_PROVIDER_SITE_OTHER): Payer: Medicare Other | Admitting: Nurse Practitioner

## 2021-12-01 VITALS — BP 130/80 | HR 92 | Temp 97.6°F | Resp 16 | Ht 65.0 in | Wt 161.4 lb

## 2021-12-01 DIAGNOSIS — N3001 Acute cystitis with hematuria: Secondary | ICD-10-CM | POA: Diagnosis not present

## 2021-12-01 DIAGNOSIS — R300 Dysuria: Secondary | ICD-10-CM | POA: Diagnosis not present

## 2021-12-01 DIAGNOSIS — Z87440 Personal history of urinary (tract) infections: Secondary | ICD-10-CM | POA: Diagnosis not present

## 2021-12-01 MED ORDER — SULFAMETHOXAZOLE-TRIMETHOPRIM 800-160 MG PO TABS: 1.0000 | ORAL_TABLET | Freq: Two times a day (BID) | ORAL | 0 refills | Status: AC

## 2021-12-01 NOTE — Progress Notes (Signed)
[facility] [place], [place] [address]  Internal MEDICINE  Office Visit Note  Patient Name: Amanda Mcgee  [id number]  [id number]  Date of Service: 12/01/2021  Chief Complaint  Patient presents with   Acute Visit    uti     HPI Amanda Mcgee presents for an acute sick visit for possible UTI. 5th UTI this year or frequent relapsing or unresolved UTI --onset: today  --sx: back pain, urgency, frequency, burning with urination  --what alleviates: taking AZO, drinking water --Severity: mod     Current Medication:  Outpatient Encounter Medications as of 12/01/2021  Medication Sig   albuterol (VENTOLIN HFA) 108 (90 Base) MCG/ACT inhaler Inhale 2 puffs into the lungs every 6 (six) hours as needed for wheezing or shortness of breath.   ALPRAZolam (XANAX) 0.5 MG tablet TAKE ONE TAB IN AM AND ONE AND HALF AT NIGHT FOR SLEEP ( THIS IS CHANGE IN DIRECTION)   amLODipine (NORVASC) 5 MG tablet Take 1 tablet (5 mg total) by mouth daily.   buPROPion (WELLBUTRIN SR) 200 MG 12 hr tablet Take 1 tablet (200 mg total) by mouth 2 (two) times daily.   calcium carbonate (OS-CAL) 600 MG TABS tablet Take 600 mg by mouth 2 (two) times daily with a meal.   Cholecalciferol (VITAMIN D3) 400 units CAPS Take by mouth daily.   clotrimazole-betamethasone (LOTRISONE) cream Apply 1 application topically 2 (two) times daily.   CRANBERRY PO Take 1 tablet by mouth 2 (two) times daily.   desvenlafaxine (PRISTIQ) 100 MG 24 hr tablet Take 1 tablet (100 mg total) by mouth daily.   docusate sodium (COLACE) 100 MG capsule Take 100 mg by mouth 2 (two) times daily. prn   fluticasone (FLONASE) 50 MCG/ACT nasal spray SPRAY 1 SPRAY INTO BOTH NOSTRILS DAILY.   hydrocortisone 2.5 % cream Apply to affected areas Tuesday, Thursday and Saturday at bedtime. PRN only   ketoconazole (NIZORAL) 2 % cream Apply to affected areas Monday, Wednesday and Friday at bedtime   levocetirizine (XYZAL) 5 MG tablet Take 1  tablet (5 mg total) by mouth daily.   lisinopril (ZESTRIL) 10 MG tablet Take 1 tablet (10 mg total) by mouth daily.   meclizine (ANTIVERT) 25 MG tablet Take twice daily as needed for dizziness   sulfamethoxazole-trimethoprim (BACTRIM DS) 800-160 MG tablet Take 1 tablet by mouth 2 (two) times daily for 7 days.   No facility-administered encounter medications on file as of 12/01/2021.      Medical History: Past Medical History:  Diagnosis Date   Anxiety    Arthritis    Depression    HTN (hypertension)    Hyperlipidemia    Kidney stones 2013     Vital Signs: BP 130/80   Pulse 92   Temp 97.6 F (36.4 C)   Resp 16   Ht '5\' 5"'$  (1.651 m)   Wt 161 lb 6.4 oz (73.2 kg)   SpO2 99%   BMI 26.86 kg/m    Review of Systems  Constitutional:  Positive for fatigue.  Respiratory: Negative.  Negative for cough, chest tightness, shortness of breath and wheezing.   Cardiovascular: Negative.  Negative for chest pain and palpitations.  Gastrointestinal:  Negative for abdominal pain, constipation, diarrhea, nausea and vomiting.  Genitourinary:  Positive for difficulty urinating, dysuria, flank pain, frequency, pelvic pain and urgency.    Physical Exam Vitals reviewed.  Constitutional:      General: She is not in acute distress.    Appearance: Normal  appearance. She is not ill-appearing.  HENT:     Head: Normocephalic and atraumatic.  Eyes:     Pupils: Pupils are equal, round, and reactive to light.  Cardiovascular:     Rate and Rhythm: Normal rate and regular rhythm.  Pulmonary:     Effort: Pulmonary effort is normal.  Neurological:     Mental Status: She is alert and oriented to person, place, and time.  Psychiatric:        Mood and Affect: Mood normal.        Behavior: Behavior normal.       Assessment/Plan: 1. Acute cystitis with hematuria Empiric antibiotic treatment prescribed, referred to urology for recurrent UTI - sulfamethoxazole-trimethoprim (BACTRIM DS) 800-160 MG  tablet; Take 1 tablet by mouth 2 (two) times daily for 7 days.  Dispense: 14 tablet; Refill: 0 - Urinalysis, Routine w reflex microscopic - Ambulatory referral to Urology  2. History of recurrent UTI (urinary tract infection) Referred to urology - Ambulatory referral to Urology  3. Dysuria Urinalysis sent to lab - Urinalysis, Routine w reflex microscopic   General Counseling: Amanda Mcgee understanding of the findings of todays visit and agrees with plan of treatment. I have discussed any further diagnostic evaluation that may be needed or ordered today. We also reviewed her medications today. she has been encouraged to call the office with any questions or concerns that should arise related to todays visit.    Counseling:    Orders Placed This Encounter  Procedures   Urinalysis, Routine w reflex microscopic   Ambulatory referral to Urology    Meds ordered this encounter  Medications   sulfamethoxazole-trimethoprim (BACTRIM DS) 800-160 MG tablet    Sig: Take 1 tablet by mouth 2 (two) times daily for 7 days.    Dispense:  14 tablet    Refill:  0    Return if symptoms worsen or fail to improve.  [place] Controlled Substance Database was reviewed by me for overdose risk score (ORS)  Time spent:30 Minutes Time spent with patient included reviewing progress notes, labs, imaging studies, and discussing plan for follow up.   This patient was seen by Jonetta Osgood, FNP-C in collaboration with Dr. Clayborn Bigness as a part of collaborative care agreement.  [name], MSN, FNP-C Internal Medicine

## 2021-12-02 ENCOUNTER — Encounter: Payer: Self-pay | Admitting: Nurse Practitioner

## 2021-12-23 ENCOUNTER — Ambulatory Visit: Payer: Medicare Other | Admitting: Nurse Practitioner

## 2021-12-31 ENCOUNTER — Other Ambulatory Visit: Payer: Self-pay | Admitting: Nurse Practitioner

## 2021-12-31 DIAGNOSIS — J4520 Mild intermittent asthma, uncomplicated: Secondary | ICD-10-CM

## 2022-01-06 ENCOUNTER — Encounter: Payer: Self-pay | Admitting: Nurse Practitioner

## 2022-01-06 ENCOUNTER — Ambulatory Visit (INDEPENDENT_AMBULATORY_CARE_PROVIDER_SITE_OTHER): Payer: Medicare Other | Admitting: Nurse Practitioner

## 2022-01-06 VITALS — BP 119/71 | HR 72 | Temp 98.2°F | Resp 16 | Ht 65.0 in | Wt 164.0 lb

## 2022-01-06 DIAGNOSIS — Z0001 Encounter for general adult medical examination with abnormal findings: Secondary | ICD-10-CM | POA: Diagnosis not present

## 2022-01-06 DIAGNOSIS — R300 Dysuria: Secondary | ICD-10-CM

## 2022-01-06 DIAGNOSIS — Z760 Encounter for issue of repeat prescription: Secondary | ICD-10-CM

## 2022-01-06 DIAGNOSIS — R42 Dizziness and giddiness: Secondary | ICD-10-CM

## 2022-01-06 DIAGNOSIS — Z1231 Encounter for screening mammogram for malignant neoplasm of breast: Secondary | ICD-10-CM

## 2022-01-06 MED ORDER — DESVENLAFAXINE SUCCINATE ER 100 MG PO TB24: 100.0000 mg | ORAL_TABLET | Freq: Every day | ORAL | 1 refills | Status: DC

## 2022-01-06 MED ORDER — LEVOCETIRIZINE DIHYDROCHLORIDE 5 MG PO TABS: 5.0000 mg | ORAL_TABLET | Freq: Every day | ORAL | 1 refills | Status: DC

## 2022-01-06 MED ORDER — ALPRAZOLAM 0.5 MG PO TABS: ORAL_TABLET | ORAL | 2 refills | Status: DC

## 2022-01-06 MED ORDER — LISINOPRIL 10 MG PO TABS: 10.0000 mg | ORAL_TABLET | Freq: Every day | ORAL | 1 refills | Status: DC

## 2022-01-06 MED ORDER — MECLIZINE HCL 25 MG PO TABS: ORAL_TABLET | ORAL | 1 refills | Status: DC

## 2022-01-06 MED ORDER — BUPROPION HCL ER (SR) 200 MG PO TB12: 200.0000 mg | ORAL_TABLET | Freq: Two times a day (BID) | ORAL | 1 refills | Status: DC

## 2022-01-06 NOTE — Progress Notes (Signed)
[facility] [place], [place] [address]  Internal MEDICINE  Office Visit Note  Patient Name: Amanda Mcgee  [id number]  [id number]  Date of Service: 01/06/2022  Chief Complaint  Patient presents with   Medicare Wellness   Depression   Hypertension   Hyperlipidemia   Quality Metric Gaps    Shingles, Pneumonia and TDAP    HPI Amanda Mcgee presents for an annual well visit and physical exam.  Well-appearing 66 y.o. female with hypertension, hypothyroidism, mild asthma, allergic rhinitis Routine CRC screening: due in 2024 Routine mammogram: due in march 2024 DEXA scan: done in 2021 Labs: ordered in September, reminded patient to have fasting labs drawn New or worsening pain: arthritis in left shoulder.  Other concerns: none     01/06/2022    9:05 AM 12/31/2020   10:18 AM 10/26/2019   11:46 AM  MMSE - Mini Mental State Exam  Orientation to time '5 5 5  '$ Orientation to Place '5 5 5  '$ Registration '3 3 3  '$ Attention/ Calculation '5 5 5  '$ Recall '3 3 3  '$ Language- name 2 objects '2 2 2  '$ Language- repeat '1 1 1  '$ Language- follow 3 step command '3 3 3  '$ Language- read & follow direction '1 1 1  '$ Write a sentence '1 1 1  '$ Copy design '1 1 1  '$ Total score '30 30 30    '$ Functional Status Survey: Is the patient deaf or have difficulty hearing?: No Does the patient have difficulty seeing, even when wearing glasses/contacts?: No Does the patient have difficulty concentrating, remembering, or making decisions?: No Does the patient have difficulty walking or climbing stairs?: No Does the patient have difficulty dressing or bathing?: No Does the patient have difficulty doing errands alone such as visiting a doctor's office or shopping?: No     12/31/2020   10:05 AM 04/01/2021    9:35 AM 07/01/2021    9:23 AM 09/24/2021    8:51 AM 01/06/2022    9:05 AM  Fall Risk  Falls in the past year? 1 0 0 1 0  Was there an injury with Fall? 1   0   Fall Risk Category Calculator 2   1    Fall Risk Category Moderate   Low   Patient Fall Risk Level Moderate fall risk Low fall risk Low fall risk Low fall risk   Patient at Risk for Falls Due to  No Fall Risks No Fall Risks No Fall Risks   Fall risk Follow up Falls evaluation completed Falls evaluation completed Falls evaluation completed Falls evaluation completed        01/06/2022    9:11 AM  Depression screen PHQ 2/9  Decreased Interest 0  Down, Depressed, Hopeless 0  PHQ - 2 Score 0      Current Medication: Outpatient Encounter Medications as of 01/06/2022  Medication Sig   albuterol (VENTOLIN HFA) 108 (90 Base) MCG/ACT inhaler TAKE 2 PUFFS BY MOUTH EVERY 6 HOURS AS NEEDED FOR WHEEZE OR SHORTNESS OF BREATH   amLODipine (NORVASC) 5 MG tablet Take 1 tablet (5 mg total) by mouth daily.   calcium carbonate (OS-CAL) 600 MG TABS tablet Take 600 mg by mouth 2 (two) times daily with a meal.   Cholecalciferol (VITAMIN D3) 400 units CAPS Take by mouth daily.   clotrimazole-betamethasone (LOTRISONE) cream Apply 1 application topically 2 (two) times daily.   CRANBERRY PO Take 1 tablet by mouth 2 (two) times daily.   docusate sodium (COLACE) 100  MG capsule Take 100 mg by mouth 2 (two) times daily. prn   fluticasone (FLONASE) 50 MCG/ACT nasal spray SPRAY 1 SPRAY INTO BOTH NOSTRILS DAILY.   hydrocortisone 2.5 % cream Apply to affected areas Tuesday, Thursday and Saturday at bedtime. PRN only   ketoconazole (NIZORAL) 2 % cream Apply to affected areas Monday, Wednesday and Friday at bedtime   [DISCONTINUED] ALPRAZolam (XANAX) 0.5 MG tablet TAKE ONE TAB IN AM AND ONE AND HALF AT NIGHT FOR SLEEP ( THIS IS CHANGE IN DIRECTION)   [DISCONTINUED] buPROPion (WELLBUTRIN SR) 200 MG 12 hr tablet Take 1 tablet (200 mg total) by mouth 2 (two) times daily.   [DISCONTINUED] desvenlafaxine (PRISTIQ) 100 MG 24 hr tablet Take 1 tablet (100 mg total) by mouth daily.   [DISCONTINUED] levocetirizine (XYZAL) 5 MG tablet Take 1 tablet (5 mg total) by  mouth daily.   [DISCONTINUED] lisinopril (ZESTRIL) 10 MG tablet Take 1 tablet (10 mg total) by mouth daily.   [DISCONTINUED] meclizine (ANTIVERT) 25 MG tablet Take twice daily as needed for dizziness   ALPRAZolam (XANAX) 0.5 MG tablet TAKE ONE TAB IN AM AND ONE AND HALF AT NIGHT FOR SLEEP ( THIS IS CHANGE IN DIRECTION)   buPROPion (WELLBUTRIN SR) 200 MG 12 hr tablet Take 1 tablet (200 mg total) by mouth 2 (two) times daily.   desvenlafaxine (PRISTIQ) 100 MG 24 hr tablet Take 1 tablet (100 mg total) by mouth daily.   levocetirizine (XYZAL) 5 MG tablet Take 1 tablet (5 mg total) by mouth daily.   lisinopril (ZESTRIL) 10 MG tablet Take 1 tablet (10 mg total) by mouth daily.   meclizine (ANTIVERT) 25 MG tablet Take twice daily as needed for dizziness   No facility-administered encounter medications on file as of 01/06/2022.    Surgical History: Past Surgical History:  Procedure Laterality Date   [place]   BLADDER SURGERY  2011   BREAST BIOPSY Left    benign   COLONOSCOPY WITH PROPOFOL N/A 02/01/2018   Procedure: COLONOSCOPY WITH PROPOFOL;  Surgeon: Amanda Bellows, MD;  Location: [facility] ENDOSCOPY;  Service: Gastroenterology;  Laterality: N/A;   LITHOTRIPSY  06/2011    Medical History: Past Medical History:  Diagnosis Date   Anxiety    Arthritis    Depression    HTN (hypertension)    Hyperlipidemia    Kidney stones 2013    Family History: Family History  Problem Relation Age of Onset   Breast cancer Mother 3   Heart attack Son     Social History   Socioeconomic History   Marital status: Married    Spouse name: Not on file   Number of children: Not on file   Years of education: Not on file   Highest education level: Not on file  Occupational History   Not on file  Tobacco Use   Smoking status: Every Day    Packs/day: 0.50    Years: 30.00    Total pack years: 15.00    Types: Cigarettes   Smokeless tobacco: Never   Tobacco  comments:    1/2 pack daily  Vaping Use   Vaping Use: Never used  Substance and Sexual Activity   Alcohol use: Yes    Comment: occassionally   Drug use: No   Sexual activity: Never  Other Topics Concern   Not on file  Social History Narrative   Not on file   Social Determinants of Health   Financial Resource  Strain: Not on file  Food Insecurity: Not on file  Transportation Needs: Not on file  Physical Activity: Not on file  Stress: Not on file  Social Connections: Not on file  Intimate Partner Violence: Not on file      Review of Systems  Constitutional:  Negative for activity change, appetite change, chills, fatigue, fever and unexpected weight change.  HENT: Negative.  Negative for congestion, ear pain, rhinorrhea, sore throat and trouble swallowing.   Eyes: Negative.   Respiratory: Negative.  Negative for cough, chest tightness, shortness of breath and wheezing.   Cardiovascular: Negative.  Negative for chest pain.  Gastrointestinal: Negative.  Negative for abdominal pain, blood in stool, constipation, diarrhea, nausea and vomiting.  Endocrine: Negative.   Genitourinary: Negative.  Negative for difficulty urinating, dysuria, frequency, hematuria and urgency.  Musculoskeletal: Negative.  Negative for arthralgias, back pain, joint swelling, myalgias and neck pain.  Skin: Negative.  Negative for rash and wound.  Allergic/Immunologic: Negative.  Negative for immunocompromised state.  Neurological: Negative.  Negative for dizziness, seizures, numbness and headaches.  Hematological: Negative.   Psychiatric/Behavioral:  Negative for behavioral problems, self-injury and suicidal ideas. The patient is not nervous/anxious.     Vital Signs: BP 119/71   Pulse 72   Temp 98.2 F (36.8 C)   Resp 16   Ht '5\' 5"'$  (1.651 m)   Wt 164 lb (74.4 kg)   SpO2 99%   BMI 27.29 kg/m    Physical Exam Vitals reviewed.  Constitutional:      General: She is awake. She is not in acute  distress.    Appearance: Normal appearance. She is well-developed, well-groomed and normal weight. She is not ill-appearing or diaphoretic.  HENT:     Head: Normocephalic and atraumatic.     Right Ear: Tympanic membrane, ear canal and external ear normal.     Left Ear: Tympanic membrane, ear canal and external ear normal.     Nose: Nose normal. No congestion or rhinorrhea.     Mouth/Throat:     Lips: Pink.     Mouth: Mucous membranes are moist.     Pharynx: Oropharynx is clear. Uvula midline. No oropharyngeal exudate or posterior oropharyngeal erythema.  Eyes:     General: Lids are normal. Vision grossly intact. Gaze aligned appropriately. No scleral icterus.       Right eye: No discharge.        Left eye: No discharge.     Extraocular Movements: Extraocular movements intact.     Conjunctiva/sclera: Conjunctivae normal.     Pupils: Pupils are equal, round, and reactive to light.     Funduscopic exam:    Right eye: Red reflex present.        Left eye: Red reflex present. Neck:     Thyroid: No thyromegaly.     Vascular: No JVD.     Trachea: No tracheal deviation.  Cardiovascular:     Rate and Rhythm: Normal rate and regular rhythm.     Pulses: Normal pulses.     Heart sounds: Normal heart sounds, S1 normal and S2 normal. No murmur heard.    No friction rub. No gallop.  Pulmonary:     Effort: No accessory muscle usage or respiratory distress.     Breath sounds: Normal breath sounds and air entry. No stridor. No wheezing or rales.  Chest:     Chest wall: No tenderness.  Breasts:    Right: Normal. No swelling, bleeding, inverted nipple, mass, nipple discharge,  skin change or tenderness.     Left: Normal. No swelling, bleeding, inverted nipple, mass, nipple discharge, skin change or tenderness.  Abdominal:     General: Bowel sounds are normal. There is no distension.     Palpations: Abdomen is soft. There is no shifting dullness, fluid wave, mass or pulsatile mass.     Tenderness:  There is no abdominal tenderness. There is no guarding or rebound.  Musculoskeletal:        General: No tenderness or deformity. Normal range of motion.     Cervical back: Normal range of motion and neck supple.     Right lower leg: No edema.     Left lower leg: No edema.  Lymphadenopathy:     Cervical: No cervical adenopathy.     Upper Body:     Right upper body: No supraclavicular, axillary or pectoral adenopathy.     Left upper body: No supraclavicular, axillary or pectoral adenopathy.  Skin:    General: Skin is warm and dry.     Capillary Refill: Capillary refill takes less than 2 seconds.     Coloration: Skin is not pale.     Findings: No erythema or rash.  Neurological:     Mental Status: She is alert and oriented to person, place, and time.     Cranial Nerves: No cranial nerve deficit.     Motor: No abnormal muscle tone.     Coordination: Coordination normal.     Gait: Gait normal.     Deep Tendon Reflexes: Reflexes are normal and symmetric.  Psychiatric:        Mood and Affect: Mood and affect normal.        Behavior: Behavior normal. Behavior is cooperative.        Thought Content: Thought content normal.        Judgment: Judgment normal.        Assessment/Plan: 1. Encounter for routine adult health examination with abnormal findings Age-appropriate preventive screenings and vaccinations discussed, annual physical exam completed. Routine labs for health maintenance ordered, patient reminded. PHM updated.   2. Vertigo Stable , meclizine as needed per order - meclizine (ANTIVERT) 25 MG tablet; Take twice daily as needed for dizziness  Dispense: 90 tablet; Refill: 1  3. Dysuria Routine urinalysis done.  - UA/M w/rflx Culture, Routine  4. Medication refill - ALPRAZolam (XANAX) 0.5 MG tablet; TAKE ONE TAB IN AM AND ONE AND HALF AT NIGHT FOR SLEEP ( THIS IS CHANGE IN DIRECTION)  Dispense: 75 tablet; Refill: 2 - buPROPion (WELLBUTRIN SR) 200 MG 12 hr tablet; Take 1  tablet (200 mg total) by mouth 2 (two) times daily.  Dispense: 180 tablet; Refill: 1 - desvenlafaxine (PRISTIQ) 100 MG 24 hr tablet; Take 1 tablet (100 mg total) by mouth daily.  Dispense: 90 tablet; Refill: 1 - levocetirizine (XYZAL) 5 MG tablet; Take 1 tablet (5 mg total) by mouth daily.  Dispense: 90 tablet; Refill: 1 - lisinopril (ZESTRIL) 10 MG tablet; Take 1 tablet (10 mg total) by mouth daily.  Dispense: 90 tablet; Refill: 1  5. Encounter for screening mammogram for malignant neoplasm of breast Routine mammogram ordered - MM 3D SCREEN BREAST BILATERAL; Future     General Counseling: ciin brazzel understanding of the findings of todays visit and agrees with plan of treatment. I have discussed any further diagnostic evaluation that may be needed or ordered today. We also reviewed her medications today. she has been encouraged to call the office with any  questions or concerns that should arise related to todays visit.    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    Dispense:  180 tablet    Refill:  1    For future refill   desvenlafaxine (PRISTIQ) 100 MG 24 hr tablet    Sig: Take 1 tablet (100 mg total) by mouth daily.    Dispense:  90 tablet    Refill:  1    Ok to fill as 90 day prescription, for future refills   levocetirizine (XYZAL) 5 MG tablet    Sig: Take 1 tablet (5 mg total) by mouth daily.    Dispense:  90 tablet    Refill:  1    For future refill   lisinopril (ZESTRIL) 10 MG tablet    Sig: Take 1 tablet (10 mg total) by mouth daily.    Dispense:  90 tablet    Refill:   1    For future refills   meclizine (ANTIVERT) 25 MG tablet    Sig: Take twice daily as needed for dizziness    Dispense:  90 tablet    Refill:  1    Patient will call for refills when she needs them.    Return in about 3 months (around 04/01/2022) for F/U, anxiety med refill, Steadman Prosperi PCP.   Total time spent:30 Minutes Time spent includes review of chart, medications, test results, and follow up plan with the patient.   [place] Controlled Substance Database was reviewed by me.  This patient was seen by Jonetta Osgood, FNP-C in collaboration with Dr. Clayborn Bigness as a part of collaborative care agreement.  Trigg Delarocha R. Valetta Fuller, MSN, FNP-C Internal medicine

## 2022-01-07 LAB — UA/M W/RFLX CULTURE, ROUTINE
Bilirubin, UA: NEGATIVE
Glucose, UA: NEGATIVE
Ketones, UA: NEGATIVE
Leukocytes,UA: NEGATIVE
Nitrite, UA: NEGATIVE
Specific Gravity, UA: 1.019 (ref 1.005–1.030)
Urobilinogen, Ur: 0.2 mg/dL (ref 0.2–1.0)
pH, UA: 6 (ref 5.0–7.5)

## 2022-01-07 LAB — MICROSCOPIC EXAMINATION
Bacteria, UA: NONE SEEN
Casts: NONE SEEN /lpf

## 2022-01-19 ENCOUNTER — Ambulatory Visit (INDEPENDENT_AMBULATORY_CARE_PROVIDER_SITE_OTHER): Payer: Medicare Other | Admitting: Urology

## 2022-01-19 ENCOUNTER — Encounter: Payer: Self-pay | Admitting: Urology

## 2022-01-19 VITALS — BP 147/84 | HR 76 | Ht 65.0 in | Wt 166.0 lb

## 2022-01-19 DIAGNOSIS — N390 Urinary tract infection, site not specified: Secondary | ICD-10-CM | POA: Diagnosis not present

## 2022-01-19 DIAGNOSIS — R3129 Other microscopic hematuria: Secondary | ICD-10-CM

## 2022-01-19 DIAGNOSIS — N3946 Mixed incontinence: Secondary | ICD-10-CM | POA: Diagnosis not present

## 2022-01-19 LAB — URINALYSIS, COMPLETE
Bilirubin, UA: NEGATIVE
Glucose, UA: NEGATIVE
Ketones, UA: NEGATIVE
Nitrite, UA: NEGATIVE
Specific Gravity, UA: 1.025 (ref 1.005–1.030)
Urobilinogen, Ur: 0.2 mg/dL (ref 0.2–1.0)
pH, UA: 5.5 (ref 5.0–7.5)

## 2022-01-19 LAB — MICROSCOPIC EXAMINATION: WBC, UA: >30 /hpf — AB (ref 0–5)

## 2022-01-19 MED ORDER — TRIMETHOPRIM 100 MG PO TABS: 100.0000 mg | ORAL_TABLET | Freq: Every day | ORAL | 3 refills | Status: DC

## 2022-01-19 MED ORDER — SULFAMETHOXAZOLE-TRIMETHOPRIM 800-160 MG PO TABS: 1.0000 | ORAL_TABLET | Freq: Two times a day (BID) | ORAL | 0 refills | Status: AC

## 2022-01-19 NOTE — Progress Notes (Signed)
01/19/2022 8:31 AM   Amanda Mcgee 02/13/1955 [id number]  Referring provider: Jonetta Osgood, NP [place],  [place] [address]  Chief Complaint  Patient presents with   New Patient (Initial Visit)   Recurrent UTI    HPI: I was consulted to assess the patient's recurrent bladder infections.  She said 5 or 6 in the last 6 months.  He presents with burning frequency that respond favorably to antibiotics.  At baseline she has both urge and stress incontinence but equally significant.  She is small-volume bedwetting.  She wears 5-6 pads a day that are damp.  He thinks she might be infected now with mild burning yesterday  She voids every 1 hour and can sit for 2 hours if she did not drink.  Nocturia x 1.  Flow is poor and does not feel empty  She may have had 2 slings in the past.  She has had kidney stones.  She has had a hysterectomy.  She describes significant vaginal narrowing from previous surgery.  She has a past history of microhematuria.  Has a smoking history.  No daily aspirin or blood thinner.  She was treated here locally years ago with medication by Dr. Jacqlyn Larsen  Needs laxatives for constipation.  No neurologic issues   PMH: Past Medical History:  Diagnosis Date   Anxiety    Arthritis    Depression    HTN (hypertension)    Hyperlipidemia    Kidney stones 2013    Surgical History: Past Surgical History:  Procedure Laterality Date   [place]   BLADDER SURGERY  2011   BREAST BIOPSY Left    benign   COLONOSCOPY WITH PROPOFOL N/A 02/01/2018   Procedure: COLONOSCOPY WITH PROPOFOL;  Surgeon: [name], MD;  Location: [facility] ENDOSCOPY;  Service: Gastroenterology;  Laterality: N/A;   LITHOTRIPSY  06/2011    Home Medications:  Allergies as of 01/19/2022       Reactions   Benadryl [diphenhydramine Hcl] Swelling   Biaxin [clarithromycin]    Ciprofloxacin    Codeine Swelling   Hydrochlorothiazide    Macrobid  [nitrofurantoin] Nausea And Vomiting   Penicillins    Upset stomach   Prednisone    Aspirin Nausea Only        Medication List        Accurate as of January 19, 2022  8:31 AM. If you have any questions, ask your nurse or doctor.          albuterol 108 (90 Base) MCG/ACT inhaler Commonly known as: VENTOLIN HFA TAKE 2 PUFFS BY MOUTH EVERY 6 HOURS AS NEEDED FOR WHEEZE OR SHORTNESS OF BREATH   ALPRAZolam 0.5 MG tablet Commonly known as: XANAX TAKE ONE TAB IN AM AND ONE AND HALF AT NIGHT FOR SLEEP ( THIS IS CHANGE IN DIRECTION)   amLODipine 5 MG tablet Commonly known as: NORVASC Take 1 tablet (5 mg total) by mouth daily.   buPROPion 200 MG 12 hr tablet Commonly known as: WELLBUTRIN SR Take 1 tablet (200 mg total) by mouth 2 (two) times daily.   calcium carbonate 600 MG Tabs tablet Commonly known as: OS-CAL Take 600 mg by mouth 2 (two) times daily with a meal.   clotrimazole-betamethasone cream Commonly known as: LOTRISONE Apply 1 application topically 2 (two) times daily.   CRANBERRY PO Take 1 tablet by mouth 2 (two) times daily.   desvenlafaxine 100 MG 24 hr tablet Commonly known as: PRISTIQ  Take 1 tablet (100 mg total) by mouth daily.   docusate sodium 100 MG capsule Commonly known as: COLACE Take 100 mg by mouth 2 (two) times daily. prn   fluticasone 50 MCG/ACT nasal spray Commonly known as: FLONASE SPRAY 1 SPRAY INTO BOTH NOSTRILS DAILY.   hydrocortisone 2.5 % cream Apply to affected areas Tuesday, Thursday and Saturday at bedtime. PRN only   ketoconazole 2 % cream Commonly known as: NIZORAL Apply to affected areas Monday, Wednesday and Friday at bedtime   levocetirizine 5 MG tablet Commonly known as: XYZAL Take 1 tablet (5 mg total) by mouth daily.   lisinopril 10 MG tablet Commonly known as: ZESTRIL Take 1 tablet (10 mg total) by mouth daily.   meclizine 25 MG tablet Commonly known as: ANTIVERT Take twice daily as needed for dizziness    Vitamin D3 10 MCG (400 UNIT) Caps Take by mouth daily.        Allergies:  Allergies  Allergen Reactions   Benadryl [Diphenhydramine Hcl] Swelling   Biaxin [Clarithromycin]    Ciprofloxacin    Codeine Swelling   Hydrochlorothiazide    Macrobid [Nitrofurantoin] Nausea And Vomiting   Penicillins     Upset stomach    Prednisone    Aspirin Nausea Only    Family History: Family History  Problem Relation Age of Onset   Breast cancer Mother 12   Heart attack Son     Social History:  reports that she has been smoking cigarettes. She has a 15.00 pack-year smoking history. She has been exposed to tobacco smoke. She has never used smokeless tobacco. She reports current alcohol use. She reports that she does not use drugs.  ROS:                                        Physical Exam: BP (!) 147/84   Pulse 76   Ht '5\' 5"'$  (1.651 m)   Wt 75.3 kg   BMI 27.62 kg/m   Constitutional:  Alert and oriented, No acute distress. HEENT: [place] AT, moist mucus membranes.  Trachea midline, no masses. Cardiovascular: No clubbing, cyanosis, or edema. Respiratory: Normal respiratory effort, no increased work of breathing. GI: Abdomen is soft, nontender, nondistended, no abdominal masses GU: No CVA tenderness.  No distress Skin: No rashes, bruises or suspicious lesions. Lymph: No cervical or inguinal adenopathy. Neurologic: Grossly intact, no focal deficits, moving all 4 extremities. Psychiatric: Normal mood and affect.  Laboratory Data: Lab Results  Component Value Date   WBC 7.7 10/21/2020   HGB 14.2 10/21/2020   HCT 41.2 10/21/2020   MCV 91 10/21/2020   PLT 198 10/21/2020    Lab Results  Component Value Date   CREATININE 0.81 10/21/2020    No results found for: "PSA"  No results found for: "TESTOSTERONE"  No results found for: "HGBA1C"  Urinalysis    Component Value Date/Time   COLORURINE Straw 08/06/2011 1810   COLORURINE YELLOW 05/09/2010 2235    APPEARANCEUR Clear 01/06/2022 1537   LABSPEC 1.023 08/06/2011 1810   PHURINE 7.0 08/06/2011 1810   PHURINE 6.5 05/09/2010 2235   GLUCOSEU Negative 01/06/2022 1537   GLUCOSEU Negative 08/06/2011 1810   HGBUR 3+ 08/06/2011 1810   HGBUR LARGE (A) 05/09/2010 2235   BILIRUBINUR Negative 01/06/2022 1537   BILIRUBINUR Negative 08/06/2011 1810   KETONESUR Negative 08/06/2011 1810   KETONESUR NEGATIVE 05/09/2010 [place]  Trace 01/06/2022 1537   PROTEINUR Negative 08/06/2011 1810   PROTEINUR 100 (A) 05/09/2010 2235   UROBILINOGEN 0.2 04/30/2020 1020   UROBILINOGEN 1.0 05/09/2010 2235   NITRITE Negative 01/06/2022 1537   NITRITE Negative 08/06/2011 1810   NITRITE NEGATIVE 05/09/2010 2235   LEUKOCYTESUR Negative 01/06/2022 1537   LEUKOCYTESUR 3+ 08/06/2011 1810    Pertinent Imaging: Urine positive.  Urine sent for culture.  Blood cells in urine  Assessment & Plan: Patient has recurrent urinary tract infections.  Pathophysiology and workup described.  She has mixed incontinence with frequency and nocturia.  She understands that we will sort out her bladder infections and then review baseline her incontinence moving forward.  Will return with a CT scan and for pelvic examination and cystoscopy.  I called in Septra DS 1 tablet twice a day for 7 days.  Then she will start daily trimethoprim.  Call if CAT scan abnormal.  See in 6 or 7 weeks for pelvic semination cystoscopy.  1. Recurrent UTI  - Urinalysis, Complete - CULTURE, URINE COMPREHENSIVE   No follow-ups on file.  Reece Packer, MD  [facility] [address] [place], [place] [address] [phone]

## 2022-01-22 LAB — CULTURE, URINE COMPREHENSIVE

## 2022-02-04 LAB — CMP14+EGFR
ALT: 13 IU/L (ref 0–32)
AST: 9 IU/L (ref 0–40)
Albumin/Globulin Ratio: 2.2 (ref 1.2–2.2)
Albumin: 4.3 g/dL (ref 3.9–4.9)
Alkaline Phosphatase: 89 IU/L (ref 44–121)
BUN/Creatinine Ratio: 13 (ref 12–28)
BUN: 11 mg/dL (ref 8–27)
Bilirubin Total: 0.2 mg/dL (ref 0.0–1.2)
CO2: 25 mmol/L (ref 20–29)
Calcium: 9.3 mg/dL (ref 8.7–10.3)
Chloride: 101 mmol/L (ref 96–106)
Creatinine, Ser: 0.82 mg/dL (ref 0.57–1.00)
Globulin, Total: 2 g/dL (ref 1.5–4.5)
Glucose: 114 mg/dL — ABNORMAL HIGH (ref 70–99)
Potassium: 4.4 mmol/L (ref 3.5–5.2)
Sodium: 138 mmol/L (ref 134–144)
Total Protein: 6.3 g/dL (ref 6.0–8.5)
eGFR: 78 mL/min/{1.73_m2} (ref >59)

## 2022-02-04 LAB — CBC WITH DIFFERENTIAL/PLATELET
Basophils Absolute: 0 10*3/uL (ref 0.0–0.2)
Basos: 1 %
EOS (ABSOLUTE): 0.1 10*3/uL (ref 0.0–0.4)
Eos: 2 %
Hematocrit: 40.5 % (ref 34.0–46.6)
Hemoglobin: 14 g/dL (ref 11.1–15.9)
Immature Grans (Abs): 0 10*3/uL (ref 0.0–0.1)
Immature Granulocytes: 1 %
Lymphocytes Absolute: 1.9 10*3/uL (ref 0.7–3.1)
Lymphs: 29 %
MCH: 31.7 pg (ref 26.6–33.0)
MCHC: 34.6 g/dL (ref 31.5–35.7)
MCV: 92 fL (ref 79–97)
Monocytes Absolute: 0.6 10*3/uL (ref 0.1–0.9)
Monocytes: 9 %
Neutrophils Absolute: 3.9 10*3/uL (ref 1.4–7.0)
Neutrophils: 58 %
Platelets: 212 10*3/uL (ref 150–450)
RBC: 4.42 x10E6/uL (ref 3.77–5.28)
RDW: 11.9 % (ref 11.7–15.4)
WBC: 6.5 10*3/uL (ref 3.4–10.8)

## 2022-02-04 LAB — TSH+FREE T4
Free T4: 0.78 ng/dL — ABNORMAL LOW (ref 0.82–1.77)
TSH: 2.83 u[IU]/mL (ref 0.450–4.500)

## 2022-02-04 LAB — LIPID PANEL
Chol/HDL Ratio: 4.5 ratio — ABNORMAL HIGH (ref 0.0–4.4)
Cholesterol, Total: 207 mg/dL — ABNORMAL HIGH (ref 100–199)
HDL: 46 mg/dL (ref >39)
LDL Chol Calc (NIH): 132 mg/dL — ABNORMAL HIGH (ref 0–99)
Triglycerides: 162 mg/dL — ABNORMAL HIGH (ref 0–149)
VLDL Cholesterol Cal: 29 mg/dL (ref 5–40)

## 2022-02-04 LAB — VITAMIN D 25 HYDROXY (VIT D DEFICIENCY, FRACTURES): Vit D, 25-Hydroxy: 28.3 ng/mL — ABNORMAL LOW (ref 30.0–100.0)

## 2022-02-19 ENCOUNTER — Ambulatory Visit
Admission: RE | Admit: 2022-02-19 | Discharge: 2022-02-19 | Disposition: A | Discharge status: Home / Self Care | Payer: Medicare Other | Source: Ambulatory Visit | Attending: Urology | Admitting: Urology

## 2022-02-19 DIAGNOSIS — N3946 Mixed incontinence: Secondary | ICD-10-CM | POA: Diagnosis present

## 2022-02-19 DIAGNOSIS — R3129 Other microscopic hematuria: Secondary | ICD-10-CM | POA: Insufficient documentation

## 2022-02-19 DIAGNOSIS — N390 Urinary tract infection, site not specified: Secondary | ICD-10-CM | POA: Insufficient documentation

## 2022-02-19 MED ORDER — IOHEXOL 300 MG/ML  SOLN
100.0000 mL | Freq: Once | INTRAMUSCULAR | Status: AC | PRN
  Administered 2022-02-19: 100 mL via INTRAVENOUS

## 2022-02-26 ENCOUNTER — Other Ambulatory Visit: Payer: Self-pay | Admitting: Nurse Practitioner

## 2022-02-26 DIAGNOSIS — Z760 Encounter for issue of repeat prescription: Secondary | ICD-10-CM

## 2022-02-26 NOTE — Telephone Encounter (Signed)
Last 12/23 and 03/16/21

## 2022-03-02 ENCOUNTER — Other Ambulatory Visit: Payer: Medicare Other | Admitting: Urology

## 2022-03-09 ENCOUNTER — Ambulatory Visit (INDEPENDENT_AMBULATORY_CARE_PROVIDER_SITE_OTHER): Payer: Medicare Other | Admitting: Urology

## 2022-03-09 ENCOUNTER — Encounter: Payer: Self-pay | Admitting: Urology

## 2022-03-09 VITALS — BP 133/82 | HR 77 | Ht 65.0 in | Wt 161.0 lb

## 2022-03-09 DIAGNOSIS — N390 Urinary tract infection, site not specified: Secondary | ICD-10-CM

## 2022-03-09 LAB — URINALYSIS, COMPLETE
Bilirubin, UA: NEGATIVE
Glucose, UA: NEGATIVE
Ketones, UA: NEGATIVE
Leukocytes,UA: NEGATIVE
Nitrite, UA: NEGATIVE
Protein,UA: NEGATIVE
Specific Gravity, UA: 1.02 (ref 1.005–1.030)
Urobilinogen, Ur: 0.2 mg/dL (ref 0.2–1.0)
pH, UA: 7 (ref 5.0–7.5)

## 2022-03-09 LAB — MICROSCOPIC EXAMINATION

## 2022-03-09 MED ORDER — CEPHALEXIN 250 MG PO CAPS: 250.0000 mg | ORAL_CAPSULE | Freq: Every day | ORAL | 11 refills | Status: DC

## 2022-03-09 NOTE — Addendum Note (Signed)
Addended by: Despina Hidden on: 03/09/2022 10:12 AM   Modules accepted: Orders

## 2022-03-09 NOTE — Progress Notes (Addendum)
03/09/2022 9:40 AM   Amanda Mcgee 20-Jan-1955 [id number]  Referring provider: Jonetta Osgood, NP [place],  [place] [address]  No chief complaint on file.   HPI: I was consulted to assess the patient's recurrent bladder infections.  She said 5 or 6 in the last 6 months.  She presents with burning frequency that respond favorably to antibiotics.   At baseline she has both urge and stress incontinence but equally significant.  She is small-volume bedwetting.  She wears 5-6 pads a day that are damp.  He thinks she might be infected now with mild burning yesterday   She voids every 1 hour and can sit for 2 hours if she did not drink.  Nocturia x 1.  Flow is poor and does not feel empty   She may have had 2 slings in the past.  She has had kidney stones.  She has had a hysterectomy.  She describes significant vaginal narrowing from previous surgery.   She has a past history of microhematuria.  Has a smoking history.  No daily aspirin or blood thinner.   She was treated here locally years ago with medication by Dr. Jacqlyn Larsen    Patient has recurrent urinary tract infections. Pathophysiology and workup described. She has mixed incontinence with frequency and nocturia. She understands that we will sort out her bladder infections and then review baseline her incontinence moving forward. Will return with a CT scan and for pelvic examination and cystoscopy. I called in Septra DS 1 tablet twice a day for 7 days. Then she will start daily trimethoprim. Call if CT scan abnormal. See in 6 or 7 weeks for pelvic examination cystoscopy.   Today Frequency stable.  Last culture was positive.  CT scan within normal limits Clinically not infected on trimethoprim but is having some gas.  On pelvic examination she did have a narrow introitus.  Visually her anatomy looked reasonably normal but when I gently placed a narrow well-lubricated speculum it was uncomfortable.  At 6:00 of the posterior  fourchette she had a mild separation or cut of the epithelium.  I did not do a thorough pelvic examination but she had a well supported bladder neck and leaked a mild amount after cystoscopy.  She did not have hypermobility.  I did not see or feel any mesh.  She had no significant prolapse  Cystoscopy.  Bladder mucosa and trigone were normal.  No foreign body.  No carcinoma.  Urine was clear.    PMH: Past Medical History:  Diagnosis Date   Anxiety    Arthritis    Depression    HTN (hypertension)    Hyperlipidemia    Kidney stones 2013    Surgical History: Past Surgical History:  Procedure Laterality Date   [place]   BLADDER SURGERY  2011   BREAST BIOPSY Left    benign   COLONOSCOPY WITH PROPOFOL N/A 02/01/2018   Procedure: COLONOSCOPY WITH PROPOFOL;  Surgeon: [name], MD;  Location: [facility] ENDOSCOPY;  Service: Gastroenterology;  Laterality: N/A;   LITHOTRIPSY  06/2011    Home Medications:  Allergies as of 03/09/2022       Reactions   Benadryl [diphenhydramine Hcl] Swelling   Biaxin [clarithromycin]    Ciprofloxacin    Codeine Swelling   Hydrochlorothiazide    Macrobid [nitrofurantoin] Nausea And Vomiting   Penicillins    Upset stomach   Prednisone    Aspirin Nausea Only  Medication List        Accurate as of March 09, 2022  9:40 AM. If you have any questions, ask your nurse or doctor.          albuterol 108 (90 Base) MCG/ACT inhaler Commonly known as: VENTOLIN HFA TAKE 2 PUFFS BY MOUTH EVERY 6 HOURS AS NEEDED FOR WHEEZE OR SHORTNESS OF BREATH   ALPRAZolam 0.5 MG tablet Commonly known as: XANAX TAKE ONE TAB IN MORNING AND ONE AND HALF AT NIGHT FOR SLEEP ( THIS IS CHANGE IN DIRECTION)   amLODipine 5 MG tablet Commonly known as: NORVASC Take 1 tablet (5 mg total) by mouth daily.   buPROPion 200 MG 12 hr tablet Commonly known as: WELLBUTRIN SR Take 1 tablet (200 mg total) by mouth 2 (two) times  daily.   calcium carbonate 600 MG Tabs tablet Commonly known as: OS-CAL Take 600 mg by mouth 2 (two) times daily with a meal.   CRANBERRY PO Take 1 tablet by mouth 2 (two) times daily.   desvenlafaxine 100 MG 24 hr tablet Commonly known as: PRISTIQ Take 1 tablet (100 mg total) by mouth daily.   docusate sodium 100 MG capsule Commonly known as: COLACE Take 100 mg by mouth 2 (two) times daily. prn   fluticasone 50 MCG/ACT nasal spray Commonly known as: FLONASE SPRAY 1 SPRAY INTO BOTH NOSTRILS DAILY.   levocetirizine 5 MG tablet Commonly known as: XYZAL Take 1 tablet (5 mg total) by mouth daily.   lisinopril 10 MG tablet Commonly known as: ZESTRIL Take 1 tablet (10 mg total) by mouth daily.   meclizine 25 MG tablet Commonly known as: ANTIVERT Take twice daily as needed for dizziness   trimethoprim 100 MG tablet Commonly known as: TRIMPEX Take 1 tablet (100 mg total) by mouth daily.   Vitamin D3 10 MCG (400 UNIT) Caps Generic drug: Cholecalciferol Take by mouth daily.        Allergies:  Allergies  Allergen Reactions   Benadryl [Diphenhydramine Hcl] Swelling   Biaxin [Clarithromycin]    Ciprofloxacin    Codeine Swelling   Hydrochlorothiazide    Macrobid [Nitrofurantoin] Nausea And Vomiting   Penicillins     Upset stomach    Prednisone    Aspirin Nausea Only    Family History: Family History  Problem Relation Age of Onset   Breast cancer Mother 20   Heart attack Son     Social History:  reports that she has been smoking cigarettes. She has a 15.00 pack-year smoking history. She has been exposed to tobacco smoke. She has never used smokeless tobacco. She reports current alcohol use. She reports that she does not use drugs.  ROS:                                        Physical Exam: BP 133/82   Pulse 77   Ht '5\' 5"'$  (1.651 m)   Wt 73 kg   BMI 26.79 kg/m   Constitutional:  Alert and oriented, No acute distress. HEENT: [place]  AT, moist mucus membranes.  Trachea midline, no masses.   Laboratory Data: Lab Results  Component Value Date   WBC 6.5 02/03/2022   HGB 14.0 02/03/2022   HCT 40.5 02/03/2022   MCV 92 02/03/2022   PLT 212 02/03/2022    Lab Results  Component Value Date   CREATININE 0.82 02/03/2022    No results  found for: "PSA"  No results found for: "TESTOSTERONE"  No results found for: "HGBA1C"  Urinalysis    Component Value Date/Time   COLORURINE Straw 08/06/2011 1810   COLORURINE YELLOW 05/09/2010 2235   APPEARANCEUR [place] (A) 01/19/2022 0828   LABSPEC 1.023 08/06/2011 1810   PHURINE 7.0 08/06/2011 1810   PHURINE 6.5 05/09/2010 2235   GLUCOSEU Negative 01/19/2022 0828   GLUCOSEU Negative 08/06/2011 1810   HGBUR 3+ 08/06/2011 1810   HGBUR LARGE (A) 05/09/2010 2235   BILIRUBINUR Negative 01/19/2022 0828   BILIRUBINUR Negative 08/06/2011 1810   KETONESUR Negative 08/06/2011 1810   KETONESUR NEGATIVE 05/09/2010 2235   PROTEINUR 1+ (A) 01/19/2022 0828   PROTEINUR Negative 08/06/2011 1810   PROTEINUR 100 (A) 05/09/2010 2235   UROBILINOGEN 0.2 04/30/2020 1020   UROBILINOGEN 1.0 05/09/2010 2235   NITRITE Negative 01/19/2022 0828   NITRITE Negative 08/06/2011 1810   NITRITE NEGATIVE 05/09/2010 2235   LEUKOCYTESUR 1+ (A) 01/19/2022 0828   LEUKOCYTESUR 3+ 08/06/2011 1810    Pertinent Imaging:   Assessment & Plan: I patient has recurrent bladder infections.  Because of the bloating or gas I switch her to Keflex 250 mg daily 30 x 11 and would like to reassess her in about 8 weeks.  I would see if her incontinence down regulates and set up treatment goals.  She had mild stress incontinence.  Vaginal access would be difficult especially for a vaginal sling.  A vaginal bulking agent under anesthesia would be an option for stress incontinence.  She does have mixed incontinence and bedwetting.  Call if culture positive.  She has been cleared for blood in the urine.  I have not yet reviewed  urodynamics with her.  She does have a lot of allergies and cannot take Macrodantin.  I answered many questions about her CT scan report  1. Recurrent UTI  - Urinalysis, Complete   No follow-ups on file.  Reece Packer, MD  [facility] [address] [place], [place] [address] [phone]

## 2022-03-27 ENCOUNTER — Ambulatory Visit
Admission: RE | Admit: 2022-03-27 | Discharge: 2022-03-27 | Disposition: A | Discharge status: Home / Self Care | Payer: Medicare Other | Source: Ambulatory Visit | Attending: Nurse Practitioner | Admitting: Nurse Practitioner

## 2022-03-27 DIAGNOSIS — Z1231 Encounter for screening mammogram for malignant neoplasm of breast: Secondary | ICD-10-CM | POA: Insufficient documentation

## 2022-04-03 ENCOUNTER — Ambulatory Visit (INDEPENDENT_AMBULATORY_CARE_PROVIDER_SITE_OTHER): Payer: Medicare Other | Admitting: Nurse Practitioner

## 2022-04-03 ENCOUNTER — Encounter: Payer: Self-pay | Admitting: Nurse Practitioner

## 2022-04-03 VITALS — BP 147/82 | HR 83 | Temp 96.6°F | Resp 16 | Ht 65.0 in | Wt 168.0 lb

## 2022-04-03 DIAGNOSIS — H6123 Impacted cerumen, bilateral: Secondary | ICD-10-CM

## 2022-04-03 DIAGNOSIS — Z79899 Other long term (current) drug therapy: Secondary | ICD-10-CM

## 2022-04-03 DIAGNOSIS — F411 Generalized anxiety disorder: Secondary | ICD-10-CM

## 2022-04-03 DIAGNOSIS — I10 Essential (primary) hypertension: Secondary | ICD-10-CM

## 2022-04-03 DIAGNOSIS — N3281 Overactive bladder: Secondary | ICD-10-CM | POA: Diagnosis not present

## 2022-04-03 DIAGNOSIS — Z760 Encounter for issue of repeat prescription: Secondary | ICD-10-CM

## 2022-04-03 LAB — POCT URINE DRUG SCREEN
Methylenedioxyamphetamine: NOT DETECTED
POC Amphetamine UR: NOT DETECTED — AB
POC BENZODIAZEPINES UR: POSITIVE — AB
POC Barbiturate UR: NOT DETECTED
POC Cocaine UR: NOT DETECTED
POC Ecstasy UR: NOT DETECTED
POC Marijuana UR: NOT DETECTED
POC Methadone UR: NOT DETECTED
POC Methamphetamine UR: NOT DETECTED
POC Opiate Ur: NOT DETECTED
POC Oxycodone UR: NOT DETECTED
POC PHENCYCLIDINE UR: NOT DETECTED
POC TRICYCLICS UR: NOT DETECTED

## 2022-04-03 MED ORDER — LISINOPRIL 10 MG PO TABS: 10.0000 mg | ORAL_TABLET | Freq: Every day | ORAL | 1 refills | Status: DC

## 2022-04-03 MED ORDER — ALPRAZOLAM 0.5 MG PO TABS: ORAL_TABLET | ORAL | 2 refills | Status: DC

## 2022-04-03 MED ORDER — LEVOCETIRIZINE DIHYDROCHLORIDE 5 MG PO TABS: 5.0000 mg | ORAL_TABLET | Freq: Every day | ORAL | 1 refills | Status: DC

## 2022-04-03 MED ORDER — OXYBUTYNIN CHLORIDE ER 5 MG PO TB24: 5.0000 mg | ORAL_TABLET | Freq: Every day | ORAL | 2 refills | Status: DC

## 2022-04-03 MED ORDER — BUPROPION HCL ER (SR) 200 MG PO TB12: 200.0000 mg | ORAL_TABLET | Freq: Two times a day (BID) | ORAL | 1 refills | Status: DC

## 2022-04-03 MED ORDER — DESVENLAFAXINE SUCCINATE ER 100 MG PO TB24: 100.0000 mg | ORAL_TABLET | Freq: Every day | ORAL | 1 refills | Status: DC

## 2022-04-03 MED ORDER — AMLODIPINE BESYLATE 5 MG PO TABS: 5.0000 mg | ORAL_TABLET | Freq: Every day | ORAL | 3 refills | Status: DC

## 2022-04-03 NOTE — Progress Notes (Signed)
[facility] [place], [place] [address]  Internal MEDICINE  Office Visit Note  Patient Name: Amanda Mcgee  [id number]  [id number]  Date of Service: 04/03/2022  Chief Complaint  Patient presents with   Follow-up   Hypertension   Hyperlipidemia   Depression    HPI Maryrose presents for a follow-up visit for refills, impacted wax and anxiety.  Overactive bladder -- wants to try medication prior to going on her trip to the [place] next month.  Anxiety -- due for refills of alprazolam. Still effective for her anxiety. No issues Bilateral impacted cerumen -- tried ear washing kit at home but was unsuccessful. Has been using the drops to soften the wax. Wants to get ear wash today at clinic.     Current Medication: Outpatient Encounter Medications as of 04/03/2022  Medication Sig   albuterol (VENTOLIN HFA) 108 (90 Base) MCG/ACT inhaler TAKE 2 PUFFS BY MOUTH EVERY 6 HOURS AS NEEDED FOR WHEEZE OR SHORTNESS OF BREATH   calcium carbonate (OS-CAL) 600 MG TABS tablet Take 600 mg by mouth 2 (two) times daily with a meal.   cephALEXin (KEFLEX) 250 MG capsule Take 1 capsule (250 mg total) by mouth daily.   Cholecalciferol (VITAMIN D3) 400 units CAPS Take by mouth daily.   CRANBERRY PO Take 1 tablet by mouth 2 (two) times daily.   docusate sodium (COLACE) 100 MG capsule Take 100 mg by mouth 2 (two) times daily. prn   fluticasone (FLONASE) 50 MCG/ACT nasal spray SPRAY 1 SPRAY INTO BOTH NOSTRILS DAILY.   meclizine (ANTIVERT) 25 MG tablet Take twice daily as needed for dizziness   oxybutynin (DITROPAN-XL) 5 MG 24 hr tablet Take 1 tablet (5 mg total) by mouth at bedtime.   [DISCONTINUED] ALPRAZolam (XANAX) 0.5 MG tablet TAKE ONE TAB IN MORNING AND ONE AND HALF AT NIGHT FOR SLEEP ( THIS IS CHANGE IN DIRECTION)   [DISCONTINUED] amLODipine (NORVASC) 5 MG tablet Take 1 tablet (5 mg total) by mouth daily.   [DISCONTINUED] buPROPion (WELLBUTRIN SR) 200 MG 12 hr tablet  Take 1 tablet (200 mg total) by mouth 2 (two) times daily.   [DISCONTINUED] desvenlafaxine (PRISTIQ) 100 MG 24 hr tablet Take 1 tablet (100 mg total) by mouth daily.   [DISCONTINUED] levocetirizine (XYZAL) 5 MG tablet Take 1 tablet (5 mg total) by mouth daily.   [DISCONTINUED] lisinopril (ZESTRIL) 10 MG tablet Take 1 tablet (10 mg total) by mouth daily.   [DISCONTINUED] trimethoprim (TRIMPEX) 100 MG tablet Take 1 tablet (100 mg total) by mouth daily.   ALPRAZolam (XANAX) 0.5 MG tablet TAKE ONE TAB IN MORNING AND ONE AND HALF AT NIGHT FOR SLEEP ( THIS IS CHANGE IN DIRECTION)   amLODipine (NORVASC) 5 MG tablet Take 1 tablet (5 mg total) by mouth daily.   buPROPion (WELLBUTRIN SR) 200 MG 12 hr tablet Take 1 tablet (200 mg total) by mouth 2 (two) times daily.   desvenlafaxine (PRISTIQ) 100 MG 24 hr tablet Take 1 tablet (100 mg total) by mouth daily.   levocetirizine (XYZAL) 5 MG tablet Take 1 tablet (5 mg total) by mouth daily.   lisinopril (ZESTRIL) 10 MG tablet Take 1 tablet (10 mg total) by mouth daily.   No facility-administered encounter medications on file as of 04/03/2022.    Surgical History: Past Surgical History:  Procedure Laterality Date   [place]   BREAST BIOPSY Left  benign   COLONOSCOPY WITH PROPOFOL N/A 02/01/2018   Procedure: COLONOSCOPY WITH PROPOFOL;  Surgeon: [name], MD;  Location: [facility] ENDOSCOPY;  Service: Gastroenterology;  Laterality: N/A;   LITHOTRIPSY  06/2011    Medical History: Past Medical History:  Diagnosis Date   Anxiety    Arthritis    Depression    HTN (hypertension)    Hyperlipidemia    Kidney stones 2013    Family History: Family History  Problem Relation Age of Onset   Breast cancer Mother 63   Heart attack Son     Social History   Socioeconomic History   Marital status: Married    Spouse name: Not on file   Number of children: Not on file   Years of education:  Not on file   Highest education level: Not on file  Occupational History   Not on file  Tobacco Use   Smoking status: Every Day    Packs/day: 0.50    Years: 30.00    Additional pack years: 0.00    Total pack years: 15.00    Types: Cigarettes    Passive exposure: Current   Smokeless tobacco: Never   Tobacco comments:    1/2 pack daily  Vaping Use   Vaping Use: Never used  Substance and Sexual Activity   Alcohol use: Yes    Comment: occassionally   Drug use: No   Sexual activity: Never  Other Topics Concern   Not on file  Social History Narrative   Not on file   Social Determinants of Health   Financial Resource Strain: Not on file  Food Insecurity: Not on file  Transportation Needs: Not on file  Physical Activity: Not on file  Stress: Not on file  Social Connections: Not on file  Intimate Partner Violence: Not on file      Review of Systems  Constitutional:  Negative for chills, fatigue and unexpected weight change.  HENT:  Negative for congestion, rhinorrhea, sneezing and sore throat.   Eyes:  Negative for redness.  Respiratory:  Negative for cough, chest tightness and shortness of breath.   Cardiovascular:  Negative for chest pain and palpitations.  Gastrointestinal:  Negative for abdominal pain, constipation, diarrhea, nausea and vomiting.  Genitourinary:  Negative for dysuria and frequency.  Musculoskeletal:  Negative for arthralgias, back pain, joint swelling and neck pain.  Skin:  Negative for rash.  Neurological: Negative.  Negative for tremors and numbness.  Hematological:  Negative for adenopathy. Does not bruise/bleed easily.  Psychiatric/Behavioral:  Negative for behavioral problems (Depression), self-injury, sleep disturbance and suicidal ideas. The patient is nervous/anxious.     Vital Signs: BP (!) 147/82   Pulse 83   Temp (!) 96.6 F (35.9 C)   Resp 16   Ht 5\' 5"  (1.651 m)   Wt 168 lb (76.2 kg)   SpO2 98%   BMI 27.96 kg/m    Physical  Exam Constitutional:      General: She is not in acute distress.    Appearance: Normal appearance. She is obese. She is not ill-appearing.  HENT:     Head: Normocephalic and atraumatic.     Right Ear: There is impacted cerumen.     Left Ear: There is impacted cerumen.  Eyes:     Pupils: Pupils are equal, round, and reactive to light.  Cardiovascular:     Rate and Rhythm: Normal rate and regular rhythm.  Pulmonary:     Effort: Pulmonary effort is normal. No respiratory distress.  Neurological:     Mental Status: She is alert and oriented to person, place, and time.  Psychiatric:        Mood and Affect: Mood normal.        Behavior: Behavior normal.        Assessment/Plan: 1. Essential hypertension Stable, continue lisinopril amlodipine as prescribed.  - lisinopril (ZESTRIL) 10 MG tablet; Take 1 tablet (10 mg total) by mouth daily.  Dispense: 90 tablet; Refill: 1 - amLODipine (NORVASC) 5 MG tablet; Take 1 tablet (5 mg total) by mouth daily.  Dispense: 90 tablet; Refill: 3  2. Bilateral impacted cerumen Bilateral ear lavage done, right ear was successfully cleared, the left ear remained impacted so an ENT referral was ordered.  - Ear Lavage - Ambulatory referral to ENT  3. Overactive bladder Start oxybutynin as prescribed. She has a urology appointment in April.  - oxybutynin (DITROPAN-XL) 5 MG 24 hr tablet; Take 1 tablet (5 mg total) by mouth at bedtime.  Dispense: 30 tablet; Refill: 2  4. Encounter for long-term (current) drug use UDS done, consistent with alprazolam prescription. Medications reviewed and other medication refills ordered - POCT Urine Drug Screen - levocetirizine (XYZAL) 5 MG tablet; Take 1 tablet (5 mg total) by mouth daily.  Dispense: 90 tablet; Refill: 1 - desvenlafaxine (PRISTIQ) 100 MG 24 hr tablet; Take 1 tablet (100 mg total) by mouth daily.  Dispense: 90 tablet; Refill: 1 - buPROPion (WELLBUTRIN SR) 200 MG 12 hr tablet; Take 1 tablet (200 mg total)  by mouth 2 (two) times daily.  Dispense: 180 tablet; Refill: 1  5. Generalized anxiety disorder Refills of alprazolam ordered x3 months, follow up in 3 months for additional refills.  - ALPRAZolam (XANAX) 0.5 MG tablet; TAKE ONE TAB IN MORNING AND ONE AND HALF AT NIGHT FOR SLEEP ( THIS IS CHANGE IN DIRECTION)  Dispense: 75 tablet; Refill: 2   General Counseling: Taima verbalizes understanding of the findings of todays visit and agrees with plan of treatment. I have discussed any further diagnostic evaluation that may be needed or ordered today. We also reviewed her medications today. she has been encouraged to call the office with any questions or concerns that should arise related to todays visit.    Orders Placed This Encounter  Procedures   Ambulatory referral to ENT   POCT Urine Drug Screen   Ear Lavage    Meds ordered this encounter  Medications   oxybutynin (DITROPAN-XL) 5 MG 24 hr tablet    Sig: Take 1 tablet (5 mg total) by mouth at bedtime.    Dispense:  30 tablet    Refill:  2   ALPRAZolam (XANAX) 0.5 MG tablet    Sig: TAKE ONE TAB IN MORNING AND ONE AND HALF AT NIGHT FOR SLEEP ( THIS IS CHANGE IN DIRECTION)    Dispense:  75 tablet    Refill:  2    For future refills   lisinopril (ZESTRIL) 10 MG tablet    Sig: Take 1 tablet (10 mg total) by mouth daily.    Dispense:  90 tablet    Refill:  1    For future refills   levocetirizine (XYZAL) 5 MG tablet    Sig: Take 1 tablet (5 mg total) by mouth daily.    Dispense:  90 tablet    Refill:  1    For future refill   desvenlafaxine (PRISTIQ) 100 MG 24 hr tablet    Sig: Take 1 tablet (100 mg total) by  mouth daily.    Dispense:  90 tablet    Refill:  1    Ok to fill as 90 day prescription, for future refills   buPROPion (WELLBUTRIN SR) 200 MG 12 hr tablet    Sig: Take 1 tablet (200 mg total) by mouth 2 (two) times daily.    Dispense:  180 tablet    Refill:  1    For future refill   amLODipine (NORVASC) 5 MG tablet     Sig: Take 1 tablet (5 mg total) by mouth daily.    Dispense:  90 tablet    Refill:  3    Return in about 3 months (around 06/27/2022) for F/U, anxiety med refill, Tarell Schollmeyer PCP.   Total time spent:30 Minutes Time spent includes review of chart, medications, test results, and follow up plan with the patient.   [place] Controlled Substance Database was reviewed by me.  This patient was seen by Jonetta Osgood, FNP-C in collaboration with Dr. Clayborn Bigness as a part of collaborative care agreement.   Dwight Adamczak R. Valetta Fuller, MSN, FNP-C Internal medicine

## 2022-04-04 ENCOUNTER — Encounter: Payer: Self-pay | Admitting: Nurse Practitioner

## 2022-04-07 ENCOUNTER — Telehealth: Payer: Self-pay | Admitting: Nurse Practitioner

## 2022-04-07 NOTE — Telephone Encounter (Signed)
Otolaryngology referral sent via [facility] to [facility] ?

## 2022-04-07 NOTE — Telephone Encounter (Signed)
Otolaryngology appointment>> 05/12/22 with [facility]

## 2022-04-21 ENCOUNTER — Telehealth: Payer: Self-pay

## 2022-04-21 NOTE — Telephone Encounter (Signed)
Patient notified

## 2022-05-04 ENCOUNTER — Ambulatory Visit (INDEPENDENT_AMBULATORY_CARE_PROVIDER_SITE_OTHER): Payer: Medicare Other | Admitting: Urology

## 2022-05-04 VITALS — BP 162/90 | HR 81 | Ht 65.0 in | Wt 168.0 lb

## 2022-05-04 DIAGNOSIS — N3946 Mixed incontinence: Secondary | ICD-10-CM

## 2022-05-04 DIAGNOSIS — Z87440 Personal history of urinary (tract) infections: Secondary | ICD-10-CM | POA: Diagnosis not present

## 2022-05-04 DIAGNOSIS — N390 Urinary tract infection, site not specified: Secondary | ICD-10-CM

## 2022-05-04 LAB — URINALYSIS, COMPLETE
Bilirubin, UA: NEGATIVE
Glucose, UA: NEGATIVE
Ketones, UA: NEGATIVE
Leukocytes,UA: NEGATIVE
Nitrite, UA: NEGATIVE
Protein,UA: NEGATIVE
Specific Gravity, UA: 1.01 (ref 1.005–1.030)
Urobilinogen, Ur: 0.2 mg/dL (ref 0.2–1.0)
pH, UA: 6 (ref 5.0–7.5)

## 2022-05-04 LAB — MICROSCOPIC EXAMINATION

## 2022-05-04 NOTE — Progress Notes (Signed)
05/04/2022 9:48 AM   Amanda Mcgee 1955-10-26 [id number]  Referring provider: Sallyanne Kuster, NP [address] [place],  [place] [address]  No chief complaint on file.   HPI: I was consulted to assess the patient's recurrent bladder infections.  She said 5 or 6 in the last 6 months.  She presents with burning frequency that respond favorably to antibiotics.   At baseline she has both urge and stress incontinence but equally significant.  She is small-volume bedwetting.  She wears 5-6 pads a day that are damp.  He thinks she might be infected now with mild burning yesterday   She voids every 1 hour and can sit for 2 hours if she did not drink.  Nocturia x 1.  Flow is poor and does not feel empty   She may have had 2 slings in the past.  She has had kidney stones.  She has had a hysterectomy.  She describes significant vaginal narrowing from previous surgery.   She has a past history of microhematuria.  Has a smoking history.  No daily aspirin or blood thinner.   She was treated here locally years ago with medication by Dr. Achilles Dunk     Patient has recurrent urinary tract infections. Pathophysiology and workup described. She has mixed incontinence with frequency and nocturia. She understands that we will sort out her bladder infections and then review baseline her incontinence moving forward. Will return with a CT scan and for pelvic examination and cystoscopy. I called in Septra DS 1 tablet twice a day for 7 days. Then she will start daily trimethoprim.    Today Frequency stable.  Last culture was positive.  CT scan within normal limits Clinically not infected on trimethoprim but is having some gas.   On pelvic examination she did have a narrow introitus.  Visually her anatomy looked reasonably normal but when I gently placed a narrow well-lubricated speculum it was uncomfortable.  At 6:00 of the posterior fourchette she had a mild separation or cut of the epithelium.  I did not do a  thorough pelvic examination but she had a well supported bladder neck and leaked a mild amount after cystoscopy.  She did not have hypermobility.  I did not see or feel any mesh.  She had no significant prolapse   Cystoscopy.  Bladder mucosa and trigone were normal.  No foreign body.  No carcinoma.  Urine was clear.    The patient has recurrent bladder infections.  Because of the bloating or gas I switch her to Keflex 250 mg daily 30 x 11 and would like to reassess her in about 8 weeks.  I would see if her incontinence down regulates and set up treatment goals.  She had mild stress incontinence.  Vaginal access would be difficult especially for a vaginal sling.  A vaginal bulking agent under anesthesia would be an option for stress incontinence.  She does have mixed incontinence and bedwetting.  Call if culture positive.  She has been cleared for blood in the urine.  I have not yet reviewed urodynamics with her.  She does have a lot of allergies and cannot take Macrodantin.  I answered many questions about her CT scan report   Today Patient is on daily Keflex with no bloating.  In the last few days she has a little bit of burning.  She went on a trip last month at her primary caregiver oxybutynin with no benefit.  Still has urge incontinence and a little  bit of bedwetting.  Still leaks with coughing sneezing    PMH: Past Medical History:  Diagnosis Date   Anxiety    Arthritis    Depression    HTN (hypertension)    Hyperlipidemia    Kidney stones 2013    Surgical History: Past Surgical History:  Procedure Laterality Date   ABDOMINAL HYSTERECTOMY  1990   APPENDECTOMY  1969   BLADDER SURGERY  2011   BREAST BIOPSY Left    benign   COLONOSCOPY WITH PROPOFOL N/A 02/01/2018   Procedure: COLONOSCOPY WITH PROPOFOL;  Surgeon: Wyline Mood, MD;  Location: [facility] ENDOSCOPY;  Service: Gastroenterology;  Laterality: N/A;   LITHOTRIPSY  06/2011    Home Medications:  Allergies as of 05/04/2022        Reactions   Benadryl [diphenhydramine Hcl] Swelling   Biaxin [clarithromycin]    Ciprofloxacin    Codeine Swelling   Hydrochlorothiazide    Macrobid [nitrofurantoin] Nausea And Vomiting   Penicillins    Upset stomach   Prednisone    Aspirin Nausea Only        Medication List        Accurate as of May 04, 2022  9:48 AM. If you have any questions, ask your nurse or doctor.          albuterol 108 (90 Base) MCG/ACT inhaler Commonly known as: VENTOLIN HFA TAKE 2 PUFFS BY MOUTH EVERY 6 HOURS AS NEEDED FOR WHEEZE OR SHORTNESS OF BREATH   ALPRAZolam 0.5 MG tablet Commonly known as: XANAX TAKE ONE TAB IN MORNING AND ONE AND HALF AT NIGHT FOR SLEEP ( THIS IS CHANGE IN DIRECTION)   amLODipine 5 MG tablet Commonly known as: NORVASC Take 1 tablet (5 mg total) by mouth daily.   buPROPion 200 MG 12 hr tablet Commonly known as: WELLBUTRIN SR Take 1 tablet (200 mg total) by mouth 2 (two) times daily.   calcium carbonate 600 MG Tabs tablet Commonly known as: OS-CAL Take 600 mg by mouth 2 (two) times daily with a meal.   cephALEXin 250 MG capsule Commonly known as: Keflex Take 1 capsule (250 mg total) by mouth daily.   CRANBERRY PO Take 1 tablet by mouth 2 (two) times daily.   desvenlafaxine 100 MG 24 hr tablet Commonly known as: PRISTIQ Take 1 tablet (100 mg total) by mouth daily.   docusate sodium 100 MG capsule Commonly known as: COLACE Take 100 mg by mouth 2 (two) times daily. prn   fluticasone 50 MCG/ACT nasal spray Commonly known as: FLONASE SPRAY 1 SPRAY INTO BOTH NOSTRILS DAILY.   levocetirizine 5 MG tablet Commonly known as: XYZAL Take 1 tablet (5 mg total) by mouth daily.   lisinopril 10 MG tablet Commonly known as: ZESTRIL Take 1 tablet (10 mg total) by mouth daily.   meclizine 25 MG tablet Commonly known as: ANTIVERT Take twice daily as needed for dizziness   oxybutynin 5 MG 24 hr tablet Commonly known as: DITROPAN-XL Take 1 tablet (5 mg total)  by mouth at bedtime.   Vitamin D3 10 MCG (400 UNIT) Caps Take by mouth daily.        Allergies:  Allergies  Allergen Reactions   Benadryl [Diphenhydramine Hcl] Swelling   Biaxin [Clarithromycin]    Ciprofloxacin    Codeine Swelling   Hydrochlorothiazide    Macrobid [Nitrofurantoin] Nausea And Vomiting   Penicillins     Upset stomach    Prednisone    Aspirin Nausea Only    Family History: Family  History  Problem Relation Age of Onset   Breast cancer Mother 55   Heart attack Son     Social History:  reports that she has been smoking cigarettes. She has a 15.00 pack-year smoking history. She has been exposed to tobacco smoke. She has never used smokeless tobacco. She reports current alcohol use. She reports that she does not use drugs.  ROS:                                        Physical Exam: There were no vitals taken for this visit.  Constitutional:  Alert and oriented, No acute distress. HEENT: [place] AT, moist mucus membranes.  Trachea midline, no masses.   Laboratory Data: Lab Results  Component Value Date   WBC 6.5 02/03/2022   HGB 14.0 02/03/2022   HCT 40.5 02/03/2022   MCV 92 02/03/2022   PLT 212 02/03/2022    Lab Results  Component Value Date   CREATININE 0.82 02/03/2022    No results found for: "PSA"  No results found for: "TESTOSTERONE"  No results found for: "HGBA1C"  Urinalysis    Component Value Date/Time   COLORURINE Straw 08/06/2011 1810   COLORURINE YELLOW 05/09/2010 2235   APPEARANCEUR Clear 03/09/2022 0923   LABSPEC 1.023 08/06/2011 1810   PHURINE 7.0 08/06/2011 1810   PHURINE 6.5 05/09/2010 2235   GLUCOSEU Negative 03/09/2022 0923   GLUCOSEU Negative 08/06/2011 1810   HGBUR 3+ 08/06/2011 1810   HGBUR LARGE (A) 05/09/2010 2235   BILIRUBINUR Negative 03/09/2022 0923   BILIRUBINUR Negative 08/06/2011 1810   KETONESUR Negative 08/06/2011 1810   KETONESUR NEGATIVE 05/09/2010 2235   PROTEINUR Negative  03/09/2022 0923   PROTEINUR Negative 08/06/2011 1810   PROTEINUR 100 (A) 05/09/2010 2235   UROBILINOGEN 0.2 04/30/2020 1020   UROBILINOGEN 1.0 05/09/2010 2235   NITRITE Negative 03/09/2022 0923   NITRITE Negative 08/06/2011 1810   NITRITE NEGATIVE 05/09/2010 2235   LEUKOCYTESUR Negative 03/09/2022 0923   LEUKOCYTESUR 3+ 08/06/2011 1810    Pertinent Imaging: Reviewed.  Urine sent for culture.  Chart reviewed  Assessment & Plan: Has mixed incontinence with bedwetting.  She does get recurrent bladder infections.  Urine had few bacteria and call if culture positive.  Role of urodynamics discussed.  I think the patient's bedwetting is improved and she agreed with me.  There are no diagnoses linked to this encounter.  No follow-ups on file.  Martina Sinner, MD  [facility] [address], [address] [place], [place] [address] [phone]

## 2022-05-04 NOTE — Progress Notes (Signed)
Amanda Mcgee presents for an office/procedure visit. BP today is 162 90. He/She is complaint/noncompliant with BP medication. Greater than 140/90. Provider  notified. Pt advised to follow with PCP _. Pt voiced understanding.

## 2022-05-06 LAB — CULTURE, URINE COMPREHENSIVE

## 2022-05-07 ENCOUNTER — Telehealth: Payer: Self-pay | Admitting: *Deleted

## 2022-05-07 MED ORDER — SULFAMETHOXAZOLE-TRIMETHOPRIM 800-160 MG PO TABS: 1.0000 | ORAL_TABLET | Freq: Two times a day (BID) | ORAL | 0 refills | Status: DC

## 2022-05-07 NOTE — Telephone Encounter (Signed)
-----   Message from Alfredo Martinez, MD sent at 05/07/2022  2:42 PM EDT ----- Bactrim DS 1 tablet twice a day for 7 days ----- Message ----- From: Sueanne Margarita, CMA Sent: 05/06/2022   4:44 PM EDT To: Alfredo Martinez, MD   ----- Message ----- From: Interface, [facility] Lab Results In Sent: 05/04/2022  11:36 AM EDT To: Jennette Kettle [facility]

## 2022-05-07 NOTE — Telephone Encounter (Signed)
Spoke with patient and advised results rx sent to pharmacy by e-script  

## 2022-05-11 ENCOUNTER — Telehealth: Payer: Self-pay | Admitting: Urology

## 2022-05-11 MED ORDER — CEFDINIR 300 MG PO CAPS: 300.0000 mg | ORAL_CAPSULE | Freq: Two times a day (BID) | ORAL | 0 refills | Status: AC

## 2022-05-11 NOTE — Telephone Encounter (Signed)
Patient called and stated that she had a reaction to Bactrim. She said she took one tablet on 4/25, and then took one in the morning on 4/26, and one in the evening on 4/26. She said she was having heart palpitations. The pharmacy had previously  told her that can be a side effect of the medication. She said she didn't experience the palpitations the first day that she only took one. She asked if she should only take one per day, or if there is another medication she can take instead.

## 2022-05-11 NOTE — Telephone Encounter (Signed)
Spoke with patient and advised results rx sent to pharmacy by e-script  

## 2022-05-11 NOTE — Telephone Encounter (Signed)
Alfredo Martinez, MD  You14 minutes ago (10:58 AM)   Please put potential side effects with sulfa.  Please call in Omnicef 300 mg twice a day for 7 days.

## 2022-05-14 ENCOUNTER — Ambulatory Visit (INDEPENDENT_AMBULATORY_CARE_PROVIDER_SITE_OTHER): Payer: Medicare Other | Admitting: Dermatology

## 2022-05-14 ENCOUNTER — Encounter: Payer: Self-pay | Admitting: Dermatology

## 2022-05-14 VITALS — BP 103/71

## 2022-05-14 DIAGNOSIS — Z1283 Encounter for screening for malignant neoplasm of skin: Secondary | ICD-10-CM

## 2022-05-14 DIAGNOSIS — L820 Inflamed seborrheic keratosis: Secondary | ICD-10-CM | POA: Diagnosis not present

## 2022-05-14 DIAGNOSIS — L570 Actinic keratosis: Secondary | ICD-10-CM

## 2022-05-14 DIAGNOSIS — L814 Other melanin hyperpigmentation: Secondary | ICD-10-CM

## 2022-05-14 DIAGNOSIS — Z7189 Other specified counseling: Secondary | ICD-10-CM

## 2022-05-14 DIAGNOSIS — D1801 Hemangioma of skin and subcutaneous tissue: Secondary | ICD-10-CM

## 2022-05-14 DIAGNOSIS — Z79899 Other long term (current) drug therapy: Secondary | ICD-10-CM

## 2022-05-14 DIAGNOSIS — D229 Melanocytic nevi, unspecified: Secondary | ICD-10-CM

## 2022-05-14 DIAGNOSIS — X32XXXA Exposure to sunlight, initial encounter: Secondary | ICD-10-CM

## 2022-05-14 DIAGNOSIS — L821 Other seborrheic keratosis: Secondary | ICD-10-CM

## 2022-05-14 DIAGNOSIS — W908XXA Exposure to other nonionizing radiation, initial encounter: Secondary | ICD-10-CM

## 2022-05-14 DIAGNOSIS — L578 Other skin changes due to chronic exposure to nonionizing radiation: Secondary | ICD-10-CM

## 2022-05-14 DIAGNOSIS — Z872 Personal history of diseases of the skin and subcutaneous tissue: Secondary | ICD-10-CM

## 2022-05-14 NOTE — Patient Instructions (Addendum)
Cryotherapy Aftercare  Wash gently with soap and water everyday.   Apply Vaseline and Band-Aid daily until healed.     Due to recent changes in healthcare laws, you may see results of your pathology and/or laboratory studies on MyChart before the doctors have had a chance to review them. We understand that in some cases there may be results that are confusing or concerning to you. Please understand that not all results are received at the same time and often the doctors may need to interpret multiple results in order to provide you with the best plan of care or course of treatment. Therefore, we ask that you please give us 2 business days to thoroughly review all your results before contacting the office for clarification. Should we see a critical lab result, you will be contacted sooner.   If You Need Anything After Your Visit  If you have any questions or concerns for your doctor, please call our main line at [phone] and press option 4 to reach your doctor's medical assistant. If no one answers, please leave a voicemail as directed and we will return your call as soon as possible. Messages left after 4 pm will be answered the following business day.   You may also send us a message via MyChart. We typically respond to MyChart messages within 1-2 business days.  For prescription refills, please ask your pharmacy to contact our office. Our fax number is [phone].  If you have an urgent issue when the clinic is closed that cannot wait until the next business day, you can page your doctor at the number below.    Please note that while we do our best to be available for urgent issues outside of office hours, we are not available 24/7.   If you have an urgent issue and are unable to reach us, you may choose to seek medical care at your doctor's office, retail clinic, urgent care center, or emergency room.  If you have a medical emergency, please immediately call 911 or go to the  emergency department.  Pager Numbers  - Dr. Kowalski: [phone]  - Dr. Moye: [phone]  - Dr. Stewart: [phone]  In the event of inclement weather, please call our main line at [phone] for an update on the status of any delays or closures.  Dermatology Medication Tips: Please keep the boxes that topical medications come in in order to help keep track of the instructions about where and how to use these. Pharmacies typically print the medication instructions only on the boxes and not directly on the medication tubes.   If your medication is too expensive, please contact our office at [phone] option 4 or send us a message through MyChart.   We are unable to tell what your co-pay for medications will be in advance as this is different depending on your insurance coverage. However, we may be able to find a substitute medication at lower cost or fill out paperwork to get insurance to cover a needed medication.   If a prior authorization is required to get your medication covered by your insurance company, please allow us 1-2 business days to complete this process.  Drug prices often vary depending on where the prescription is filled and some pharmacies may offer cheaper prices.  The website www.goodrx.com contains coupons for medications through different pharmacies. The prices here do not account for what the cost may be with help from insurance (it may be cheaper with your insurance), but the website can   give you the price if you did not use any insurance.  - You can print the associated coupon and take it with your prescription to the pharmacy.  - You may also stop by our office during regular business hours and pick up a GoodRx coupon card.  - If you need your prescription sent electronically to a different pharmacy, notify our office through [facility] MyChart or by phone at [phone] option 4.     Si Usted Necesita Algo Despus de Su Visita  Tambin puede  enviarnos un mensaje a travs de MyChart. Por lo general respondemos a los mensajes de MyChart en el transcurso de 1 a 2 das hbiles.  Para renovar recetas, por favor pida a su farmacia que se ponga en contacto con nuestra oficina. Nuestro nmero de fax es el [phone].  Si tiene un asunto urgente cuando la clnica est cerrada y que no puede esperar hasta el siguiente da hbil, puede llamar/localizar a su doctor(a) al nmero que aparece a continuacin.   Por favor, tenga en cuenta que aunque hacemos todo lo posible para estar disponibles para asuntos urgentes fuera del horario de oficina, no estamos disponibles las 24 horas del da, los 7 das de la semana.   Si tiene un problema urgente y no puede comunicarse con nosotros, puede optar por buscar atencin mdica  en el consultorio de su doctor(a), en una clnica privada, en un centro de atencin urgente o en una sala de emergencias.  Si tiene una emergencia mdica, por favor llame inmediatamente al 911 o vaya a la sala de emergencias.  Nmeros de bper  - Dr. Kowalski: [phone]  - Dra. Moye: [phone]  - Dra. Stewart: [phone]  En caso de inclemencias del tiempo, por favor llame a nuestra lnea principal al [phone] para una actualizacin sobre el estado de cualquier retraso o cierre.  Consejos para la medicacin en dermatologa: Por favor, guarde las cajas en las que vienen los medicamentos de uso tpico para ayudarle a seguir las instrucciones sobre dnde y cmo usarlos. Las farmacias generalmente imprimen las instrucciones del medicamento slo en las cajas y no directamente en los tubos del medicamento.   Si su medicamento es muy caro, por favor, pngase en contacto con nuestra oficina llamando al [phone] y presione la opcin 4 o envenos un mensaje a travs de MyChart.   No podemos decirle cul ser su copago por los medicamentos por adelantado ya que esto es diferente dependiendo de la cobertura de su seguro.  Sin embargo, es posible que podamos encontrar un medicamento sustituto a menor costo o llenar un formulario para que el seguro cubra el medicamento que se considera necesario.   Si se requiere una autorizacin previa para que su compaa de seguros cubra su medicamento, por favor permtanos de 1 a 2 das hbiles para completar este proceso.  Los precios de los medicamentos varan con frecuencia dependiendo del lugar de dnde se surte la receta y alguna farmacias pueden ofrecer precios ms baratos.  El sitio web www.goodrx.com tiene cupones para medicamentos de diferentes farmacias. Los precios aqu no tienen en cuenta lo que podra costar con la ayuda del seguro (puede ser ms barato con su seguro), pero el sitio web puede darle el precio si no utiliz ningn seguro.  - Puede imprimir el cupn correspondiente y llevarlo con su receta a la farmacia.  - Tambin puede pasar por nuestra oficina durante el horario de atencin regular y recoger una tarjeta de cupones de GoodRx.  -   Si necesita que su receta se enve electrnicamente a una farmacia diferente, informe a nuestra oficina a travs de MyChart de [facility] o por telfono llamando al [phone] y presione la opcin 4.  

## 2022-05-14 NOTE — Progress Notes (Signed)
Follow-Up Visit   Subjective  Amanda Mcgee is a 67 y.o. female who presents for the following: Skin Cancer Screening and Full Body Skin Exam,   The patient presents for Total-Body Skin Exam (TBSE) for skin cancer screening and mole check. The patient has spots, moles and lesions to be evaluated, some may be new or changing and the patient has concerns that these could be cancer.    The following portions of the chart were reviewed this encounter and updated as appropriate: medications, allergies, medical history  Review of Systems:  No other skin or systemic complaints except as noted in HPI or Assessment and Plan.  Objective  Well appearing patient in no apparent distress; mood and affect are within normal limits.  A full examination was performed including scalp, head, eyes, ears, nose, lips, neck, chest, axillae, abdomen, back, buttocks, bilateral upper extremities, bilateral lower extremities, hands, feet, fingers, toes, fingernails, and toenails. All findings within normal limits unless otherwise noted below.   Relevant physical exam findings are noted in the Assessment and Plan.  nose x 1 Pink scaly macules  Right Ear x 1 Stuck on waxy paps with erythema    Assessment & Plan   LENTIGINES, SEBORRHEIC KERATOSES, HEMANGIOMAS - Benign normal skin lesions - Benign-appearing - Call for any changes  MELANOCYTIC NEVI - Tan-brown and/or pink-flesh-colored symmetric macules and papules - Benign appearing on exam today - Observation - Call clinic for new or changing moles - Recommend daily use of broad spectrum spf 30+ sunscreen to sun-exposed areas.   ACTINIC DAMAGE - Chronic condition, secondary to cumulative UV/sun exposure - diffuse scaly erythematous macules with underlying dyspigmentation - Recommend daily broad spectrum sunscreen SPF 30+ to sun-exposed areas, reapply every 2 hours as needed.  - Staying in the shade or wearing long sleeves, sun glasses (UVA+UVB  protection) and wide brim hats (4-inch brim around the entire circumference of the hat) are also recommended for sun protection.  - Call for new or changing lesions.  SKIN CANCER SCREENING PERFORMED TODAY.   HISTORY of INTERTRIGO Exam Inframammary clear  Well controlled  Intertrigo is a chronic recurrent rash that occurs in skin fold areas that may be associated with friction; heat; moisture; yeast; fungus; and bacteria.  It is exacerbated by increased movement / activity; sweating; and higher atmospheric temperature.  Treatment Plan Prn flares : Cont HC cream 2.5%  qd 3d/wk Tuesday, Thursday, Saturday Cont Ketoconazole 2% cr qd 3d/wk Monday, Wednesday, Friday  Topical steroids (such as triamcinolone, fluocinolone, fluocinonide, mometasone, clobetasol, halobetasol, betamethasone, hydrocortisone) can cause thinning and lightening of the skin if they are used for too long in the same area. Your physician has selected the right strength medicine for your problem and area affected on the body. Please use your medication only as directed by your physician to prevent side effects.    AK (actinic keratosis) nose x 1  Destruction of lesion - nose x 1 Complexity: simple   Destruction method: cryotherapy   Informed consent: discussed and consent obtained   Timeout:  patient name, date of birth, surgical site, and procedure verified Lesion destroyed using liquid nitrogen: Yes   Region frozen until ice ball extended beyond lesion: Yes   Outcome: patient tolerated procedure well with no complications   Post-procedure details: wound care instructions given    Inflamed seborrheic keratosis Right Ear x 1  Symptomatic, irritating, patient would like treated.   Destruction of lesion - Right Ear x 1 Complexity: simple  Destruction method: cryotherapy   Informed consent: discussed and consent obtained   Timeout:  patient name, date of birth, surgical site, and procedure verified Lesion  destroyed using liquid nitrogen: Yes   Region frozen until ice ball extended beyond lesion: Yes   Outcome: patient tolerated procedure well with no complications   Post-procedure details: wound care instructions given     Return in about 1 year (around 05/14/2023) for TBSE, Hx of AKs.  I, Ardis Rowan, RMA, am acting as scribe for Armida Sans, MD .   Documentation: I have reviewed the above documentation for accuracy and completeness, and I agree with the above.  Armida Sans, MD

## 2022-05-18 NOTE — Telephone Encounter (Signed)
Pt calling stating that she is still having pressure and think it may be another UTI. Pt just finished Ceftin yesterday, she also canceled her UDS because she thought she may have UTI. I reassured pt that the UDS would help diagnose how her bladder is working and filling. Pt wanted to drop off sample and I explained that office policy prohibits that. Advised pt to call [facility] to schedule UDS and follow-up as instructed.

## 2022-05-20 ENCOUNTER — Encounter: Payer: Self-pay | Admitting: Dermatology

## 2022-06-01 ENCOUNTER — Encounter: Payer: Self-pay | Admitting: Urology

## 2022-06-01 ENCOUNTER — Ambulatory Visit (INDEPENDENT_AMBULATORY_CARE_PROVIDER_SITE_OTHER): Payer: Medicare Other | Admitting: Urology

## 2022-06-01 VITALS — BP 141/84 | HR 87 | Ht 65.0 in | Wt 162.0 lb

## 2022-06-01 DIAGNOSIS — N3946 Mixed incontinence: Secondary | ICD-10-CM | POA: Diagnosis not present

## 2022-06-01 DIAGNOSIS — R82998 Other abnormal findings in urine: Secondary | ICD-10-CM | POA: Diagnosis not present

## 2022-06-01 DIAGNOSIS — Z87440 Personal history of urinary (tract) infections: Secondary | ICD-10-CM

## 2022-06-01 DIAGNOSIS — N390 Urinary tract infection, site not specified: Secondary | ICD-10-CM

## 2022-06-01 LAB — URINALYSIS, COMPLETE
Bilirubin, UA: NEGATIVE
Glucose, UA: NEGATIVE
Ketones, UA: NEGATIVE
Leukocytes,UA: NEGATIVE
Nitrite, UA: NEGATIVE
Specific Gravity, UA: 1.02 (ref 1.005–1.030)
Urobilinogen, Ur: 0.2 mg/dL (ref 0.2–1.0)
pH, UA: 7 (ref 5.0–7.5)

## 2022-06-01 LAB — MICROSCOPIC EXAMINATION

## 2022-06-01 MED ORDER — MIRABEGRON ER 25 MG PO TB24: 25.0000 mg | ORAL_TABLET | Freq: Every day | ORAL | 0 refills | Status: DC

## 2022-06-01 NOTE — Progress Notes (Signed)
06/01/2022 8:47 AM   Amanda Mcgee 04-Jul-1955 [id number]  Referring provider: Sallyanne Kuster, NP [address] [place],  [place] [address]  No chief complaint on file.   HPI: I was consulted to assess the patient's recurrent bladder infections.  She said 5 or 6 in the last 6 months.  She presents with burning frequency that respond favorably to antibiotics.   At baseline she has both urge and stress incontinence but equally significant.  She is small-volume bedwetting.  She wears 5-6 pads a day that are damp.  He thinks she might be infected now with mild burning yesterday   She voids every 1 hour and can sit for 2 hours if she did not drink.  Nocturia x 1.  Flow is poor and does not feel empty   She may have had 2 slings in the past.  She has had kidney stones.  She has had a hysterectomy.  She describes significant vaginal narrowing from previous surgery.   She has a past history of microhematuria.  Has a smoking history.  No daily aspirin or blood thinner.   She was treated here locally years ago with medication by Dr. Achilles Dunk     Patient has recurrent urinary tract infections. Pathophysiology and workup described. She has mixed incontinence with frequency and nocturia. She understands that we will sort out her bladder infections and then review baseline her incontinence moving forward. Will return with a CT scan and for pelvic examination and cystoscopy. I called in Septra DS 1 tablet twice a day for 7 days. Then she will start daily trimethoprim.    Last culture was positive.  CT scan within normal limits Clinically not infected on trimethoprim but is having some gas.   On pelvic examination she did have a narrow introitus.  Visually her anatomy looked reasonably normal but when I gently placed a narrow well-lubricated speculum it was uncomfortable.  At 6:00 of the posterior fourchette she had a mild separation or cut of the epithelium.  I did not do a thorough pelvic examination  but she had a well supported bladder neck and leaked a mild amount after cystoscopy.  She did not have hypermobility.  I did not see or feel any mesh.  She had no significant prolapse   Cystoscopy.  Bladder mucosa and trigone were normal.  No foreign body.  No carcinoma.  Urine was clear.     The patient has recurrent bladder infections.  Because of the bloating or gas I switch her to Keflex 250 mg daily 30 x 11 and would like to reassess her in about 8 weeks.  I would see if her incontinence down regulates and set up treatment goals.  She had mild stress incontinence.  Vaginal access would be difficult especially for a vaginal sling.  A vaginal bulking agent under anesthesia would be an option for stress incontinence.  She does have mixed incontinence and bedwetting.  Call if culture positive.  She has been cleared for blood in the urine.   She does have a lot of allergies and cannot take Macrodantin.  I answered many questions about her CT scan report    Today Patient is on daily Keflex with no bloating.  In the last few days she has a little bit of burning.  She went on a trip last month at her primary caregiver oxybutynin with no benefit.  Still has urge incontinence and a little bit of bedwetting.  Still leaks with coughing sneezing  Today Frequency stable.  Incontinence stable.  Last culture was positive with 2 organisms.  She cannot take sulfa drug, ciprofloxacin, Macrodantin, or penicillin During urodynamics patient could not void was catheterized for 25 mL.  Maximal bladder capacity was 238 mL.  Bladder she had increased bladder sensation.  Bladder was stable.  Cuff leak point pressure 200 mL was 19 cm of water with severe leak each and her Valsalva leak point pressure was 68 cmH2O with mild leakage.  Her cough leak point pressure 300 mL was 110 cm of water with severe leak easing her Valsalva leak point pressures 32 cmH2O with moderate leakage.  During voiding she voided 92 mL with a maximal  flow 11 mL/s.  Maximum voiding pressure 15 cm of water.  Residual was 146 mL.  Bladder neck descent at less than a centimeter.  The details of the urodynamics are signed dictated   PMH: Past Medical History:  Diagnosis Date   Actinic keratosis    Anxiety    Arthritis    Depression    HTN (hypertension)    Hyperlipidemia    Kidney stones 2013    Surgical History: Past Surgical History:  Procedure Laterality Date   ABDOMINAL HYSTERECTOMY  1990   APPENDECTOMY  1969   BLADDER SURGERY  2011   BREAST BIOPSY Left    benign   COLONOSCOPY WITH PROPOFOL N/A 02/01/2018   Procedure: COLONOSCOPY WITH PROPOFOL;  Surgeon: Wyline Mood, MD;  Location[facility] ENDOSCOPY;  Service: Gastroenterology;  Laterality: N/A;   LITHOTRIPSY  06/2011    Home Medications:  Allergies as of 06/01/2022       Reactions   Bactrim [sulfamethoxazole-trimethoprim]    Heart palpitations    Benadryl [diphenhydramine Hcl] Swelling   Biaxin [clarithromycin]    Ciprofloxacin    Codeine Swelling   Hydrochlorothiazide    Macrobid [nitrofurantoin] Nausea And Vomiting   Penicillins    Upset stomach   Prednisone    Aspirin Nausea Only        Medication List        Accurate as of Jun 01, 2022  8:47 AM. If you have any questions, ask your nurse or doctor.          albuterol 108 (90 Base) MCG/ACT inhaler Commonly known as: VENTOLIN HFA TAKE 2 PUFFS BY MOUTH EVERY 6 HOURS AS NEEDED FOR WHEEZE OR SHORTNESS OF BREATH   ALPRAZolam 0.5 MG tablet Commonly known as: XANAX TAKE ONE TAB IN MORNING AND ONE AND HALF AT NIGHT FOR SLEEP ( THIS IS CHANGE IN DIRECTION)   amLODipine 5 MG tablet Commonly known as: NORVASC Take 1 tablet (5 mg total) by mouth daily.   buPROPion 200 MG 12 hr tablet Commonly known as: WELLBUTRIN SR Take 1 tablet (200 mg total) by mouth 2 (two) times daily.   calcium carbonate 600 MG Tabs tablet Commonly known as: OS-CAL Take 600 mg by mouth 2 (two) times daily with a meal.    cephALEXin 250 MG capsule Commonly known as: Keflex Take 1 capsule (250 mg total) by mouth daily.   CRANBERRY PO Take 1 tablet by mouth 2 (two) times daily.   desvenlafaxine 100 MG 24 hr tablet Commonly known as: PRISTIQ Take 1 tablet (100 mg total) by mouth daily.   docusate sodium 100 MG capsule Commonly known as: COLACE Take 100 mg by mouth 2 (two) times daily. prn   fluticasone 50 MCG/ACT nasal spray Commonly known as: FLONASE SPRAY 1 SPRAY INTO BOTH NOSTRILS DAILY.  levocetirizine 5 MG tablet Commonly known as: XYZAL Take 1 tablet (5 mg total) by mouth daily.   lisinopril 10 MG tablet Commonly known as: ZESTRIL Take 1 tablet (10 mg total) by mouth daily.   meclizine 25 MG tablet Commonly known as: ANTIVERT Take twice daily as needed for dizziness   oxybutynin 5 MG 24 hr tablet Commonly known as: DITROPAN-XL Take 1 tablet (5 mg total) by mouth at bedtime.   Vitamin D3 10 MCG (400 UNIT) Caps Take by mouth daily.        Allergies:  Allergies  Allergen Reactions   Bactrim [Sulfamethoxazole-Trimethoprim]     Heart palpitations    Benadryl [Diphenhydramine Hcl] Swelling   Biaxin [Clarithromycin]    Ciprofloxacin    Codeine Swelling   Hydrochlorothiazide    Macrobid [Nitrofurantoin] Nausea And Vomiting   Penicillins     Upset stomach    Prednisone    Aspirin Nausea Only    Family History: Family History  Problem Relation Age of Onset   Breast cancer Mother 59   Heart attack Son     Social History:  reports that she has been smoking cigarettes. She has a 15.00 pack-year smoking history. She has been exposed to tobacco smoke. She has never used smokeless tobacco. She reports current alcohol use. She reports that she does not use drugs.  ROS:                                        Physical Exam: There were no vitals taken for this visit.  Constitutional:  Alert and oriented, No acute distress. HEENT: [place] AT, moist mucus  membranes.  Trachea midline, no masses.  Laboratory Data: Lab Results  Component Value Date   WBC 6.5 02/03/2022   HGB 14.0 02/03/2022   HCT 40.5 02/03/2022   MCV 92 02/03/2022   PLT 212 02/03/2022    Lab Results  Component Value Date   CREATININE 0.82 02/03/2022    No results found for: "PSA"  No results found for: "TESTOSTERONE"  No results found for: "HGBA1C"  Urinalysis    Component Value Date/Time   COLORURINE Straw 08/06/2011 1810   COLORURINE YELLOW 05/09/2010 2235   APPEARANCEUR Clear 05/04/2022 0953   LABSPEC 1.023 08/06/2011 1810   PHURINE 7.0 08/06/2011 1810   PHURINE 6.5 05/09/2010 2235   GLUCOSEU Negative 05/04/2022 0953   GLUCOSEU Negative 08/06/2011 1810   HGBUR 3+ 08/06/2011 1810   HGBUR LARGE (A) 05/09/2010 2235   BILIRUBINUR Negative 05/04/2022 0953   BILIRUBINUR Negative 08/06/2011 1810   KETONESUR Negative 08/06/2011 1810   KETONESUR NEGATIVE 05/09/2010 2235   PROTEINUR Negative 05/04/2022 0953   PROTEINUR Negative 08/06/2011 1810   PROTEINUR 100 (A) 05/09/2010 2235   UROBILINOGEN 0.2 04/30/2020 1020   UROBILINOGEN 1.0 05/09/2010 2235   NITRITE Negative 05/04/2022 0953   NITRITE Negative 08/06/2011 1810   NITRITE NEGATIVE 05/09/2010 2235   LEUKOCYTESUR Negative 05/04/2022 0953   LEUKOCYTESUR 3+ 08/06/2011 1810    Pertinent Imaging:   Assessment & Plan: Patient has mixed incontinence with mild bedwetting but she has low leak point pressures.  If she underwent a bulking agent treatment it would be done under anesthesia as noted above.  She would not have a third sling.  Refractory overactive bladder treatments and living with stress incontinence is another option.  I will initially treat her with medical and behavioral therapy.  It appears she has had a breakthrough infection on daily Keflex and I will send the urine for culture.  She got bloated with trimethoprim and cannot take other medications as noted.  His urine looked positive today  but she is not symptomatic.  She says she was symptomatic in April when I treated her for the positive culture.  I will reassess her in 6 weeks on Myrbetriq 25 mg samples and prescription.  We will proceed accordingly.  Check  postvoid residual when she comes back   1. Recurrent UTI  - Urinalysis, Complete   No follow-ups on file.  Martina Sinner, MD  [facility] [address] [place], [place] [address] [phone]

## 2022-06-04 LAB — CULTURE, URINE COMPREHENSIVE

## 2022-06-24 ENCOUNTER — Encounter: Payer: Self-pay | Admitting: Nurse Practitioner

## 2022-06-24 ENCOUNTER — Ambulatory Visit (INDEPENDENT_AMBULATORY_CARE_PROVIDER_SITE_OTHER): Payer: Medicare Other | Admitting: Nurse Practitioner

## 2022-06-24 VITALS — BP 134/86 | HR 85 | Temp 98.2°F | Resp 16 | Ht 65.0 in | Wt 162.6 lb

## 2022-06-24 DIAGNOSIS — R42 Dizziness and giddiness: Secondary | ICD-10-CM | POA: Diagnosis not present

## 2022-06-24 DIAGNOSIS — L738 Other specified follicular disorders: Secondary | ICD-10-CM

## 2022-06-24 DIAGNOSIS — M19041 Primary osteoarthritis, right hand: Secondary | ICD-10-CM

## 2022-06-24 DIAGNOSIS — M25471 Effusion, right ankle: Secondary | ICD-10-CM | POA: Diagnosis not present

## 2022-06-24 DIAGNOSIS — M19042 Primary osteoarthritis, left hand: Secondary | ICD-10-CM

## 2022-06-24 DIAGNOSIS — F411 Generalized anxiety disorder: Secondary | ICD-10-CM

## 2022-06-24 MED ORDER — ALPRAZOLAM 0.5 MG PO TABS: ORAL_TABLET | ORAL | 2 refills | Status: DC

## 2022-06-24 MED ORDER — MUPIROCIN 2 % EX OINT: 1.0000 | TOPICAL_OINTMENT | Freq: Every day | CUTANEOUS | 1 refills | Status: DC

## 2022-06-24 MED ORDER — MECLIZINE HCL 25 MG PO TABS: ORAL_TABLET | ORAL | 1 refills | Status: DC

## 2022-06-24 NOTE — Progress Notes (Signed)
[facility] [address] [place], [place] [address]  Internal MEDICINE  Office Visit Note  Patient Name: Amanda Mcgee  [id number]  [id number]  Date of Service: 06/24/2022  Chief Complaint  Patient presents with   Follow-up    Anxiety med     HPI Zaya presents for a follow-up visit for anxiety, arthritis.  Blisters under breast -- and still using lotrisone on yeast rash under breast Swollen right ankle -- arthritis or sprain possible.  Trigger finger right hand 4th finger or might be arthritis Seen by urology -- bladder spasms and leaking bladder stem -- switched to myrbetriq from oxybutynin.  Had ears washed out by ENT -- doing better, still dizzy sometimes.  Anxiety -- due for alprazolam refills.     Current Medication: Outpatient Encounter Medications as of 06/24/2022  Medication Sig   albuterol (VENTOLIN HFA) 108 (90 Base) MCG/ACT inhaler TAKE 2 PUFFS BY MOUTH EVERY 6 HOURS AS NEEDED FOR WHEEZE OR SHORTNESS OF BREATH   amLODipine (NORVASC) 5 MG tablet Take 1 tablet (5 mg total) by mouth daily.   buPROPion (WELLBUTRIN SR) 200 MG 12 hr tablet Take 1 tablet (200 mg total) by mouth 2 (two) times daily.   calcium carbonate (OS-CAL) 600 MG TABS tablet Take 600 mg by mouth 2 (two) times daily with a meal.   cephALEXin (KEFLEX) 250 MG capsule Take 1 capsule (250 mg total) by mouth daily.   Cholecalciferol (VITAMIN D3) 400 units CAPS Take by mouth daily.   CRANBERRY PO Take 1 tablet by mouth 2 (two) times daily.   desvenlafaxine (PRISTIQ) 100 MG 24 hr tablet Take 1 tablet (100 mg total) by mouth daily.   docusate sodium (COLACE) 100 MG capsule Take 100 mg by mouth 2 (two) times daily. prn   fluticasone (FLONASE) 50 MCG/ACT nasal spray SPRAY 1 SPRAY INTO BOTH NOSTRILS DAILY.   levocetirizine (XYZAL) 5 MG tablet Take 1 tablet (5 mg total) by mouth daily.   lisinopril (ZESTRIL) 10 MG tablet Take 1 tablet (10 mg total) by mouth daily.   mirabegron ER (MYRBETRIQ) 25 MG  TB24 tablet Take 1 tablet (25 mg total) by mouth daily.   mupirocin ointment (BACTROBAN) 2 % Apply 1 Application topically daily. To affected area until resolved.   [DISCONTINUED] ALPRAZolam (XANAX) 0.5 MG tablet TAKE ONE TAB IN MORNING AND ONE AND HALF AT NIGHT FOR SLEEP ( THIS IS CHANGE IN DIRECTION)   [DISCONTINUED] meclizine (ANTIVERT) 25 MG tablet Take twice daily as needed for dizziness   ALPRAZolam (XANAX) 0.5 MG tablet TAKE ONE TAB IN MORNING AND ONE AND HALF AT NIGHT FOR SLEEP ( THIS IS CHANGE IN DIRECTION)   meclizine (ANTIVERT) 25 MG tablet Take twice daily as needed for dizziness   [DISCONTINUED] oxybutynin (DITROPAN-XL) 5 MG 24 hr tablet Take 1 tablet (5 mg total) by mouth at bedtime. (Patient not taking: Reported on 06/24/2022)   No facility-administered encounter medications on file as of 06/24/2022.    Surgical History: Past Surgical History:  Procedure Laterality Date   ABDOMINAL HYSTERECTOMY  1990   APPENDECTOMY  1969   BLADDER SURGERY  2011   BREAST BIOPSY Left    benign   COLONOSCOPY WITH PROPOFOL N/A 02/01/2018   Procedure: COLONOSCOPY WITH PROPOFOL;  Surgeon: Wyline Mood, MD;  Location: [facility] ENDOSCOPY;  Service: Gastroenterology;  Laterality: N/A;   LITHOTRIPSY  06/2011    Medical History: Past Medical History:  Diagnosis Date   Actinic keratosis    Anxiety  Arthritis    Depression    HTN (hypertension)    Hyperlipidemia    Kidney stones 2013    Family History: Family History  Problem Relation Age of Onset   Breast cancer Mother 35   Heart attack Son     Social History   Socioeconomic History   Marital status: Married    Spouse name: Not on file   Number of children: Not on file   Years of education: Not on file   Highest education level: Not on file  Occupational History   Not on file  Tobacco Use   Smoking status: Every Day    Packs/day: 0.50    Years: 30.00    Additional pack years: 0.00    Total pack years: 15.00    Types: Cigarettes     Passive exposure: Current   Smokeless tobacco: Never   Tobacco comments:    1/2 pack daily  Vaping Use   Vaping Use: Never used  Substance and Sexual Activity   Alcohol use: Yes    Comment: occassionally   Drug use: No   Sexual activity: Never  Other Topics Concern   Not on file  Social History Narrative   Not on file   Social Determinants of Health   Financial Resource Strain: Not on file  Food Insecurity: Not on file  Transportation Needs: Not on file  Physical Activity: Not on file  Stress: Not on file  Social Connections: Not on file  Intimate Partner Violence: Not on file      Review of Systems  Constitutional:  Negative for chills, fatigue and unexpected weight change.  HENT:  Negative for congestion, rhinorrhea, sneezing and sore throat.   Eyes:  Negative for redness.  Respiratory: Negative.  Negative for cough, chest tightness, shortness of breath and wheezing.   Cardiovascular: Negative.  Negative for chest pain and palpitations.  Gastrointestinal: Negative.  Negative for abdominal pain, constipation, diarrhea, nausea and vomiting.  Genitourinary:  Negative for dysuria and frequency.  Musculoskeletal:  Positive for arthralgias (hands) and joint swelling (right ankle). Negative for back pain and neck pain.  Skin:  Negative for rash.       Red inflamed follicles under breast, they start off as "pimples" and they break in the shower. Possibly from friction.   Neurological:  Positive for dizziness and light-headedness. Negative for tremors and numbness.  Hematological:  Negative for adenopathy. Does not bruise/bleed easily.  Psychiatric/Behavioral:  Negative for behavioral problems (Depression), self-injury, sleep disturbance and suicidal ideas. The patient is nervous/anxious.     Vital Signs: BP 134/86   Pulse 85   Temp 98.2 F (36.8 C)   Resp 16   Ht 5\' 5"  (1.651 m)   Wt 162 lb 9.6 oz (73.8 kg)   SpO2 96%   BMI 27.06 kg/m    Physical Exam Vitals  reviewed.  Constitutional:      General: She is not in acute distress.    Appearance: Normal appearance. She is obese. She is not ill-appearing.  HENT:     Head: Normocephalic and atraumatic.  Eyes:     Pupils: Pupils are equal, round, and reactive to light.  Cardiovascular:     Rate and Rhythm: Normal rate and regular rhythm.  Pulmonary:     Effort: Pulmonary effort is normal. No respiratory distress.  Chest:       Comments: Small red spots that are open and inflamed, possible folliculitis Musculoskeletal:     Right hand: Decreased range  of motion (stiffness).     Left hand: Decreased range of motion (stiffness).     Right ankle: Swelling (with pain and tenderness) present. Tenderness present. Decreased range of motion.  Neurological:     Mental Status: She is alert and oriented to person, place, and time.  Psychiatric:        Mood and Affect: Mood normal.        Behavior: Behavior normal.        Assessment/Plan: 1. Bacterial folliculitis Apply mupirocin ointment to affected area under breast until resolved.  - mupirocin ointment (BACTROBAN) 2 %; Apply 1 Application topically daily. To affected area until resolved.  Dispense: 30 g; Refill: 1  2. Primary osteoarthritis of both hands Try OTC ibuprofen as needed as directed on packaging. If no improvement call the clinic, can referred to hand specialist, 4th right finger might be trigger finger  3. Right ankle swelling Try OTC ibuprofen as directed on packaging. Possible arthritis or sprain, if no improvement or worsening, consider xray.   4. Vertigo Continue meclizine as prescribed.  - meclizine (ANTIVERT) 25 MG tablet; Take twice daily as needed for dizziness  Dispense: 90 tablet; Refill: 1  5. Generalized anxiety disorder Continue prn alprazolam as prescribed.  - ALPRAZolam (XANAX) 0.5 MG tablet; TAKE ONE TAB IN MORNING AND ONE AND HALF AT NIGHT FOR SLEEP ( THIS IS CHANGE IN DIRECTION)  Dispense: 75 tablet; Refill:  2   General Counseling: Deshanta verbalizes understanding of the findings of todays visit and agrees with plan of treatment. I have discussed any further diagnostic evaluation that may be needed or ordered today. We also reviewed her medications today. she has been encouraged to call the office with any questions or concerns that should arise related to todays visit.    No orders of the defined types were placed in this encounter.   Meds ordered this encounter  Medications   ALPRAZolam (XANAX) 0.5 MG tablet    Sig: TAKE ONE TAB IN MORNING AND ONE AND HALF AT NIGHT FOR SLEEP ( THIS IS CHANGE IN DIRECTION)    Dispense:  75 tablet    Refill:  2    For future refills   meclizine (ANTIVERT) 25 MG tablet    Sig: Take twice daily as needed for dizziness    Dispense:  90 tablet    Refill:  1    Patient will call for refills when she needs them.   mupirocin ointment (BACTROBAN) 2 %    Sig: Apply 1 Application topically daily. To affected area until resolved.    Dispense:  30 g    Refill:  1    Return in about 3 months (around 09/17/2022) for F/U, anxiety med refill, Jerick Khachatryan PCP.   Total time spent:30 Minutes Time spent includes review of chart, medications, test results, and follow up plan with the patient.    Controlled Substance Database was reviewed by me.  This patient was seen by Sallyanne Kuster, FNP-C in collaboration with Dr. Beverely Risen as a part of collaborative care agreement.   Martise Waddell R. Tedd Sias, MSN, FNP-C Internal medicine

## 2022-07-01 ENCOUNTER — Other Ambulatory Visit: Payer: Self-pay | Admitting: Nurse Practitioner

## 2022-07-01 DIAGNOSIS — N3281 Overactive bladder: Secondary | ICD-10-CM

## 2022-07-20 ENCOUNTER — Ambulatory Visit (INDEPENDENT_AMBULATORY_CARE_PROVIDER_SITE_OTHER): Payer: Medicare Other | Admitting: Urology

## 2022-07-20 VITALS — BP 131/76 | HR 79 | Ht 65.0 in | Wt 162.5 lb

## 2022-07-20 DIAGNOSIS — N3946 Mixed incontinence: Secondary | ICD-10-CM | POA: Diagnosis not present

## 2022-07-20 DIAGNOSIS — Z87440 Personal history of urinary (tract) infections: Secondary | ICD-10-CM | POA: Diagnosis not present

## 2022-07-20 DIAGNOSIS — N390 Urinary tract infection, site not specified: Secondary | ICD-10-CM

## 2022-07-20 LAB — URINALYSIS, COMPLETE
Bilirubin, UA: NEGATIVE
Glucose, UA: NEGATIVE
Ketones, UA: NEGATIVE
Leukocytes,UA: NEGATIVE
Nitrite, UA: NEGATIVE
Protein,UA: NEGATIVE
Specific Gravity, UA: 1.015 (ref 1.005–1.030)
Urobilinogen, Ur: 0.2 mg/dL (ref 0.2–1.0)
pH, UA: 7 (ref 5.0–7.5)

## 2022-07-20 LAB — MICROSCOPIC EXAMINATION

## 2022-07-20 LAB — BLADDER SCAN AMB NON-IMAGING: Scan Result: 29

## 2022-07-20 MED ORDER — GEMTESA 75 MG PO TABS: 1.0000 | ORAL_TABLET | Freq: Every day | ORAL | 0 refills | Status: DC

## 2022-07-20 NOTE — Progress Notes (Addendum)
07/20/2022 9:28 AM   Amanda Mcgee 12-Jul-1955 [id number]  Referring provider: Sallyanne Kuster, NP [address] [place],  [place] [address]  Chief Complaint  Patient presents with   Recurrent UTI    Follow up    HPI: I was consulted to assess the patient's recurrent bladder infections.  She said 5 or 6 in the last 6 months.  She presents with burning frequency that respond favorably to antibiotics.   At baseline she has both urge and stress incontinence but equally significant.  She is small-volume bedwetting.  She wears 5-6 pads a day that are damp.  He thinks she might be infected now with mild burning yesterday   She voids every 1 hour and can sit for 2 hours if she did not drink.  Nocturia x 1.  Flow is poor and does not feel empty   She may have had 2 slings in the past.  She has had kidney stones.  She has had a hysterectomy.  She describes significant vaginal narrowing from previous surgery.   She has a past history of microhematuria.  Has a smoking history.  No daily aspirin or blood thinner.   She was treated here locally years ago with medication by Dr. Achilles Dunk     Patient has recurrent urinary tract infections. Pathophysiology and workup described. She has mixed incontinence with frequency and nocturia. She understands that we will sort out her bladder infections and then review baseline her incontinence moving forward. Will return with a CT scan and for pelvic examination and cystoscopy. I called in Septra DS 1 tablet twice a day for 7 days. Then she will start daily trimethoprim.    Last culture was positive.  CT scan within normal limits Clinically not infected on trimethoprim but is having some gas.   On pelvic examination she did have a narrow introitus.  Visually her anatomy looked reasonably normal but when I gently placed a narrow well-lubricated speculum it was uncomfortable.  At 6:00 of the posterior fourchette she had a mild separation or cut of the epithelium.   I did not do a thorough pelvic examination but she had a well supported bladder neck and leaked a mild amount after cystoscopy.  She did not have hypermobility.  I did not see or feel any mesh.  She had no significant prolapse   Cystoscopy.  Bladder mucosa and trigone were normal.  No foreign body.  No carcinoma.  Urine was clear.     The patient has recurrent bladder infections.  Because of the bloating or gas I switch her to Keflex 250 mg daily 30 x 11 and would like to reassess her in about 8 weeks.  I would see if her incontinence down regulates and set up treatment goals.  She had mild stress incontinence.  Vaginal access would be difficult especially for a vaginal sling.  A vaginal bulking agent under anesthesia would be an option for stress incontinence.  She does have mixed incontinence and bedwetting.  Call if culture positive.  She has been cleared for blood in the urine.   She does have a lot of allergies and cannot take Macrodantin.  I answered many questions about her CT scan report    Patient is on daily Keflex with no bloating.  In the last few days she has a little bit of burning.  She went on a trip last month at her primary caregiver oxybutynin with no benefit.  Still has urge incontinence and a little  bit of bedwetting.  Still leaks with coughing sneezing     Last culture was positive with 2 organisms.  She cannot take sulfa drug, ciprofloxacin, Macrodantin, or penicillin  During urodynamics patient could not void was catheterized for 25 mL.  Maximal bladder capacity was 238 mL.  Bladder she had increased bladder sensation.  Bladder was stable.  Cuff leak point pressure 200 mL was 19 cm of water with severe leak each and her Valsalva leak point pressure was 68 cmH2O with mild leakage.  Her cough leak point pressure 300 mL was 110 cm of water with severe leak easing her Valsalva leak point pressures 32 cmH2O with moderate leakage.  During voiding she voided 92 mL with a maximal flow 11  mL/s.  Maximum voiding pressure 15 cm of water.  Residual was 146 mL.  Bladder neck descent at less than a centimeter.    Patient has mixed incontinence with mild bedwetting but she has low leak point pressures.  If she underwent a bulking agent treatment it would be done under anesthesia as noted above.  She would not have a third sling.  Refractory overactive bladder treatments and living with stress incontinence is another option.  I will initially treat her with medical and behavioral therapy.  It appears she has had a breakthrough infection on daily Keflex and I will send the urine for culture.  She got bloated with trimethoprim and cannot take other medications as noted.   His urine looked positive today but she is not symptomatic.  She says she was symptomatic in April when I treated her for the positive culture.  I will reassess her in 6 weeks on Myrbetriq 25 mg samples and prescription.  We will proceed accordingly.  Check  postvoid residual when she comes back  Today Clinically stable.  Last culture negative.  Residual 29 mL.  Infection free on daily Keflex.  When asked her about her incontinence she discussed pressure or abdominal discomfort does been present almost daily for many weeks.  She says she is 50% better in terms of her incontinence but still wears 5 pads a day and she reports moderately wet.  She is on the Myrbetriq.         PMH: Past Medical History:  Diagnosis Date   Actinic keratosis    Anxiety    Arthritis    Depression    HTN (hypertension)    Hyperlipidemia    Kidney stones 2013    Surgical History: Past Surgical History:  Procedure Laterality Date   ABDOMINAL HYSTERECTOMY  1990   APPENDECTOMY  1969   BLADDER SURGERY  2011   BREAST BIOPSY Left    benign   COLONOSCOPY WITH PROPOFOL N/A 02/01/2018   Procedure: COLONOSCOPY WITH PROPOFOL;  Surgeon: Wyline Mood, MD;  Location: [facility] ENDOSCOPY;  Service: Gastroenterology;  Laterality: N/A;   LITHOTRIPSY   06/2011    Home Medications:  Allergies as of 07/20/2022       Reactions   Bactrim [sulfamethoxazole-trimethoprim]    Heart palpitations    Benadryl [diphenhydramine Hcl] Swelling   Biaxin [clarithromycin]    Ciprofloxacin    Codeine Swelling   Hydrochlorothiazide    Macrobid [nitrofurantoin] Nausea And Vomiting   Penicillins    Upset stomach   Prednisone    Aspirin Nausea Only        Medication List        Accurate as of July 20, 2022  9:28 AM. If you have any questions, ask your  nurse or doctor.          albuterol 108 (90 Base) MCG/ACT inhaler Commonly known as: VENTOLIN HFA TAKE 2 PUFFS BY MOUTH EVERY 6 HOURS AS NEEDED FOR WHEEZE OR SHORTNESS OF BREATH   ALPRAZolam 0.5 MG tablet Commonly known as: XANAX TAKE ONE TAB IN MORNING AND ONE AND HALF AT NIGHT FOR SLEEP ( THIS IS CHANGE IN DIRECTION)   amLODipine 5 MG tablet Commonly known as: NORVASC Take 1 tablet (5 mg total) by mouth daily.   buPROPion 200 MG 12 hr tablet Commonly known as: WELLBUTRIN SR Take 1 tablet (200 mg total) by mouth 2 (two) times daily.   calcium carbonate 600 MG Tabs tablet Commonly known as: OS-CAL Take 600 mg by mouth 2 (two) times daily with a meal.   cephALEXin 250 MG capsule Commonly known as: Keflex Take 1 capsule (250 mg total) by mouth daily.   CRANBERRY PO Take 1 tablet by mouth 2 (two) times daily.   desvenlafaxine 100 MG 24 hr tablet Commonly known as: PRISTIQ Take 1 tablet (100 mg total) by mouth daily.   docusate sodium 100 MG capsule Commonly known as: COLACE Take 100 mg by mouth 2 (two) times daily. prn   fluticasone 50 MCG/ACT nasal spray Commonly known as: FLONASE SPRAY 1 SPRAY INTO BOTH NOSTRILS DAILY.   levocetirizine 5 MG tablet Commonly known as: XYZAL Take 1 tablet (5 mg total) by mouth daily.   lisinopril 10 MG tablet Commonly known as: ZESTRIL Take 1 tablet (10 mg total) by mouth daily.   meclizine 25 MG tablet Commonly known as:  ANTIVERT Take twice daily as needed for dizziness   mirabegron ER 25 MG Tb24 tablet Commonly known as: Myrbetriq Take 1 tablet (25 mg total) by mouth daily.   mupirocin ointment 2 % Commonly known as: BACTROBAN Apply 1 Application topically daily. To affected area until resolved.   Vitamin D3 10 MCG (400 UNIT) Caps Take by mouth daily.        Allergies:  Allergies  Allergen Reactions   Bactrim [Sulfamethoxazole-Trimethoprim]     Heart palpitations    Benadryl [Diphenhydramine Hcl] Swelling   Biaxin [Clarithromycin]    Ciprofloxacin    Codeine Swelling   Hydrochlorothiazide    Macrobid [Nitrofurantoin] Nausea And Vomiting   Penicillins     Upset stomach    Prednisone    Aspirin Nausea Only    Family History: Family History  Problem Relation Age of Onset   Breast cancer Mother 54   Heart attack Son     Social History:  reports that she has been smoking cigarettes. She has a 15.00 pack-year smoking history. She has been exposed to tobacco smoke. She has never used smokeless tobacco. She reports current alcohol use. She reports that she does not use drugs.  ROS:                                        Physical Exam: There were no vitals taken for this visit.  Constitutional:  Alert and oriented, No acute distress. HEENT: [place] AT, moist mucus membranes.  Trachea midline, no masses.   Laboratory Data: Lab Results  Component Value Date   WBC 6.5 02/03/2022   HGB 14.0 02/03/2022   HCT 40.5 02/03/2022   MCV 92 02/03/2022   PLT 212 02/03/2022    Lab Results  Component Value Date   CREATININE  0.82 02/03/2022    No results found for: "PSA"  No results found for: "TESTOSTERONE"  No results found for: "HGBA1C"  Urinalysis    Component Value Date/Time   COLORURINE Straw 08/06/2011 1810   COLORURINE YELLOW 05/09/2010 2235   APPEARANCEUR Clear 06/01/2022 0844   LABSPEC 1.023 08/06/2011 1810   PHURINE 7.0 08/06/2011 1810   PHURINE  6.5 05/09/2010 2235   GLUCOSEU Negative 06/01/2022 0844   GLUCOSEU Negative 08/06/2011 1810   HGBUR 3+ 08/06/2011 1810   HGBUR LARGE (A) 05/09/2010 2235   BILIRUBINUR Negative 06/01/2022 0844   BILIRUBINUR Negative 08/06/2011 1810   KETONESUR Negative 08/06/2011 1810   KETONESUR NEGATIVE 05/09/2010 2235   PROTEINUR Trace (A) 06/01/2022 0844   PROTEINUR Negative 08/06/2011 1810   PROTEINUR 100 (A) 05/09/2010 2235   UROBILINOGEN 0.2 04/30/2020 1020   UROBILINOGEN 1.0 05/09/2010 2235   NITRITE Negative 06/01/2022 0844   NITRITE Negative 08/06/2011 1810   NITRITE NEGATIVE 05/09/2010 2235   LEUKOCYTESUR Negative 06/01/2022 0844   LEUKOCYTESUR 3+ 08/06/2011 1810    Pertinent Imaging:   Assessment & Plan: I will send the urine for culture.  She has had a negative CT scan and cystoscopy in my opinion abdominal discomfort not due to the bladder.  I was a bit surprised of her pad count because she said she was doing moderately better on the Myrbetriq.  Reassess in 6 weeks on Gemtesa samples and prescription.  I will then discuss bulking agent under anesthesia with her.  Call if culture positive.  Patient does concentrate a lot on her symptoms and hopefully I will be able to reach her treatment goal  1. Recurrent UTI  - Urinalysis, Complete - Bladder Scan (Post Void Residual) in office   No follow-ups on file.  Martina Sinner, MD  [facility] [address] [place], [place] [address] [phone]

## 2022-07-22 LAB — CULTURE, URINE COMPREHENSIVE

## 2022-08-12 ENCOUNTER — Other Ambulatory Visit: Payer: Self-pay | Admitting: Nurse Practitioner

## 2022-08-12 DIAGNOSIS — J300 Vasomotor rhinitis: Secondary | ICD-10-CM

## 2022-08-31 ENCOUNTER — Ambulatory Visit (INDEPENDENT_AMBULATORY_CARE_PROVIDER_SITE_OTHER): Payer: Medicare Other | Admitting: Urology

## 2022-08-31 VITALS — BP 132/77 | HR 82 | Ht 65.5 in | Wt 162.5 lb

## 2022-08-31 DIAGNOSIS — Z87440 Personal history of urinary (tract) infections: Secondary | ICD-10-CM | POA: Diagnosis not present

## 2022-08-31 DIAGNOSIS — N390 Urinary tract infection, site not specified: Secondary | ICD-10-CM

## 2022-08-31 DIAGNOSIS — N3946 Mixed incontinence: Secondary | ICD-10-CM

## 2022-08-31 LAB — MICROSCOPIC EXAMINATION

## 2022-08-31 LAB — URINALYSIS, COMPLETE
Bilirubin, UA: NEGATIVE
Glucose, UA: NEGATIVE
Ketones, UA: NEGATIVE
Leukocytes,UA: NEGATIVE
Nitrite, UA: NEGATIVE
Protein,UA: NEGATIVE
Specific Gravity, UA: 1.015 (ref 1.005–1.030)
Urobilinogen, Ur: 0.2 mg/dL (ref 0.2–1.0)
pH, UA: 7 (ref 5.0–7.5)

## 2022-08-31 MED ORDER — GEMTESA 75 MG PO TABS: 1.0000 | ORAL_TABLET | Freq: Every day | ORAL | 11 refills | Status: DC

## 2022-08-31 NOTE — Progress Notes (Signed)
08/31/2022 2:45 PM   Amanda Mcgee 1955-12-05 [id number]  Referring provider: Sallyanne Kuster, NP [address] [place],  [place] [address]  Chief Complaint  Patient presents with   Recurrent UTI    HPI:  was consulted to assess the patient's recurrent bladder infections.  She said 5 or 6 in the last 6 months.  She presents with burning frequency that respond favorably to antibiotics.   At baseline she has both urge and stress incontinence but equally significant.  She is small-volume bedwetting.  She wears 5-6 pads a day that are damp.  He thinks she might be infected now with mild burning yesterday   She voids every 1 hour and can sit for 2 hours if she did not drink.  Nocturia x 1.  Flow is poor and does not feel empty   She may have had 2 slings in the past.  She has had kidney stones.  She has had a hysterectomy.  She describes significant vaginal narrowing from previous surgery.   She has a past history of microhematuria.  Has a smoking history.  No daily aspirin or blood thinner.   She was treated here locally years ago with medication by Dr. Achilles Dunk     Patient has recurrent urinary tract infections. Pathophysiology and workup described. She has mixed incontinence with frequency and nocturia. She understands that we will sort out her bladder infections and then review baseline her incontinence moving forward. Will return with a CT scan and for pelvic examination and cystoscopy. I called in Septra DS 1 tablet twice a day for 7 days. Then she will start daily trimethoprim.    Last culture was positive.  CT scan within normal limits Clinically not infected on trimethoprim but is having some gas.   On pelvic examination she did have a narrow introitus.  Visually her anatomy looked reasonably normal but when I gently placed a narrow well-lubricated speculum it was uncomfortable.  At 6:00 of the posterior fourchette she had a mild separation or cut of the epithelium.  I did not do  a thorough pelvic examination but she had a well supported bladder neck and leaked a mild amount after cystoscopy.  She did not have hypermobility.  I did not see or feel any mesh.  She had no significant prolapse   Cystoscopy.  Bladder mucosa and trigone were normal.  No foreign body.  No carcinoma.  Urine was clear.     The patient has recurrent bladder infections.  Because of the bloating or gas I switch her to Keflex 250 mg daily 30 x 11 and would like to reassess her in about 8 weeks.  I would see if her incontinence down regulates and set up treatment goals.  She had mild stress incontinence.  Vaginal access would be difficult especially for a vaginal sling.  A vaginal bulking agent under anesthesia would be an option for stress incontinence.  She does have mixed incontinence and bedwetting.  Call if culture positive.  She has been cleared for blood in the urine.   She does have a lot of allergies and cannot take Macrodantin.  I answered many questions about her CT scan report    Patient is on daily Keflex with no bloating.  In the last few days she has a little bit of burning.  She went on a trip last month at her primary caregiver oxybutynin with no benefit.  Still has urge incontinence and a little bit of bedwetting.  Still  leaks with coughing sneezing     Last culture was positive with 2 organisms.  She cannot take sulfa drug, ciprofloxacin, Macrodantin, or penicillin   During urodynamics patient could not void was catheterized for 25 mL.  Maximal bladder capacity was 238 mL.  Bladder she had increased bladder sensation.  Bladder was stable.  Cuff leak point pressure 200 mL was 19 cm of water with severe leak each and her Valsalva leak point pressure was 68 cmH2O with mild leakage.  Her cough leak point pressure 300 mL was 110 cm of water with severe leak easing her Valsalva leak point pressures 32 cmH2O with moderate leakage.  During voiding she voided 92 mL with a maximal flow 11 mL/s.  Maximum  voiding pressure 15 cm of water.  Residual was 146 mL.  Bladder neck descent at less than a centimeter.     Patient has mixed incontinence with mild bedwetting but she has low leak point pressures.  If she underwent a bulking agent treatment it would be done under anesthesia as noted above.  She would not have a third sling.  Refractory overactive bladder treatments and living with stress incontinence is another option.  I will initially treat her with medical and behavioral therapy.  It appears she has had a breakthrough infection on daily Keflex and I will send the urine for culture.  She got bloated with trimethoprim and cannot take other medications as noted.   His urine looked positive today but she is not symptomatic.  She says she was symptomatic in April when I treated her for the positive culture.  I will reassess her in 6 weeks on Myrbetriq 25 mg samples and prescription.  We will proceed accordingly.  Check  postvoid residual when she comes back   Today Clinically stable.  Last culture negative.  Residual 29 mL.  Infection free on daily Keflex.  When asked her about her incontinence she discussed pressure or abdominal discomfort does been present almost daily for many weeks.  She says she is 50% better in terms of her incontinence but still wears 5 pads a day and she reports moderately wet.  She is on the Myrbetriq.    I will send the urine for culture.  She has had a negative CT scan and cystoscopy in my opinion abdominal discomfort not due to the bladder.  I was a bit surprised of her pad count because she said she was doing moderately better on the Myrbetriq.  Reassess in 6 weeks on Gemtesa samples and prescription.  I will then discuss bulking agent under anesthesia with her.  Call if culture positive.  Patient does concentrate a lot on her symptoms and hopefully I will be able to reach her treatment goal  Today Last culture negative.  Says she is dramatically better on Gemtesa.  She will  still use for light pads but they are just damp and very happy.  Clinically not infected.  Discomfort is gone.  She like to stay on Gemtesa.  Clinically not infected but urine sent for culture     PMH: Past Medical History:  Diagnosis Date   Actinic keratosis    Anxiety    Arthritis    Depression    HTN (hypertension)    Hyperlipidemia    Kidney stones 2013    Surgical History: Past Surgical History:  Procedure Laterality Date   ABDOMINAL HYSTERECTOMY  1990   APPENDECTOMY  1969   BLADDER SURGERY  2011   BREAST BIOPSY  Left    benign   COLONOSCOPY WITH PROPOFOL N/A 02/01/2018   Procedure: COLONOSCOPY WITH PROPOFOL;  Surgeon: Wyline Mood, MD;  Location: [facility] ENDOSCOPY;  Service: Gastroenterology;  Laterality: N/A;   LITHOTRIPSY  06/2011    Home Medications:  Allergies as of 08/31/2022       Reactions   Bactrim [sulfamethoxazole-trimethoprim]    Heart palpitations    Benadryl [diphenhydramine Hcl] Swelling   Biaxin [clarithromycin]    Ciprofloxacin    Codeine Swelling   Hydrochlorothiazide    Macrobid [nitrofurantoin] Nausea And Vomiting   Penicillins    Upset stomach   Prednisone    Aspirin Nausea Only        Medication List        Accurate as of August 31, 2022  2:45 PM. If you have any questions, ask your nurse or doctor.          albuterol 108 (90 Base) MCG/ACT inhaler Commonly known as: VENTOLIN HFA TAKE 2 PUFFS BY MOUTH EVERY 6 HOURS AS NEEDED FOR WHEEZE OR SHORTNESS OF BREATH   ALPRAZolam 0.5 MG tablet Commonly known as: XANAX TAKE ONE TAB IN MORNING AND ONE AND HALF AT NIGHT FOR SLEEP ( THIS IS CHANGE IN DIRECTION)   amLODipine 5 MG tablet Commonly known as: NORVASC Take 1 tablet (5 mg total) by mouth daily.   buPROPion 200 MG 12 hr tablet Commonly known as: WELLBUTRIN SR Take 1 tablet (200 mg total) by mouth 2 (two) times daily.   calcium carbonate 600 MG Tabs tablet Commonly known as: OS-CAL Take 600 mg by mouth 2 (two) times daily with  a meal.   cephALEXin 250 MG capsule Commonly known as: Keflex Take 1 capsule (250 mg total) by mouth daily.   CRANBERRY PO Take 1 tablet by mouth 2 (two) times daily.   desvenlafaxine 100 MG 24 hr tablet Commonly known as: PRISTIQ Take 1 tablet (100 mg total) by mouth daily.   docusate sodium 100 MG capsule Commonly known as: COLACE Take 100 mg by mouth 2 (two) times daily. prn   fluticasone 50 MCG/ACT nasal spray Commonly known as: FLONASE INSTILL 1 SPRAY INTO BOTH NOSTRILS DAILY   Gemtesa 75 MG Tabs Generic drug: Vibegron Take 1 tablet (75 mg total) by mouth daily.   levocetirizine 5 MG tablet Commonly known as: XYZAL Take 1 tablet (5 mg total) by mouth daily.   lisinopril 10 MG tablet Commonly known as: ZESTRIL Take 1 tablet (10 mg total) by mouth daily.   meclizine 25 MG tablet Commonly known as: ANTIVERT Take twice daily as needed for dizziness   mupirocin ointment 2 % Commonly known as: BACTROBAN Apply 1 Application topically daily. To affected area until resolved.   Vitamin D3 10 MCG (400 UNIT) Caps Take by mouth daily.        Allergies:  Allergies  Allergen Reactions   Bactrim [Sulfamethoxazole-Trimethoprim]     Heart palpitations    Benadryl [Diphenhydramine Hcl] Swelling   Biaxin [Clarithromycin]    Ciprofloxacin    Codeine Swelling   Hydrochlorothiazide    Macrobid [Nitrofurantoin] Nausea And Vomiting   Penicillins     Upset stomach    Prednisone    Aspirin Nausea Only    Family History: Family History  Problem Relation Age of Onset   Breast cancer Mother 87   Heart attack Son     Social History:  reports that she has been smoking cigarettes. She has a 15 pack-year smoking history. She has been  exposed to tobacco smoke. She has never used smokeless tobacco. She reports current alcohol use. She reports that she does not use drugs.  ROS:                                        Physical Exam: There were no  vitals taken for this visit.    Laboratory Data: Lab Results  Component Value Date   WBC 6.5 02/03/2022   HGB 14.0 02/03/2022   HCT 40.5 02/03/2022   MCV 92 02/03/2022   PLT 212 02/03/2022    Lab Results  Component Value Date   CREATININE 0.82 02/03/2022    No results found for: "PSA"  No results found for: "TESTOSTERONE"  No results found for: "HGBA1C"  Urinalysis    Component Value Date/Time   COLORURINE Straw 08/06/2011 1810   COLORURINE YELLOW 05/09/2010 2235   APPEARANCEUR Cloudy (A) 07/20/2022 0941   LABSPEC 1.023 08/06/2011 1810   PHURINE 7.0 08/06/2011 1810   PHURINE 6.5 05/09/2010 2235   GLUCOSEU Negative 07/20/2022 0941   GLUCOSEU Negative 08/06/2011 1810   HGBUR 3+ 08/06/2011 1810   HGBUR LARGE (A) 05/09/2010 2235   BILIRUBINUR Negative 07/20/2022 0941   BILIRUBINUR Negative 08/06/2011 1810   KETONESUR Negative 08/06/2011 1810   KETONESUR NEGATIVE 05/09/2010 2235   PROTEINUR Negative 07/20/2022 0941   PROTEINUR Negative 08/06/2011 1810   PROTEINUR 100 (A) 05/09/2010 2235   UROBILINOGEN 0.2 04/30/2020 1020   UROBILINOGEN 1.0 05/09/2010 2235   NITRITE Negative 07/20/2022 0941   NITRITE Negative 08/06/2011 1810   NITRITE NEGATIVE 05/09/2010 2235   LEUKOCYTESUR Negative 07/20/2022 0941   LEUKOCYTESUR 3+ 08/06/2011 1810    Pertinent Imaging: Urine sent for culture  Assessment & Plan: Reassess durability in 4 months on Gemtesa and call if culture positive  1. Recurrent UTI  - Urinalysis, Complete   No follow-ups on file.  Martina Sinner, MD  [facility] [address], [address] [place], [place] [address] [phone]

## 2022-09-02 ENCOUNTER — Other Ambulatory Visit: Payer: Self-pay | Admitting: Otolaryngology

## 2022-09-02 DIAGNOSIS — R42 Dizziness and giddiness: Secondary | ICD-10-CM

## 2022-09-02 DIAGNOSIS — H9041 Sensorineural hearing loss, unilateral, right ear, with unrestricted hearing on the contralateral side: Secondary | ICD-10-CM

## 2022-09-04 LAB — CULTURE, URINE COMPREHENSIVE

## 2022-09-09 ENCOUNTER — Other Ambulatory Visit: Payer: Medicare Other

## 2022-09-17 ENCOUNTER — Encounter: Payer: Self-pay | Admitting: Nurse Practitioner

## 2022-09-17 ENCOUNTER — Ambulatory Visit (INDEPENDENT_AMBULATORY_CARE_PROVIDER_SITE_OTHER): Payer: Medicare Other | Admitting: Nurse Practitioner

## 2022-09-17 VITALS — BP 132/80 | HR 91 | Temp 98.2°F | Resp 16 | Ht 65.5 in | Wt 164.6 lb

## 2022-09-17 DIAGNOSIS — J4520 Mild intermittent asthma, uncomplicated: Secondary | ICD-10-CM

## 2022-09-17 DIAGNOSIS — R42 Dizziness and giddiness: Secondary | ICD-10-CM

## 2022-09-17 DIAGNOSIS — Z1211 Encounter for screening for malignant neoplasm of colon: Secondary | ICD-10-CM

## 2022-09-17 DIAGNOSIS — Z1212 Encounter for screening for malignant neoplasm of rectum: Secondary | ICD-10-CM

## 2022-09-17 DIAGNOSIS — I10 Essential (primary) hypertension: Secondary | ICD-10-CM | POA: Diagnosis not present

## 2022-09-17 DIAGNOSIS — Z79899 Other long term (current) drug therapy: Secondary | ICD-10-CM | POA: Diagnosis not present

## 2022-09-17 DIAGNOSIS — F411 Generalized anxiety disorder: Secondary | ICD-10-CM

## 2022-09-17 MED ORDER — ALBUTEROL SULFATE HFA 108 (90 BASE) MCG/ACT IN AERS: 1.0000 | INHALATION_SPRAY | RESPIRATORY_TRACT | 1 refills | Status: DC | PRN

## 2022-09-17 MED ORDER — BUPROPION HCL ER (SR) 200 MG PO TB12
200.0000 mg | ORAL_TABLET | Freq: Two times a day (BID) | ORAL | 1 refills | Status: DC
Start: 2022-09-17 — End: 2023-04-05

## 2022-09-17 MED ORDER — ALPRAZOLAM 0.5 MG PO TABS
ORAL_TABLET | ORAL | 3 refills | Status: DC
Start: 2022-09-17 — End: 2023-01-11

## 2022-09-17 MED ORDER — ALPRAZOLAM 0.5 MG PO TABS: ORAL_TABLET | ORAL | 2 refills | Status: DC

## 2022-09-17 MED ORDER — DESVENLAFAXINE SUCCINATE ER 100 MG PO TB24: 100.0000 mg | ORAL_TABLET | Freq: Every day | ORAL | 1 refills | Status: DC

## 2022-09-17 MED ORDER — LEVOCETIRIZINE DIHYDROCHLORIDE 5 MG PO TABS: 5.0000 mg | ORAL_TABLET | Freq: Every day | ORAL | 1 refills | Status: DC

## 2022-09-17 MED ORDER — LISINOPRIL 10 MG PO TABS: 10.0000 mg | ORAL_TABLET | Freq: Every day | ORAL | 1 refills | Status: DC

## 2022-09-17 NOTE — Progress Notes (Signed)
[facility] [address] [place], [place] [address]  Internal MEDICINE  Office Visit Note  Patient Name: Amanda Mcgee  [id number]  [id number]  Date of Service: 09/17/2022  Chief Complaint  Patient presents with   Depression   Hyperlipidemia   Hypertension   Follow-up    HPI Amanda Mcgee presents for a follow-up visit for anxiety, vertigo, and CRC screening.  Anxiety -- taking alprazolam, no issues, due for refills.  Vertigo -- seen by Dr. Andee Poles, ordered brain MRI, wants to rule out stroke. Dr.Vaught also gave her some prednisone.  Due for routine colonoscopy, gave [facility] phone number to patient.  Given gemtesa by urology which is helping with incontinence and bladder spasms. Hypertension -- controlled with current medications.    Current Medication: Outpatient Encounter Medications as of 09/17/2022  Medication Sig   amLODipine (NORVASC) 5 MG tablet Take 1 tablet (5 mg total) by mouth daily.   calcium carbonate (OS-CAL) 600 MG TABS tablet Take 600 mg by mouth 2 (two) times daily with a meal.   cephALEXin (KEFLEX) 250 MG capsule Take 1 capsule (250 mg total) by mouth daily.   Cholecalciferol (VITAMIN D3) 400 units CAPS Take by mouth daily.   CRANBERRY PO Take 1 tablet by mouth 2 (two) times daily.   docusate sodium (COLACE) 100 MG capsule Take 100 mg by mouth 2 (two) times daily. prn   fluticasone (FLONASE) 50 MCG/ACT nasal spray INSTILL 1 SPRAY INTO BOTH NOSTRILS DAILY   meclizine (ANTIVERT) 25 MG tablet Take twice daily as needed for dizziness   mupirocin ointment (BACTROBAN) 2 % Apply 1 Application topically daily. To affected area until resolved.   Vibegron (GEMTESA) 75 MG TABS Take 1 tablet (75 mg total) by mouth daily.   [DISCONTINUED] albuterol (VENTOLIN HFA) 108 (90 Base) MCG/ACT inhaler TAKE 2 PUFFS BY MOUTH EVERY 6 HOURS AS NEEDED FOR WHEEZE OR SHORTNESS OF BREATH   [DISCONTINUED] ALPRAZolam (XANAX) 0.5 MG tablet TAKE ONE TAB IN MORNING AND ONE AND HALF AT  NIGHT FOR SLEEP ( THIS IS CHANGE IN DIRECTION)   [DISCONTINUED] buPROPion (WELLBUTRIN SR) 200 MG 12 hr tablet Take 1 tablet (200 mg total) by mouth 2 (two) times daily.   [DISCONTINUED] desvenlafaxine (PRISTIQ) 100 MG 24 hr tablet Take 1 tablet (100 mg total) by mouth daily.   [DISCONTINUED] levocetirizine (XYZAL) 5 MG tablet Take 1 tablet (5 mg total) by mouth daily.   [DISCONTINUED] lisinopril (ZESTRIL) 10 MG tablet Take 1 tablet (10 mg total) by mouth daily.   albuterol (VENTOLIN HFA) 108 (90 Base) MCG/ACT inhaler Inhale 1-2 puffs into the lungs every 4 (four) hours as needed for wheezing or shortness of breath.   ALPRAZolam (XANAX) 0.5 MG tablet TAKE ONE TAB IN MORNING AND ONE AND HALF AT NIGHT FOR SLEEP ( THIS IS CHANGE IN DIRECTION)   buPROPion (WELLBUTRIN SR) 200 MG 12 hr tablet Take 1 tablet (200 mg total) by mouth 2 (two) times daily.   desvenlafaxine (PRISTIQ) 100 MG 24 hr tablet Take 1 tablet (100 mg total) by mouth daily.   levocetirizine (XYZAL) 5 MG tablet Take 1 tablet (5 mg total) by mouth daily.   lisinopril (ZESTRIL) 10 MG tablet Take 1 tablet (10 mg total) by mouth daily.   [DISCONTINUED] ALPRAZolam (XANAX) 0.5 MG tablet TAKE ONE TAB IN MORNING AND ONE AND HALF AT NIGHT FOR SLEEP ( THIS IS CHANGE IN DIRECTION)   No facility-administered encounter medications on file as of 09/17/2022.    Surgical  History: Past Surgical History:  Procedure Laterality Date   ABDOMINAL HYSTERECTOMY  1990   APPENDECTOMY  1969   BLADDER SURGERY  2011   BREAST BIOPSY Left    benign   COLONOSCOPY WITH PROPOFOL N/A 02/01/2018   Procedure: COLONOSCOPY WITH PROPOFOL;  Surgeon: Wyline Mood, MD;  Location: [facility] ENDOSCOPY;  Service: Gastroenterology;  Laterality: N/A;   LITHOTRIPSY  06/2011    Medical History: Past Medical History:  Diagnosis Date   Actinic keratosis    Anxiety    Arthritis    Depression    HTN (hypertension)    Hyperlipidemia    Kidney stones 2013    Family  History: Family History  Problem Relation Age of Onset   Breast cancer Mother 29   Heart attack Son     Social History   Socioeconomic History   Marital status: Married    Spouse name: Not on file   Number of children: Not on file   Years of education: Not on file   Highest education level: Not on file  Occupational History   Not on file  Tobacco Use   Smoking status: Every Day    Current packs/day: 0.50    Average packs/day: 0.5 packs/day for 30.0 years (15.0 ttl pk-yrs)    Types: Cigarettes    Passive exposure: Current   Smokeless tobacco: Never   Tobacco comments:    1/2 pack daily  Vaping Use   Vaping status: Never Used  Substance and Sexual Activity   Alcohol use: Yes    Comment: occassionally   Drug use: No   Sexual activity: Never  Other Topics Concern   Not on file  Social History Narrative   Not on file   Social Determinants of Health   Financial Resource Strain: Not on file  Food Insecurity: Not on file  Transportation Needs: Not on file  Physical Activity: Not on file  Stress: Not on file  Social Connections: Not on file  Intimate Partner Violence: Not on file      Review of Systems  Constitutional:  Negative for chills, fatigue and unexpected weight change.  HENT:  Negative for congestion, rhinorrhea, sneezing and sore throat.   Eyes:  Negative for redness.  Respiratory:  Negative for cough, chest tightness and shortness of breath.   Cardiovascular:  Negative for chest pain and palpitations.  Gastrointestinal:  Negative for abdominal pain, constipation, diarrhea, nausea and vomiting.  Genitourinary:  Negative for dysuria and frequency.  Musculoskeletal:  Negative for arthralgias, back pain, joint swelling and neck pain.  Skin:  Negative for rash.  Neurological: Negative.  Negative for tremors and numbness.  Hematological:  Negative for adenopathy. Does not bruise/bleed easily.  Psychiatric/Behavioral:  Negative for behavioral problems  (Depression), self-injury, sleep disturbance and suicidal ideas. The patient is nervous/anxious.     Vital Signs: BP 132/80   Pulse 91   Temp 98.2 F (36.8 C)   Resp 16   Ht 5' 5.5" (1.664 m)   Wt 164 lb 9.6 oz (74.7 kg)   SpO2 96%   BMI 26.97 kg/m    Physical Exam Vitals reviewed.  Constitutional:      General: She is not in acute distress.    Appearance: Normal appearance. She is obese. She is not ill-appearing.  HENT:     Head: Normocephalic and atraumatic.     Right Ear: There is no impacted cerumen.     Left Ear: There is no impacted cerumen.  Eyes:  Pupils: Pupils are equal, round, and reactive to light.  Cardiovascular:     Rate and Rhythm: Normal rate and regular rhythm.  Pulmonary:     Effort: Pulmonary effort is normal. No respiratory distress.  Neurological:     Mental Status: She is alert and oriented to person, place, and time.  Psychiatric:        Mood and Affect: Mood normal.        Behavior: Behavior normal.        Assessment/Plan: 1. Essential hypertension Stable, Continue lisinopril as prescribed.  - lisinopril (ZESTRIL) 10 MG tablet; Take 1 tablet (10 mg total) by mouth daily.  Dispense: 90 tablet; Refill: 1  2. Vertigo Had some episodes, saw ENT, feeling better, stable   3. Mild intermittent asthma without complication Use rescue inhaler prn as prescribed. Refills ordered  - albuterol (VENTOLIN HFA) 108 (90 Base) MCG/ACT inhaler; Inhale 1-2 puffs into the lungs every 4 (four) hours as needed for wheezing or shortness of breath.  Dispense: 18 each; Refill: 1  4. Encounter for medication review Medication list reviewed, updated and refills ordered - desvenlafaxine (PRISTIQ) 100 MG 24 hr tablet; Take 1 tablet (100 mg total) by mouth daily.  Dispense: 90 tablet; Refill: 1 - buPROPion (WELLBUTRIN SR) 200 MG 12 hr tablet; Take 1 tablet (200 mg total) by mouth 2 (two) times daily.  Dispense: 180 tablet; Refill: 1 - levocetirizine (XYZAL) 5 MG  tablet; Take 1 tablet (5 mg total) by mouth daily.  Dispense: 90 tablet; Refill: 1  5. Screening for colorectal cancer Planning to call her GI to schedule her screening colonoscopy  6. Generalized anxiety disorder Continue alprazolam prn as prescribed. Follow up in about 4 months for additional refills - ALPRAZolam (XANAX) 0.5 MG tablet; TAKE ONE TAB IN MORNING AND ONE AND HALF AT NIGHT FOR SLEEP ( THIS IS CHANGE IN DIRECTION)  Dispense: 75 tablet; Refill: 3   General Counseling: Jireh verbalizes understanding of the findings of todays visit and agrees with plan of treatment. I have discussed any further diagnostic evaluation that may be needed or ordered today. We also reviewed her medications today. she has been encouraged to call the office with any questions or concerns that should arise related to todays visit.    No orders of the defined types were placed in this encounter.   Meds ordered this encounter  Medications   DISCONTD: ALPRAZolam (XANAX) 0.5 MG tablet    Sig: TAKE ONE TAB IN MORNING AND ONE AND HALF AT NIGHT FOR SLEEP ( THIS IS CHANGE IN DIRECTION)    Dispense:  75 tablet    Refill:  2    For future refills   lisinopril (ZESTRIL) 10 MG tablet    Sig: Take 1 tablet (10 mg total) by mouth daily.    Dispense:  90 tablet    Refill:  1    For future refills   desvenlafaxine (PRISTIQ) 100 MG 24 hr tablet    Sig: Take 1 tablet (100 mg total) by mouth daily.    Dispense:  90 tablet    Refill:  1    Ok to fill as 90 day prescription, for future refills   buPROPion (WELLBUTRIN SR) 200 MG 12 hr tablet    Sig: Take 1 tablet (200 mg total) by mouth 2 (two) times daily.    Dispense:  180 tablet    Refill:  1    For future refill   levocetirizine (XYZAL) 5 MG tablet  Sig: Take 1 tablet (5 mg total) by mouth daily.    Dispense:  90 tablet    Refill:  1    For future refill   albuterol (VENTOLIN HFA) 108 (90 Base) MCG/ACT inhaler    Sig: Inhale 1-2 puffs into the lungs  every 4 (four) hours as needed for wheezing or shortness of breath.    Dispense:  18 each    Refill:  1    Patient will call pharmacy if she needs refill, do not fill until she request it.   ALPRAZolam (XANAX) 0.5 MG tablet    Sig: TAKE ONE TAB IN MORNING AND ONE AND HALF AT NIGHT FOR SLEEP ( THIS IS CHANGE IN DIRECTION)    Dispense:  75 tablet    Refill:  3    Disregard previous order, this new order will replace it. For future refills    Return for previously scheduled, AWV, Manoj Enriquez PCP on 01/11/23.   Total time spent:30 Minutes Time spent includes review of chart, medications, test results, and follow up plan with the patient.   [place] Controlled Substance Database was reviewed by me.  This patient was seen by Sallyanne Kuster, FNP-C in collaboration with Dr. Beverely Risen as a part of collaborative care agreement.   Tyger Wichman R. Tedd Sias, MSN, FNP-C Internal medicine

## 2022-09-18 IMAGING — MG MM DIGITAL SCREENING BILAT W/ TOMO AND CAD
8 series · 8 of 24 positions shown · non-contrast
Comparison: Previous exam(s).

CLINICAL DATA: Screening.

EXAM:
DIGITAL SCREENING BILATERAL MAMMOGRAM WITH TOMOSYNTHESIS AND CAD
TECHNIQUE: Bilateral screening digital craniocaudal and mediolateral oblique
mammograms were obtained. Bilateral screening digital breast
tomosynthesis was performed. The images were evaluated with
computer-aided detection.

[L CC synth-2D]
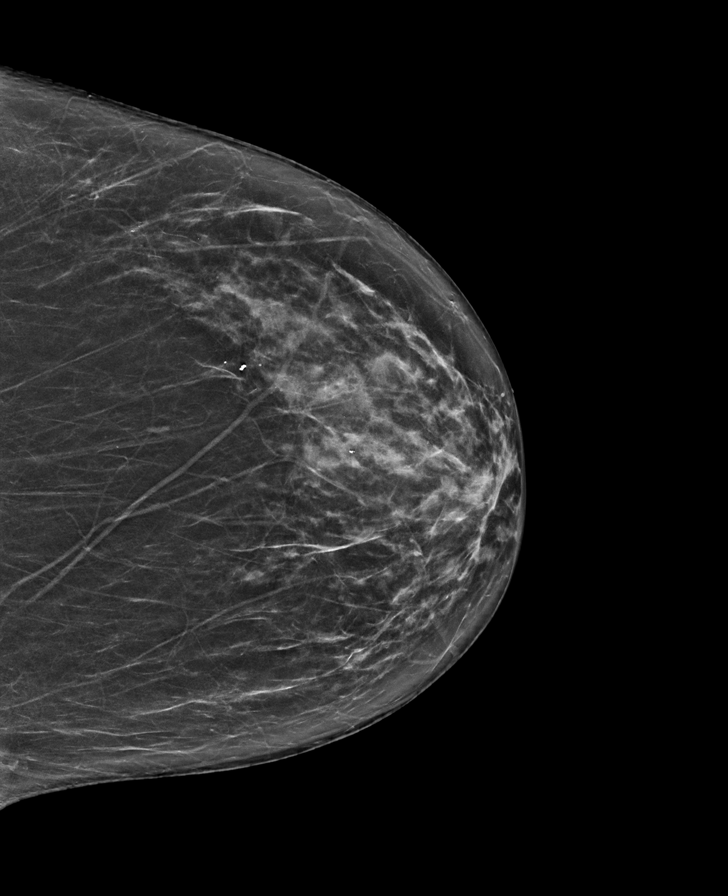

[R MLO synth-2D]
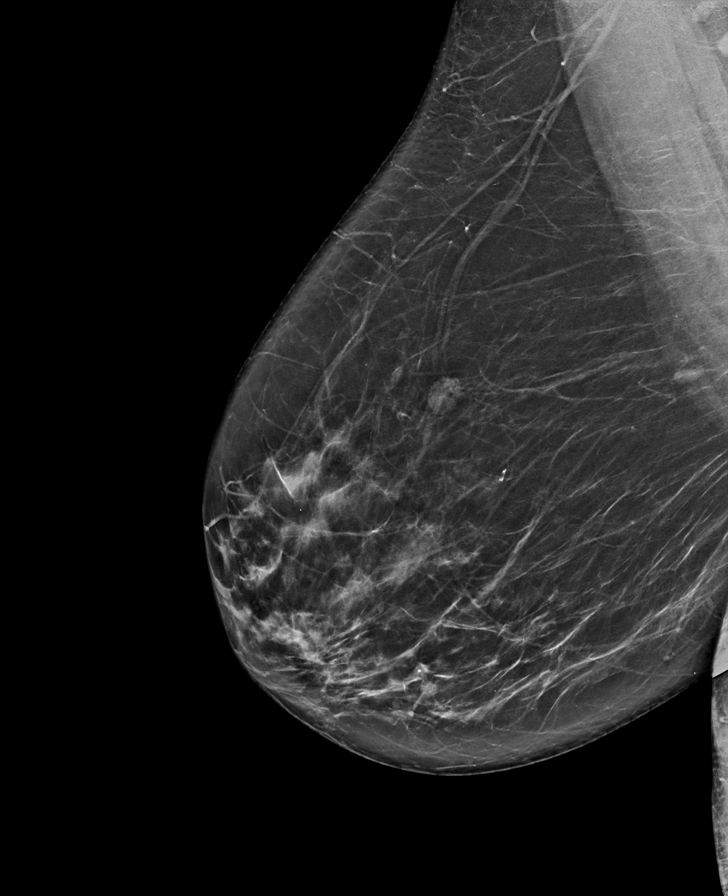

[L MLO synth-2D]
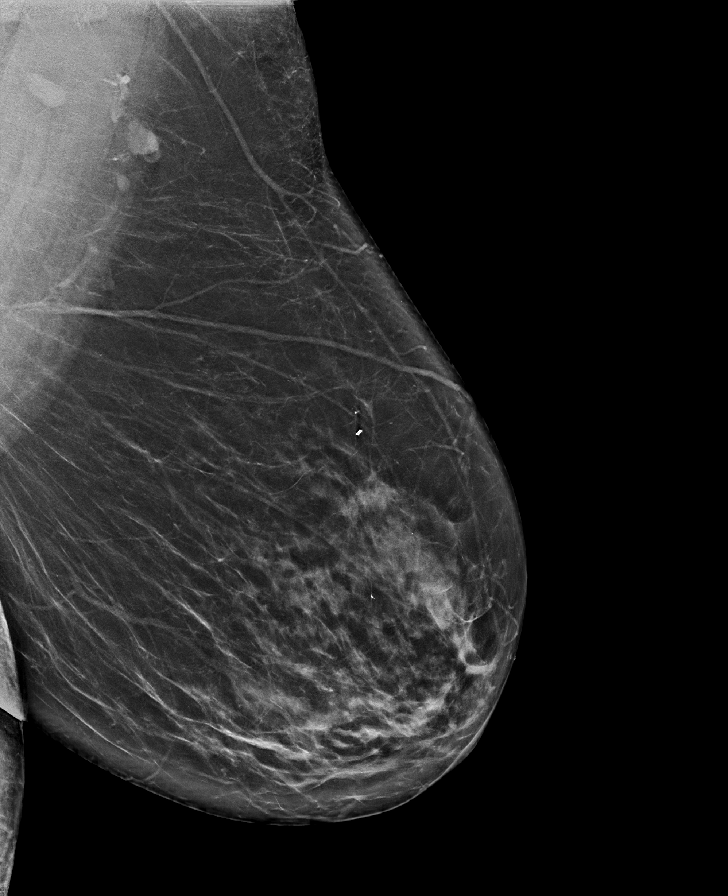

[R CC synth-2D]
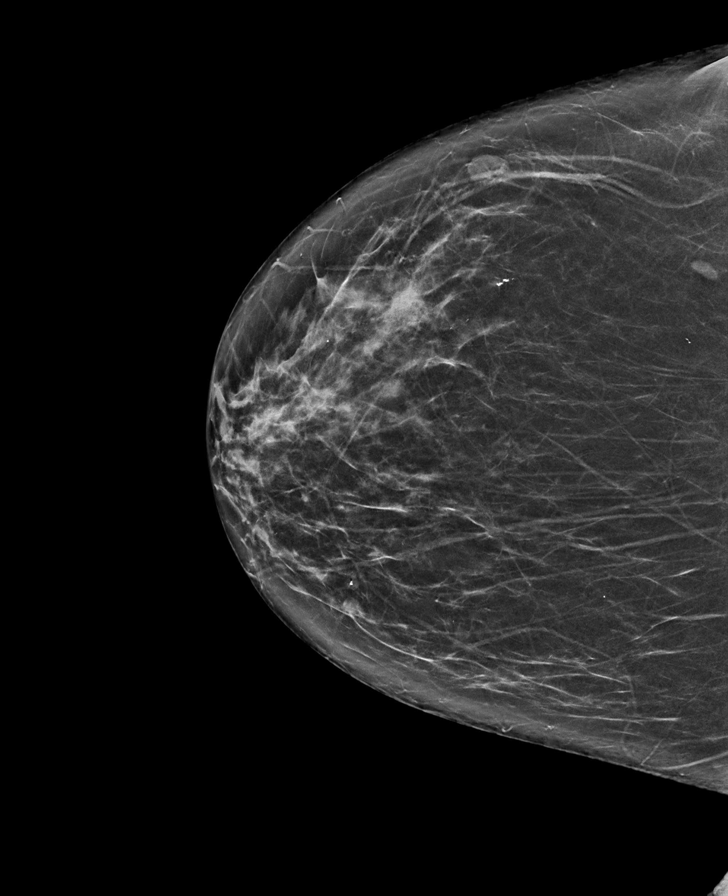

[R MLO tomo · tomo slice 39/78.0]
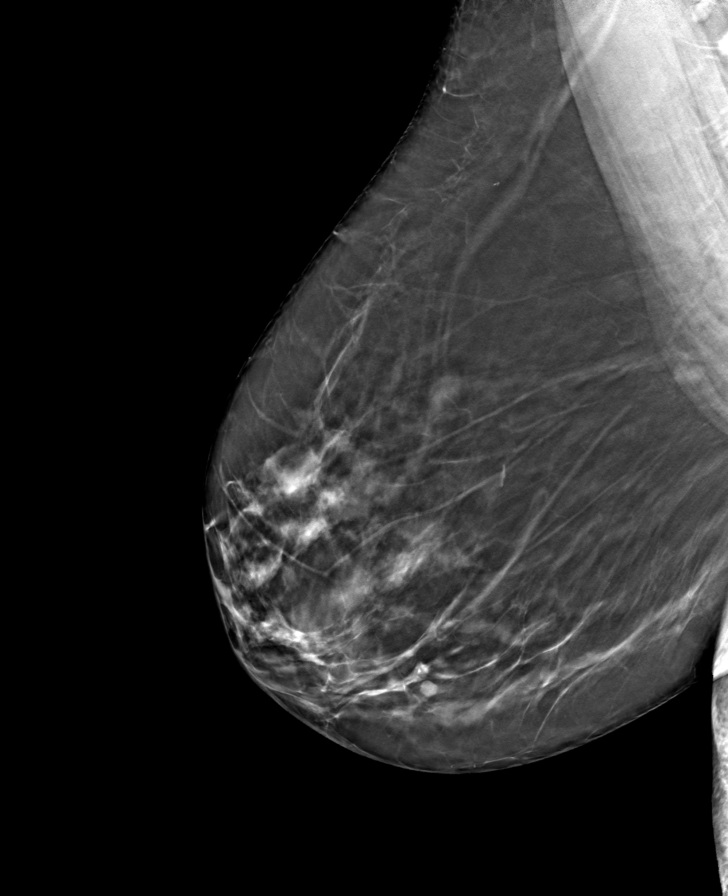

[L MLO tomo · tomo slice 42/83.0]
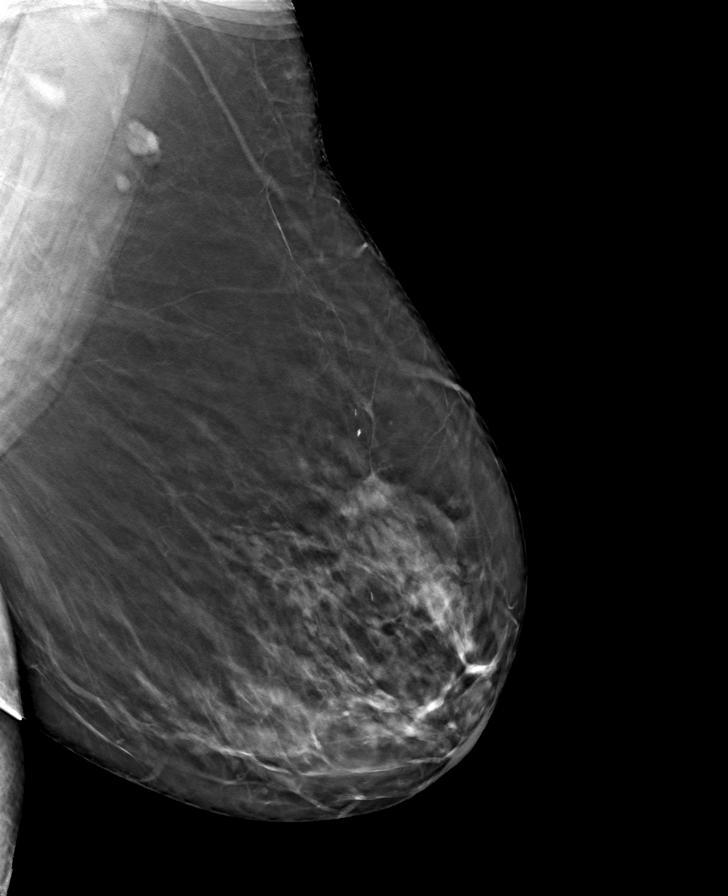

[R CC tomo · tomo slice 35/70.0]
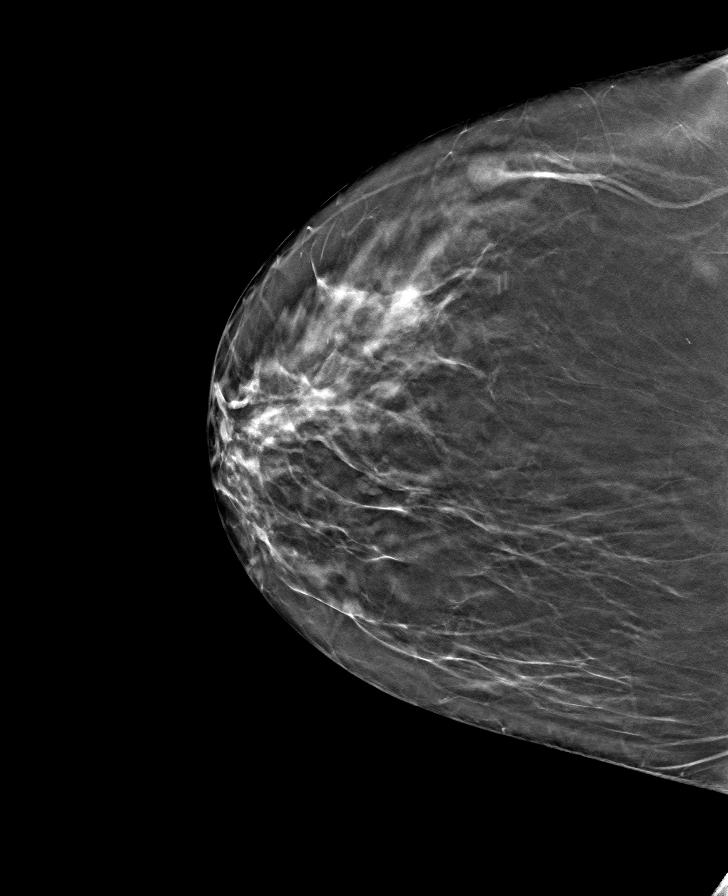

[L CC tomo · tomo slice 35/68.0]
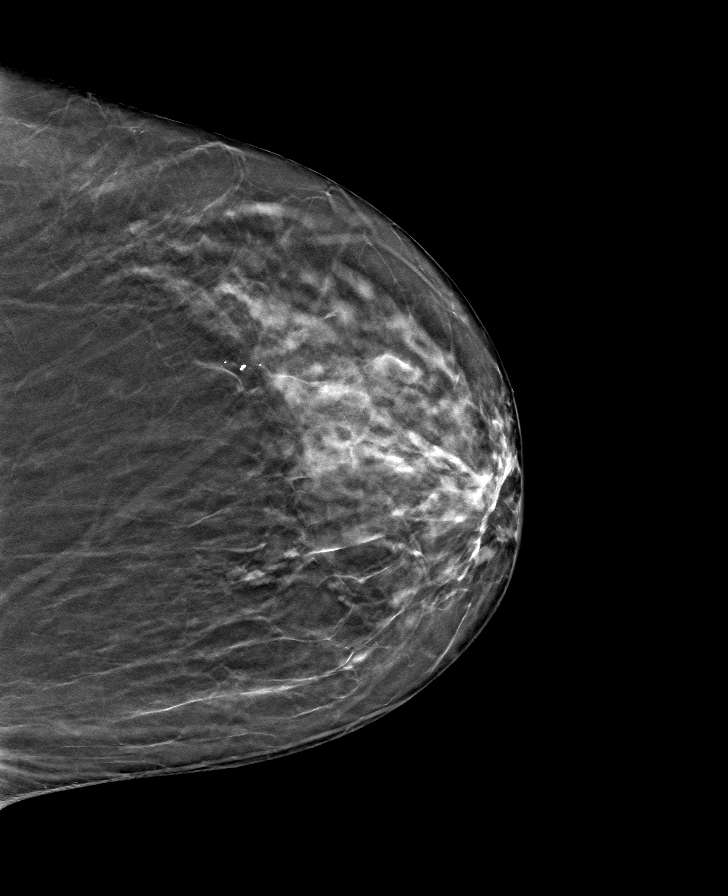

[8 of 24 positions shown; findings below may reference images not displayed]

ACR Breast Density Category c: The breast tissue is heterogeneously
dense, which may obscure small masses.
FINDINGS: There are no findings suspicious for malignancy.
IMPRESSION: No mammographic evidence of malignancy. A result letter of this
screening mammogram will be mailed directly to the patient.

RECOMMENDATION:
Screening mammogram in one year. (Code:Q3-W-BC3)

BI-RADS CATEGORY  1: Negative.

## 2022-10-05 ENCOUNTER — Other Ambulatory Visit: Payer: Medicare Other

## 2022-10-26 ENCOUNTER — Ambulatory Visit
Admission: RE | Admit: 2022-10-26 | Discharge: 2022-10-26 | Disposition: A | Discharge status: Home / Self Care | Payer: Medicare Other | Source: Ambulatory Visit | Attending: Otolaryngology | Admitting: Otolaryngology

## 2022-10-26 DIAGNOSIS — R42 Dizziness and giddiness: Secondary | ICD-10-CM

## 2022-10-26 DIAGNOSIS — H9041 Sensorineural hearing loss, unilateral, right ear, with unrestricted hearing on the contralateral side: Secondary | ICD-10-CM

## 2022-10-26 MED ORDER — GADOPICLENOL 0.5 MMOL/ML IV SOLN
7.5000 mL | Freq: Once | INTRAVENOUS | Status: AC | PRN
  Administered 2022-10-26: 7.5 mL via INTRAVENOUS

## 2022-12-02 ENCOUNTER — Telehealth: Payer: Self-pay | Admitting: Urology

## 2022-12-02 NOTE — Telephone Encounter (Signed)
Patient called and asked if she can pick up 2 more   Gemtesa samples to last until her appointment. She has an appointment with Dr. Sherron Monday on 12/21/22. She can't afford to get prescription because it's too expensive, and she said he has been giving her samples.

## 2022-12-03 NOTE — Telephone Encounter (Signed)
Spoke with patient and advised results  Samples at front office

## 2022-12-21 ENCOUNTER — Ambulatory Visit: Payer: Medicare Other | Admitting: Urology

## 2022-12-21 ENCOUNTER — Encounter: Payer: Self-pay | Admitting: Urology

## 2022-12-21 VITALS — BP 119/77 | HR 96 | Ht 65.5 in | Wt 170.0 lb

## 2022-12-21 DIAGNOSIS — Z87440 Personal history of urinary (tract) infections: Secondary | ICD-10-CM

## 2022-12-21 DIAGNOSIS — N390 Urinary tract infection, site not specified: Secondary | ICD-10-CM

## 2022-12-21 DIAGNOSIS — N3946 Mixed incontinence: Secondary | ICD-10-CM

## 2022-12-21 LAB — URINALYSIS, COMPLETE
Bilirubin, UA: NEGATIVE
Glucose, UA: NEGATIVE
Ketones, UA: NEGATIVE
Leukocytes,UA: NEGATIVE
Nitrite, UA: NEGATIVE
Protein,UA: NEGATIVE
Specific Gravity, UA: <1.005 — ABNORMAL LOW (ref 1.005–1.030)
Urobilinogen, Ur: 0.2 mg/dL (ref 0.2–1.0)
pH, UA: 6 (ref 5.0–7.5)

## 2022-12-21 LAB — MICROSCOPIC EXAMINATION: WBC, UA: NONE SEEN /[HPF] (ref 0–5)

## 2022-12-21 MED ORDER — CEPHALEXIN 250 MG PO CAPS: 250.0000 mg | ORAL_CAPSULE | Freq: Every day | ORAL | 11 refills | Status: DC

## 2022-12-21 NOTE — Progress Notes (Signed)
12/21/2022 1:44 PM   Jaynie Collins 09/07/1955 [id number]  Referring provider: Sallyanne Kuster, NP [address] [place],  [place] [address]  Chief Complaint  Patient presents with   Follow-up    HPI:  was consulted to assess the patient's recurrent bladder infections.  She said 5 or 6 in the last 6 months.  She presents with burning frequency that respond favorably to antibiotics.   At baseline she has both urge and stress incontinence but equally significant.  She is small-volume bedwetting.  She wears 5-6 pads a day that are damp.  He thinks she might be infected now with mild burning yesterday   She voids every 1 hour and can sit for 2 hours if she did not drink.  Nocturia x 1.  Flow is poor and does not feel empty   She may have had 2 slings in the past.  She has had kidney stones.  She has had a hysterectomy.  She describes significant vaginal narrowing from previous surgery.   She has a past history of microhematuria.  Has a smoking history.  No daily aspirin or blood thinner.   She was treated here locally years ago with medication by Dr. Achilles Dunk     Patient has recurrent urinary tract infections. Pathophysiology and workup described. She has mixed incontinence with frequency and nocturia. She understands that we will sort out her bladder infections and then review baseline her incontinence moving forward. Will return with a CT scan and for pelvic examination and cystoscopy. I called in Septra DS 1 tablet twice a day for 7 days. Then she will start daily trimethoprim.    Last culture was positive.  CT scan within normal limits Clinically not infected on trimethoprim but is having some gas.   On pelvic examination she did have a narrow introitus.  Visually her anatomy looked reasonably normal but when I gently placed a narrow well-lubricated speculum it was uncomfortable.  At 6:00 of the posterior fourchette she had a mild separation or cut of the epithelium.  I did not do a  thorough pelvic examination but she had a well supported bladder neck and leaked a mild amount after cystoscopy.  She did not have hypermobility.  I did not see or feel any mesh.  She had no significant prolapse   Cystoscopy.  Bladder mucosa and trigone were normal.  No foreign body.  No carcinoma.  Urine was clear.     The patient has recurrent bladder infections.  Because of the bloating or gas I switch her to Keflex 250 mg daily 30 x 11 and would like to reassess her in about 8 weeks.  I would see if her incontinence down regulates and set up treatment goals.  She had mild stress incontinence.  Vaginal access would be difficult especially for a vaginal sling.  A vaginal bulking agent under anesthesia would be an option for stress incontinence.  She does have mixed incontinence and bedwetting.  Call if culture positive.  She has been cleared for blood in the urine.   She does have a lot of allergies and cannot take Macrodantin.  I answered many questions about her CT scan report    Patient is on daily Keflex with no bloating.  In the last few days she has a little bit of burning.  She went on a trip last month at her primary caregiver oxybutynin with no benefit.  Still has urge incontinence and a little bit of bedwetting.  Still leaks  with coughing sneezing     Last culture was positive with 2 organisms.  She cannot take sulfa drug, ciprofloxacin, Macrodantin, or penicillin   During urodynamics patient could not void was catheterized for 25 mL.  Maximal bladder capacity was 238 mL.  Bladder she had increased bladder sensation.  Bladder was stable.  Cuff leak point pressure 200 mL was 19 cm of water with severe leak each and her Valsalva leak point pressure was 68 cmH2O with mild leakage.  Her cough leak point pressure 300 mL was 110 cm of water with severe leak easing her Valsalva leak point pressures 32 cmH2O with moderate leakage.  During voiding she voided 92 mL with a maximal flow 11 mL/s.  Maximum  voiding pressure 15 cm of water.  Residual was 146 mL.  Bladder neck descent at less than a centimeter.     Patient has mixed incontinence with mild bedwetting but she has low leak point pressures.  If she underwent a bulking agent treatment it would be done under anesthesia as noted above.  She would not have a third sling.  Refractory overactive bladder treatments and living with stress incontinence is another option.  I will initially treat her with medical and behavioral therapy.  It appears she has had a breakthrough infection on daily Keflex and I will send the urine for culture.  She got bloated with trimethoprim and cannot take other medications as noted.   His urine looked positive today but she is not symptomatic.  She says she was symptomatic in April when I treated her for the positive culture.  I will reassess her in 6 weeks on Myrbetriq 25 mg samples and prescription.  We will proceed accordingly.  Check  postvoid residual when she comes back   Today Clinically stable.  Last culture negative.  Residual 29 mL.  Infection free on daily Keflex.  When asked her about her incontinence she discussed pressure or abdominal discomfort does been present almost daily for many weeks.  She says she is 50% better in terms of her incontinence but still wears 5 pads a day and she reports moderately wet.  She is on the Myrbetriq.     I will send the urine for culture.  She has had a negative CT scan and cystoscopy in my opinion abdominal discomfort not due to the bladder.  I was a bit surprised of her pad count because she said she was doing moderately better on the Myrbetriq.  Reassess in 6 weeks on Gemtesa samples and prescription.  I will then discuss bulking agent under anesthesia with her.  Call if culture positive.  Patient does concentrate a lot on her symptoms and hopefully I will be able to reach her treatment goal   Today Last culture negative.  Says she is dramatically better on Gemtesa.  She  will still use for light pads but they are just damp and very happy.  Clinically not infected.  Discomfort is gone.  She like to stay on Gemtesa.  Clinically not infected but urine sent for culture  Patient reports that she was doing better on the [facility] but then when asked the quantitated she said 20% and her husband had her admit she still using the same amount of pads per day.  I have found that this has been a similar response.  It turns of the Gemtesa may be $500 per month  She does admit that urge incontinence is more bothersome than stress incontinence and she can  do reflex maneuvers.  Still asymptomatic on a once a day antibiotic and continue with the daily Keflex and urine sent for culture  I went over 3 refractory OAB therapies and gave handouts and she understands she would have residual stress incontinence.  I went over bulking agent with full template and she understands she could have persistent or worsening urge or mixed incontinence.  Retreatment rates discussed.  Other rare side effects like pain, bulking agent erosion and even mesh erosion and sequelae discussed.  Handouts were 3 refractory treatments given        PMH: Past Medical History:  Diagnosis Date   Actinic keratosis    Anxiety    Arthritis    Depression    HTN (hypertension)    Hyperlipidemia    Kidney stones 2013    Surgical History: Past Surgical History:  Procedure Laterality Date   ABDOMINAL HYSTERECTOMY  1990   APPENDECTOMY  1969   BLADDER SURGERY  2011   BREAST BIOPSY Left    benign   COLONOSCOPY WITH PROPOFOL N/A 02/01/2018   Procedure: COLONOSCOPY WITH PROPOFOL;  Surgeon: Wyline Mood, MD;  Location[facility] ENDOSCOPY;  Service: Gastroenterology;  Laterality: N/A;   LITHOTRIPSY  06/2011    Home Medications:  Allergies as of 12/21/2022       Reactions   Bactrim [sulfamethoxazole-trimethoprim]    Heart palpitations    Benadryl [diphenhydramine Hcl] Swelling   Biaxin [clarithromycin]     Ciprofloxacin    Codeine Swelling   Hydrochlorothiazide    Macrobid [nitrofurantoin] Nausea And Vomiting   Penicillins    Upset stomach   Prednisone    Aspirin Nausea Only        Medication List        Accurate as of December 21, 2022  1:44 PM. If you have any questions, ask your nurse or doctor.          albuterol 108 (90 Base) MCG/ACT inhaler Commonly known as: VENTOLIN HFA Inhale 1-2 puffs into the lungs every 4 (four) hours as needed for wheezing or shortness of breath.   ALPRAZolam 0.5 MG tablet Commonly known as: XANAX TAKE ONE TAB IN MORNING AND ONE AND HALF AT NIGHT FOR SLEEP ( THIS IS CHANGE IN DIRECTION)   amLODipine 5 MG tablet Commonly known as: NORVASC Take 1 tablet (5 mg total) by mouth daily.   buPROPion 200 MG 12 hr tablet Commonly known as: WELLBUTRIN SR Take 1 tablet (200 mg total) by mouth 2 (two) times daily.   calcium carbonate 600 MG Tabs tablet Commonly known as: OS-CAL Take 600 mg by mouth 2 (two) times daily with a meal.   cephALEXin 250 MG capsule Commonly known as: Keflex Take 1 capsule (250 mg total) by mouth daily.   CRANBERRY PO Take 1 tablet by mouth 2 (two) times daily.   desvenlafaxine 100 MG 24 hr tablet Commonly known as: PRISTIQ Take 1 tablet (100 mg total) by mouth daily.   docusate sodium 100 MG capsule Commonly known as: COLACE Take 100 mg by mouth 2 (two) times daily. prn   fluticasone 50 MCG/ACT nasal spray Commonly known as: FLONASE INSTILL 1 SPRAY INTO BOTH NOSTRILS DAILY   Gemtesa 75 MG Tabs Generic drug: Vibegron Take 1 tablet (75 mg total) by mouth daily.   levocetirizine 5 MG tablet Commonly known as: XYZAL Take 1 tablet (5 mg total) by mouth daily.   lisinopril 10 MG tablet Commonly known as: ZESTRIL Take 1 tablet (10 mg total) by  mouth daily.   meclizine 25 MG tablet Commonly known as: ANTIVERT Take twice daily as needed for dizziness   meloxicam 15 MG tablet Commonly known as: MOBIC Take 15  mg by mouth daily.   methocarbamol 500 MG tablet Commonly known as: ROBAXIN Take 500 mg by mouth 3 (three) times daily.   mupirocin ointment 2 % Commonly known as: BACTROBAN Apply 1 Application topically daily. To affected area until resolved.   Vitamin D3 10 MCG (400 UNIT) Caps Take by mouth daily.        Allergies:  Allergies  Allergen Reactions   Bactrim [Sulfamethoxazole-Trimethoprim]     Heart palpitations    Benadryl [Diphenhydramine Hcl] Swelling   Biaxin [Clarithromycin]    Ciprofloxacin    Codeine Swelling   Hydrochlorothiazide    Macrobid [Nitrofurantoin] Nausea And Vomiting   Penicillins     Upset stomach    Prednisone    Aspirin Nausea Only    Family History: Family History  Problem Relation Age of Onset   Breast cancer Mother 42   Heart attack Son     Social History:  reports that she has been smoking cigarettes. She has a 15 pack-year smoking history. She has been exposed to tobacco smoke. She has never used smokeless tobacco. She reports current alcohol use. She reports that she does not use drugs.  ROS:                                        Physical Exam: BP 119/77   Pulse 96   Ht 5' 5.5" (1.664 m)   Wt 77.1 kg   BMI 27.86 kg/m   Constitutional:  Alert and oriented, No acute distress. HEENT: [place] AT, moist mucus membranes.  Trachea midline, no masses.   Laboratory Data: Lab Results  Component Value Date   WBC 6.5 02/03/2022   HGB 14.0 02/03/2022   HCT 40.5 02/03/2022   MCV 92 02/03/2022   PLT 212 02/03/2022    Lab Results  Component Value Date   CREATININE 0.82 02/03/2022    No results found for: "PSA"  No results found for: "TESTOSTERONE"  No results found for: "HGBA1C"  Urinalysis    Component Value Date/Time   COLORURINE Straw 08/06/2011 1810   COLORURINE YELLOW 05/09/2010 2235   APPEARANCEUR Clear 08/31/2022 1450   LABSPEC 1.023 08/06/2011 1810   PHURINE 7.0 08/06/2011 1810   PHURINE 6.5  05/09/2010 2235   GLUCOSEU Negative 08/31/2022 1450   GLUCOSEU Negative 08/06/2011 1810   HGBUR 3+ 08/06/2011 1810   HGBUR LARGE (A) 05/09/2010 2235   BILIRUBINUR Negative 08/31/2022 1450   BILIRUBINUR Negative 08/06/2011 1810   KETONESUR Negative 08/06/2011 1810   KETONESUR NEGATIVE 05/09/2010 2235   PROTEINUR Negative 08/31/2022 1450   PROTEINUR Negative 08/06/2011 1810   PROTEINUR 100 (A) 05/09/2010 2235   UROBILINOGEN 0.2 04/30/2020 1020   UROBILINOGEN 1.0 05/09/2010 2235   NITRITE Negative 08/31/2022 1450   NITRITE Negative 08/06/2011 1810   NITRITE NEGATIVE 05/09/2010 2235   LEUKOCYTESUR Negative 08/31/2022 1450   LEUKOCYTESUR 3+ 08/06/2011 1810    Pertinent Imaging:   Assessment & Plan: See in 6 months on daily prophylaxis.  I thought it was best to stop the Gemtesa due to mild efficacy and cost.  I cannot give her the samples for an entire year.  She will call me when she chooses a refractory treatment and we  will proceed accordingly.  She said she can live with her stress incontinence.  I used my full template discussion on all 3 OAB therapies and on bulking agent  1. Recurrent UTI  - Urinalysis, Complete   No follow-ups on file.  Martina Sinner, MD  [facility] [address], [address] [place], [place] [address] [phone]

## 2022-12-21 NOTE — Patient Instructions (Signed)
Urgent PC office-based treatment for overactive bladder Take back control of your life! Urgent PC is a non-drug, non-surgical option for overactive bladder and associated symptoms of urinary urgency, urinary frequency and urge incontinence  Urgent-PC- Since 2003, healthcare professionals have used the Urgent PC Neuromodulation System as an effective office treatment for men and women suffering from overactive bladder, a condition commonly referred to as OAB. Urgent PC is up to 80% effective, even after conservative measures and OAB drugs have failed. Plus, Urgent PC is very low risk, making it a great choice for people unable or unwilling to have more invasive procedures.  How does Urgent PC work?  The Urgent PC system delivers a specific type of neuromodulation called percutaneous tibial nerve stimulation (PTNS). During treatment, a small, slim needle electrode is inserted near your ankle. The needle electrode is then connected to the battery-powered stimulator. During your 30-minute treatment, mild impulses from the stimulator travel through the needle electrode, along your leg and to the nerves in your pelvis that control bladder function. This process is also referred to as neuromodulation.  What will I feel with Urgent PC therapy?  Because patients may experience the sensation of the Urgent PC therapy in different ways, it's difficult to say what the treatment would feel like to you. Patients often describe the sensation as "tingling" or "pulsating." Treatment is typically well-tolerated by patients. Urgent PC offers many different levels of stimulation, so your clinician will be able to adjust treatment to suit you as well as address any discomfort that you might experience during treatment.  How often will I need Urgent PC treatments? You will receive an initial series of 12 treatments scheduled about a week apart. If you respond, you will likely need a treatment about once per month to  maintain your improvements.  How soon will I see results with Urgent PC? Because Urgent PC gently modifies the signals to achieve bladder control, it usually takes 5-7 weeks for symptoms to change. However, patients respond at different rates. In a review of about 100 patients who had success with Urgent PC, symptoms improved anywhere between 2-12 weeks. For about 20% of these patients, the symptoms of urgency and/or urge incontinence didn't improve until after 8 weeks.1  There is no way to anticipate who will respond earlier, later or not at all. That's why it is important to receive the 12 recommended treatments before you and your physician evaluate whether this therapy is an appropriate and effective choice for you.  How can I receive treatment with Urgent PC? Urgent PC is an option for patients with OAB. If you think you have OAB, talk to your doctor, a urologist or urogynecologist. If you have OAB, the doctor will work with you to determine your own personal treatment plan which usually starts with behavior and diet modifications plus medications. Urgent PC is an excellent option if these options don't work or provide sufficient improvements. Treatment with Urgent PC is typically performed at the office of a urologist, urogynecologist or gynecologist. So, if you run out of options with your normal doctor, consider visiting one of these specialists.  Does insurance cover Urgent PC? Urgent PC treatment is reimbursed by Medicare across the [place]. Private insurance coverage varies by state. To see if your insurance company covers Urgent PC, use our Coverage Finder or talk to your Healthcare Provider.  Are there patients who should not be treated with Urgent PC? Yes, these include: patients with pacemakers or implantable defibrillators, patients  prone to excessive bleeding, patients with nerve damage that could impact either percutaneous tibial nerve or pelvic floor function and patients who  are pregnant or planning to become pregnant during the duration of the treatment.  What are the risks associated with Urgent PC? The risks associated with Urgent PC therapy are low. Most common side-effects are temporary and include mild pain or skin inflammation at or near the stimulation site.   BuaBotox Instructions:  1.Patient will come in for lab appointment 2 weeks before Botox to make sure that you do not have a urinary tract infection (UTI)  2. If infection we will give you antibiotics to prevent UTI. 3. Cipro will be sent to your pharmacy to be taken, the day before the procedure, the day of the procedure and the day after the procedure. 4. Please come 30 minutes early so that we may numb your bladder with a local anesthetic and wait for it to take effect. 5.Please temporarily stop taking antiplatelets (aspirin-like products) at least 3 days before treatment

## 2022-12-30 ENCOUNTER — Encounter: Payer: Self-pay | Admitting: Urology

## 2023-01-11 ENCOUNTER — Telehealth: Payer: Self-pay | Admitting: Nurse Practitioner

## 2023-01-11 ENCOUNTER — Ambulatory Visit (INDEPENDENT_AMBULATORY_CARE_PROVIDER_SITE_OTHER): Payer: Medicare Other | Admitting: Nurse Practitioner

## 2023-01-11 ENCOUNTER — Encounter: Payer: Self-pay | Admitting: Nurse Practitioner

## 2023-01-11 VITALS — BP 135/78 | HR 80 | Temp 98.2°F | Resp 16 | Ht 65.5 in | Wt 171.0 lb

## 2023-01-11 DIAGNOSIS — Z0000 Encounter for general adult medical examination without abnormal findings: Secondary | ICD-10-CM | POA: Diagnosis not present

## 2023-01-11 DIAGNOSIS — Z79899 Other long term (current) drug therapy: Secondary | ICD-10-CM | POA: Diagnosis not present

## 2023-01-11 DIAGNOSIS — Z1231 Encounter for screening mammogram for malignant neoplasm of breast: Secondary | ICD-10-CM

## 2023-01-11 DIAGNOSIS — E2839 Other primary ovarian failure: Secondary | ICD-10-CM | POA: Diagnosis not present

## 2023-01-11 DIAGNOSIS — F411 Generalized anxiety disorder: Secondary | ICD-10-CM

## 2023-01-11 DIAGNOSIS — J300 Vasomotor rhinitis: Secondary | ICD-10-CM | POA: Diagnosis not present

## 2023-01-11 LAB — POCT URINE DRUG SCREEN
Methylenedioxyamphetamine: NOT DETECTED
POC Amphetamine UR: NOT DETECTED
POC BENZODIAZEPINES UR: POSITIVE — AB
POC Barbiturate UR: NOT DETECTED
POC Cocaine UR: NOT DETECTED
POC Ecstasy UR: NOT DETECTED
POC Marijuana UR: NOT DETECTED
POC Methadone UR: NOT DETECTED
POC Methamphetamine UR: NOT DETECTED
POC Opiate Ur: NOT DETECTED
POC Oxycodone UR: NOT DETECTED
POC PHENCYCLIDINE UR: NOT DETECTED
POC TRICYCLICS UR: NOT DETECTED

## 2023-01-11 MED ORDER — ALPRAZOLAM 0.5 MG PO TABS: ORAL_TABLET | ORAL | 3 refills | Status: DC

## 2023-01-11 MED ORDER — FLUTICASONE PROPIONATE 50 MCG/ACT NA SUSP: 1.0000 | Freq: Every day | NASAL | 3 refills | Status: DC

## 2023-01-11 NOTE — Telephone Encounter (Signed)
Notified patient of mammo & dexa appointment date, arrival time, location [facility]

## 2023-01-11 NOTE — Progress Notes (Cosign Needed)
[facility] [address] [place], [place] [address]  Internal MEDICINE  Office Visit Note  Patient Name: Amanda Mcgee  [id number]  [id number]  Date of Service: 01/11/2023  Chief Complaint  Patient presents with   Depression   Hyperlipidemia   Hypertension   Medicare Wellness    HPI Amanda Mcgee presents for an annual well visit and physical exam.  Well-appearing 67 y.o. female with hypertension, asthma, allergic rhinitis, hypothyroidism, depression and anxiety.  Routine CRC screening: will call her GI to schedule.  Routine mammogram: done in march this year.  DEXA scan: done in 2021, due now  Labs: due for routine labs  New or worsening pain: none  Other concerns: Seen by Dr. Hyacinth Meeker at [facility] recently for neck pain and trigger finger in right hand.     01/11/2023   10:14 AM 01/06/2022    9:05 AM 12/31/2020   10:18 AM  MMSE - Mini Mental State Exam  Orientation to time 5 5 5   Orientation to Place 5 5 5   Registration 3 3 3   Attention/ Calculation 5 5 5   Recall 3 3 3   Language- name 2 objects 2 2 2   Language- repeat 1 1 1   Language- follow 3 step command 3 3 3   Language- read & follow direction 1 1 1   Write a sentence 1 1 1   Copy design 1 1 1   Total score 30 30 30     Functional Status Survey: Is the patient deaf or have difficulty hearing?: No Does the patient have difficulty seeing, even when wearing glasses/contacts?: No Does the patient have difficulty concentrating, remembering, or making decisions?: No Does the patient have difficulty walking or climbing stairs?: Yes Does the patient have difficulty dressing or bathing?: No Does the patient have difficulty doing errands alone such as visiting a doctor's office or shopping?: No     07/01/2021    9:23 AM 09/24/2021    8:51 AM 01/06/2022    9:05 AM 04/03/2022    9:44 AM 01/11/2023   10:13 AM  Fall Risk  Falls in the past year? 0 1 0 0 1  Was there an injury with Fall?  0  0 0  Fall Risk  Category Calculator  1  0 1  Fall Risk Category (Retired)  Low     (RETIRED) Patient Fall Risk Level Low fall risk Low fall risk     Patient at Risk for Falls Due to No Fall Risks No Fall Risks  No Fall Risks   Fall risk Follow up Falls evaluation completed Falls evaluation completed  Falls evaluation completed Falls evaluation completed       01/11/2023    1:16 PM  Depression screen PHQ 2/9  Decreased Interest 0  Down, Depressed, Hopeless 0  PHQ - 2 Score 0        Current Medication: Outpatient Encounter Medications as of 01/11/2023  Medication Sig   albuterol (VENTOLIN HFA) 108 (90 Base) MCG/ACT inhaler Inhale 1-2 puffs into the lungs every 4 (four) hours as needed for wheezing or shortness of breath.   amLODipine (NORVASC) 5 MG tablet Take 1 tablet (5 mg total) by mouth daily.   buPROPion (WELLBUTRIN SR) 200 MG 12 hr tablet Take 1 tablet (200 mg total) by mouth 2 (two) times daily.   calcium carbonate (OS-CAL) 600 MG TABS tablet Take 600 mg by mouth 2 (two) times daily with a meal.   cephALEXin (KEFLEX) 250 MG capsule Take 1 capsule (250 mg total)  by mouth daily.   Cholecalciferol (VITAMIN D3) 400 units CAPS Take by mouth daily.   CRANBERRY PO Take 1 tablet by mouth 2 (two) times daily.   desvenlafaxine (PRISTIQ) 100 MG 24 hr tablet Take 1 tablet (100 mg total) by mouth daily.   docusate sodium (COLACE) 100 MG capsule Take 100 mg by mouth 2 (two) times daily. prn   levocetirizine (XYZAL) 5 MG tablet Take 1 tablet (5 mg total) by mouth daily.   lisinopril (ZESTRIL) 10 MG tablet Take 1 tablet (10 mg total) by mouth daily.   meclizine (ANTIVERT) 25 MG tablet Take twice daily as needed for dizziness   meloxicam (MOBIC) 15 MG tablet Take 15 mg by mouth daily.   methocarbamol (ROBAXIN) 500 MG tablet Take 500 mg by mouth 3 (three) times daily.   mupirocin ointment (BACTROBAN) 2 % Apply 1 Application topically daily. To affected area until resolved.   Vibegron (GEMTESA) 75 MG TABS  Take 1 tablet (75 mg total) by mouth daily.   [DISCONTINUED] ALPRAZolam (XANAX) 0.5 MG tablet TAKE ONE TAB IN MORNING AND ONE AND HALF AT NIGHT FOR SLEEP ( THIS IS CHANGE IN DIRECTION)   [DISCONTINUED] fluticasone (FLONASE) 50 MCG/ACT nasal spray INSTILL 1 SPRAY INTO BOTH NOSTRILS DAILY   ALPRAZolam (XANAX) 0.5 MG tablet TAKE ONE TAB IN MORNING for anxiety AND ONE AND HALF AT NIGHT FOR anxiety/sleep   fluticasone (FLONASE) 50 MCG/ACT nasal spray Place 1 spray into both nostrils daily.   No facility-administered encounter medications on file as of 01/11/2023.    Surgical History: Past Surgical History:  Procedure Laterality Date   ABDOMINAL HYSTERECTOMY  1990   APPENDECTOMY  1969   BLADDER SURGERY  2011   BREAST BIOPSY Left    benign   COLONOSCOPY WITH PROPOFOL N/A 02/01/2018   Procedure: COLONOSCOPY WITH PROPOFOL;  Surgeon: Wyline Mood, MD;  Location: [facility] ENDOSCOPY;  Service: Gastroenterology;  Laterality: N/A;   LITHOTRIPSY  06/2011    Medical History: Past Medical History:  Diagnosis Date   Actinic keratosis    Anxiety    Arthritis    Depression    HTN (hypertension)    Hyperlipidemia    Kidney stones 2013    Family History: Family History  Problem Relation Age of Onset   Breast cancer Mother 11   Heart attack Son     Social History   Socioeconomic History   Marital status: Married    Spouse name: Not on file   Number of children: Not on file   Years of education: Not on file   Highest education level: Not on file  Occupational History   Not on file  Tobacco Use   Smoking status: Every Day    Current packs/day: 0.50    Average packs/day: 0.5 packs/day for 30.0 years (15.0 ttl pk-yrs)    Types: Cigarettes    Passive exposure: Current   Smokeless tobacco: Never   Tobacco comments:    1/2 pack daily  Vaping Use   Vaping status: Never Used  Substance and Sexual Activity   Alcohol use: Yes    Comment: occassionally   Drug use: No   Sexual activity: Never   Other Topics Concern   Not on file  Social History Narrative   Not on file   Social Drivers of Health   Financial Resource Strain: Not on file  Food Insecurity: Not on file  Transportation Needs: Not on file  Physical Activity: Not on file  Stress: Not on file  Social Connections: Not on file  Intimate Partner Violence: Not on file      Review of Systems  Constitutional:  Negative for activity change, appetite change, chills, fatigue, fever and unexpected weight change.  HENT: Negative.  Negative for congestion, ear pain, rhinorrhea, sore throat and trouble swallowing.   Eyes: Negative.   Respiratory: Negative.  Negative for cough, chest tightness, shortness of breath and wheezing.   Cardiovascular: Negative.  Negative for chest pain.  Gastrointestinal: Negative.  Negative for abdominal pain, blood in stool, constipation, diarrhea, nausea and vomiting.  Endocrine: Negative.   Genitourinary: Negative.  Negative for difficulty urinating, dysuria, frequency, hematuria and urgency.  Musculoskeletal: Negative.  Negative for arthralgias, back pain, joint swelling, myalgias and neck pain.  Skin: Negative.  Negative for rash and wound.  Allergic/Immunologic: Negative.  Negative for immunocompromised state.  Neurological: Negative.  Negative for dizziness, seizures, numbness and headaches.  Hematological: Negative.   Psychiatric/Behavioral:  Negative for behavioral problems, self-injury and suicidal ideas. The patient is not nervous/anxious.     Vital Signs: BP 135/78   Pulse 80   Temp 98.2 F (36.8 C)   Resp 16   Ht 5' 5.5" (1.664 m)   Wt 171 lb (77.6 kg)   SpO2 96%   BMI 28.02 kg/m    Physical Exam Vitals reviewed.  Constitutional:      General: She is awake. She is not in acute distress.    Appearance: Normal appearance. She is well-developed, well-groomed and normal weight. She is not ill-appearing or diaphoretic.  HENT:     Head: Normocephalic and atraumatic.      Mouth/Throat:     Lips: Pink.     Pharynx: Uvula midline.  Eyes:     General: Lids are normal. Vision grossly intact. Gaze aligned appropriately.     Extraocular Movements: Extraocular movements intact.     Conjunctiva/sclera: Conjunctivae normal.     Pupils: Pupils are equal, round, and reactive to light.     Funduscopic exam:    Right eye: Red reflex present.        Left eye: Red reflex present. Neck:     Thyroid: No thyromegaly.     Vascular: No JVD.     Trachea: No tracheal deviation.  Cardiovascular:     Rate and Rhythm: Normal rate and regular rhythm.     Pulses: Normal pulses.     Heart sounds: Normal heart sounds, S1 normal and S2 normal. No murmur heard.    No friction rub. No gallop.  Pulmonary:     Effort: No accessory muscle usage or respiratory distress.     Breath sounds: Normal breath sounds and air entry. No stridor. No wheezing or rales.  Chest:     Chest wall: No tenderness.  Breasts:    Right: Normal. No swelling, bleeding, inverted nipple, mass, nipple discharge, skin change or tenderness.     Left: Normal. No swelling, bleeding, inverted nipple, mass, nipple discharge, skin change or tenderness.  Lymphadenopathy:     Upper Body:     Right upper body: No supraclavicular, axillary or pectoral adenopathy.     Left upper body: No supraclavicular, axillary or pectoral adenopathy.  Skin:    Capillary Refill: Capillary refill takes less than 2 seconds.  Neurological:     Mental Status: She is alert and oriented to person, place, and time.     Cranial Nerves: No cranial nerve deficit.     Motor: No abnormal muscle tone.  Coordination: Coordination normal.     Gait: Gait normal.     Deep Tendon Reflexes: Reflexes are normal and symmetric.  Psychiatric:        Mood and Affect: Mood and affect normal.        Behavior: Behavior normal. Behavior is cooperative.        Assessment/Plan: 1. Encounter for subsequent annual wellness visit (AWV) in Medicare  patient (Primary) Age-appropriate preventive screenings and vaccinations discussed, annual physical exam completed. Routine labs for health maintenance will be ordered, requisition form given to patient. PHM updated.    2. Vasomotor rhinitis Continue flonase as prescribed.  - fluticasone (FLONASE) 50 MCG/ACT nasal spray; Place 1 spray into both nostrils daily.  Dispense: 32 mL; Refill: 3  3. Ovarian failure due to menopause Dexa scan ordered  - DG Bone Density; Future  4. Encounter for long-term (current) drug use UDS done, positive for benzodiazepines which is consistent with her current prescription.  - POCT Urine Drug Screen  5. Encounter for screening mammogram for malignant neoplasm of breast Routine mammogram ordered - MM 3D SCREENING MAMMOGRAM BILATERAL BREAST; Future  6. Generalized anxiety disorder Continue alprazolam as prescribed. Follow up in 3 months for additional refills.  - ALPRAZolam (XANAX) 0.5 MG tablet; TAKE ONE TAB IN MORNING for anxiety AND ONE AND HALF AT NIGHT FOR anxiety/sleep  Dispense: 75 tablet; Refill: 3     General Counseling: Amanda Mcgee verbalizes understanding of the findings of todays visit and agrees with plan of treatment. I have discussed any further diagnostic evaluation that may be needed or ordered today. We also reviewed her medications today. she has been encouraged to call the office with any questions or concerns that should arise related to todays visit.    Orders Placed This Encounter  Procedures   MM 3D SCREENING MAMMOGRAM BILATERAL BREAST   DG Bone Density   POCT Urine Drug Screen    Meds ordered this encounter  Medications   fluticasone (FLONASE) 50 MCG/ACT nasal spray    Sig: Place 1 spray into both nostrils daily.    Dispense:  32 mL    Refill:  3   ALPRAZolam (XANAX) 0.5 MG tablet    Sig: TAKE ONE TAB IN MORNING for anxiety AND ONE AND HALF AT NIGHT FOR anxiety/sleep    Dispense:  75 tablet    Refill:  3    For future  refills, keep on file    Return in about 3 months (around 04/07/2023) for F/U, anxiety med refill, Simran Mannis PCP.   Total time spent:30 Minutes Time spent includes review of chart, medications, test results, and follow up plan with the patient.   [place] Controlled Substance Database was reviewed by me.  This patient was seen by Sallyanne Kuster, FNP-C in collaboration with Dr. Beverely Risen as a part of collaborative care agreement.  Angas Isabell R. Tedd Sias, MSN, FNP-C Internal medicine

## 2023-01-25 ENCOUNTER — Telehealth: Payer: Self-pay | Admitting: Urology

## 2023-01-25 NOTE — Telephone Encounter (Signed)
 Sent for benefit verification to [facility] 01/22/2023

## 2023-01-25 NOTE — Telephone Encounter (Signed)
 She would like you to give her a call back. She stated that you were checking on something for her.

## 2023-01-26 NOTE — Telephone Encounter (Signed)
 Spoke with patient and advised that I have sent information to botox one for verification.

## 2023-02-04 ENCOUNTER — Other Ambulatory Visit: Payer: Self-pay | Admitting: Nurse Practitioner

## 2023-02-05 LAB — LIPID PANEL
Chol/HDL Ratio: 5.4 {ratio} — ABNORMAL HIGH (ref 0.0–4.4)
Cholesterol, Total: 260 mg/dL — ABNORMAL HIGH (ref 100–199)
HDL: 48 mg/dL (ref >39)
LDL Chol Calc (NIH): 191 mg/dL — ABNORMAL HIGH (ref 0–99)
Triglycerides: 116 mg/dL (ref 0–149)
VLDL Cholesterol Cal: 21 mg/dL (ref 5–40)

## 2023-02-05 LAB — CBC WITH DIFFERENTIAL/PLATELET
Basophils Absolute: 0 10*3/uL (ref 0.0–0.2)
Basos: 1 %
EOS (ABSOLUTE): 0.1 10*3/uL (ref 0.0–0.4)
Eos: 1 %
Hematocrit: 42.4 % (ref 34.0–46.6)
Hemoglobin: 14.4 g/dL (ref 11.1–15.9)
Immature Grans (Abs): 0 10*3/uL (ref 0.0–0.1)
Immature Granulocytes: 0 %
Lymphocytes Absolute: 2 10*3/uL (ref 0.7–3.1)
Lymphs: 28 %
MCH: 31.9 pg (ref 26.6–33.0)
MCHC: 34 g/dL (ref 31.5–35.7)
MCV: 94 fL (ref 79–97)
Monocytes Absolute: 0.6 10*3/uL (ref 0.1–0.9)
Monocytes: 8 %
Neutrophils Absolute: 4.5 10*3/uL (ref 1.4–7.0)
Neutrophils: 62 %
Platelets: 222 10*3/uL (ref 150–450)
RBC: 4.52 x10E6/uL (ref 3.77–5.28)
RDW: 12.1 % (ref 11.7–15.4)
WBC: 7.3 10*3/uL (ref 3.4–10.8)

## 2023-02-05 LAB — COMPREHENSIVE METABOLIC PANEL
ALT: 15 [IU]/L (ref 0–32)
AST: 12 [IU]/L (ref 0–40)
Albumin: 4.3 g/dL (ref 3.9–4.9)
Alkaline Phosphatase: 96 [IU]/L (ref 44–121)
BUN/Creatinine Ratio: 15 (ref 12–28)
BUN: 12 mg/dL (ref 8–27)
Bilirubin Total: 0.3 mg/dL (ref 0.0–1.2)
CO2: 25 mmol/L (ref 20–29)
Calcium: 9.5 mg/dL (ref 8.7–10.3)
Chloride: 100 mmol/L (ref 96–106)
Creatinine, Ser: 0.81 mg/dL (ref 0.57–1.00)
Globulin, Total: 2.3 g/dL (ref 1.5–4.5)
Glucose: 125 mg/dL — ABNORMAL HIGH (ref 70–99)
Potassium: 4.4 mmol/L (ref 3.5–5.2)
Sodium: 140 mmol/L (ref 134–144)
Total Protein: 6.6 g/dL (ref 6.0–8.5)
eGFR: 79 mL/min/{1.73_m2} (ref >59)

## 2023-02-05 LAB — TSH: TSH: 1.95 u[IU]/mL (ref 0.450–4.500)

## 2023-02-05 LAB — T4, FREE: Free T4: 0.81 ng/dL — ABNORMAL LOW (ref 0.82–1.77)

## 2023-02-05 LAB — VITAMIN D 25 HYDROXY (VIT D DEFICIENCY, FRACTURES): Vit D, 25-Hydroxy: 33.4 ng/mL (ref 30.0–100.0)

## 2023-02-05 LAB — B12 AND FOLATE PANEL
Folate: 9.1 ng/mL (ref >3.0)
Vitamin B-12: 1193 pg/mL (ref 232–1245)

## 2023-02-05 LAB — HGB A1C W/O EAG: Hgb A1c MFr Bld: 6.2 % — ABNORMAL HIGH (ref 4.8–5.6)

## 2023-02-08 ENCOUNTER — Encounter: Payer: Self-pay | Admitting: *Deleted

## 2023-02-08 MED ORDER — CIPROFLOXACIN HCL 500 MG PO TABS: ORAL_TABLET | ORAL | 0 refills | Status: DC

## 2023-02-08 NOTE — Addendum Note (Signed)
Addended by: Sueanne Margarita on: 02/08/2023 02:25 PM   Modules accepted: Orders

## 2023-02-08 NOTE — Telephone Encounter (Signed)
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   Botox verification completed- Yes  Pa needed- No  Insurance contacted - Pa initiated/ complete        Auth number:____[id number]____ _ Approval dates : _1/22/25- 01/13/24  Denied:______________   Botox scheduled:___2/24/2025______________  U/A & Culture ordered and scheduled:__02/14/2025__________________  Pt aware and instructions given.   JQ aware to order Botox.   Date: 02/08/2023

## 2023-02-22 ENCOUNTER — Other Ambulatory Visit: Payer: Self-pay

## 2023-02-22 DIAGNOSIS — N390 Urinary tract infection, site not specified: Secondary | ICD-10-CM

## 2023-02-24 ENCOUNTER — Telehealth: Payer: Self-pay | Admitting: Pharmacy Technician

## 2023-02-24 NOTE — Telephone Encounter (Signed)
Auth Submission: NO AUTH NEEDED Site of care: [facility] Payer: MEDICARE A/B & BCBS SUPP Medication & CPT/J Code(s) submitted: BOTOX [id number] Route of submission (phone, fax, portal):  Phone # Fax # Auth type: Buy/Bill HB Units/visits requested: 100 UNITS Reference number:  Approval from: 02/24/23 to 01/12/24     Medicare will cover 80% and BCBS will pick-up remaining 20%

## 2023-02-26 ENCOUNTER — Other Ambulatory Visit: Payer: Medicare Other

## 2023-02-26 DIAGNOSIS — N390 Urinary tract infection, site not specified: Secondary | ICD-10-CM

## 2023-02-26 LAB — URINALYSIS, COMPLETE
Bilirubin, UA: NEGATIVE
Glucose, UA: NEGATIVE
Ketones, UA: NEGATIVE
Leukocytes,UA: NEGATIVE
Nitrite, UA: NEGATIVE
Protein,UA: NEGATIVE
Specific Gravity, UA: 1.02 (ref 1.005–1.030)
Urobilinogen, Ur: 0.2 mg/dL (ref 0.2–1.0)
pH, UA: 6 (ref 5.0–7.5)

## 2023-02-26 LAB — MICROSCOPIC EXAMINATION

## 2023-03-01 LAB — CULTURE, URINE COMPREHENSIVE

## 2023-03-05 NOTE — Telephone Encounter (Signed)
Pt has a question about her Botox scheduled for Monday w/Dr. Sherron Monday.

## 2023-03-08 ENCOUNTER — Ambulatory Visit (INDEPENDENT_AMBULATORY_CARE_PROVIDER_SITE_OTHER): Payer: Medicare Other | Admitting: Urology

## 2023-03-08 ENCOUNTER — Encounter: Payer: Self-pay | Admitting: Urology

## 2023-03-08 VITALS — BP 123/77 | HR 84 | Ht 65.0 in | Wt 165.4 lb

## 2023-03-08 DIAGNOSIS — N3946 Mixed incontinence: Secondary | ICD-10-CM | POA: Diagnosis not present

## 2023-03-08 LAB — URINALYSIS, COMPLETE
Bilirubin, UA: NEGATIVE
Glucose, UA: NEGATIVE
Ketones, UA: NEGATIVE
Leukocytes,UA: NEGATIVE
Nitrite, UA: NEGATIVE
Specific Gravity, UA: 1.025 (ref 1.005–1.030)
Urobilinogen, Ur: 0.2 mg/dL (ref 0.2–1.0)
pH, UA: 7.5 (ref 5.0–7.5)

## 2023-03-08 LAB — MICROSCOPIC EXAMINATION

## 2023-03-08 MED ORDER — ONABOTULINUMTOXINA 100 UNITS IJ SOLR
100.0000 [IU] | Freq: Once | INTRAMUSCULAR | Status: AC
Start: 2023-03-08 — End: 2023-03-08
  Administered 2023-03-08: 100 [IU] via INTRAMUSCULAR

## 2023-03-08 MED ORDER — CEPHALEXIN 250 MG PO CAPS: 250.0000 mg | ORAL_CAPSULE | Freq: Every day | ORAL | 11 refills | Status: AC

## 2023-03-08 NOTE — Addendum Note (Signed)
 Addended byRanda Lynn on: 03/08/2023 01:34 PM   Modules accepted: Orders

## 2023-03-08 NOTE — Progress Notes (Signed)
 Bladder Instillation  Due to Botox injections  patient is present today for a Bladder Instillation of 2 % lidocaine. Patient was cleaned and prepped in a sterile fashion with betadine and lidocaine 2% jelly was instilled into the urethra.  A 14 FR catheter was inserted, urine return was noted 100 ml, urine was clear yellow in color.  60  ml was instilled into the bladder. The catheter was then removed. Patient tolerated well, no complications were noted. Patient held in bladder for 30 minutes prior to procedure starting.   Performed by: Randa Lynn, RMA

## 2023-03-08 NOTE — Progress Notes (Signed)
 03/08/2023 10:58 AM   Amanda Mcgee 12/19/1955 [id number]  Referring provider: Sallyanne Kuster, NP [address] [place],  [place] [address]  Chief Complaint  Patient presents with   Botulinum Toxin Injection    HPI: Reviewed lengthy note.  Last visit reviewed 3 refractory OAB therapies as well as bulking agent treatment  On daily Keflex for urinary tract infections.  Recent urine culture negative.  Clinically not infected.  Frequency stable.  Urge incontinence a bit worse.  On pelvic examination she had grade 2 hypermobility the bladder neck and minimal cystocele.  Cystoscopy and Botox: Patient underwent cystoscopy.  Bladder mucosa and trigone were normal.  No cystitis.  No carcinoma.  I injected 100 units of Botox and 10 cc of normal saline 1 cc/inj. with my modified template that was more midline and somewhat lateral.  Each injection she felt the needle but then did well with the injection.  No bleeding.  She is still on Gemtesa as a partial responder     PMH: Past Medical History:  Diagnosis Date   Actinic keratosis    Anxiety    Arthritis    Depression    HTN (hypertension)    Hyperlipidemia    Kidney stones 2013    Surgical History: Past Surgical History:  Procedure Laterality Date   ABDOMINAL HYSTERECTOMY  1990   APPENDECTOMY  1969   BLADDER SURGERY  2011   BREAST BIOPSY Left    benign   COLONOSCOPY WITH PROPOFOL N/A 02/01/2018   Procedure: COLONOSCOPY WITH PROPOFOL;  Surgeon: Wyline Mood, MD;  Location: [facility] ENDOSCOPY;  Service: Gastroenterology;  Laterality: N/A;   LITHOTRIPSY  06/2011    Home Medications:  Allergies as of 03/08/2023       Reactions   Bactrim [sulfamethoxazole-trimethoprim]    Heart palpitations    Benadryl [diphenhydramine Hcl] Swelling   Biaxin [clarithromycin]    Ciprofloxacin    Codeine Swelling   Hydrochlorothiazide    Macrobid [nitrofurantoin] Nausea And Vomiting   Penicillins    Upset stomach   Prednisone     Aspirin Nausea Only        Medication List        Accurate as of March 08, 2023 10:58 AM. If you have any questions, ask your nurse or doctor.          albuterol 108 (90 Base) MCG/ACT inhaler Commonly known as: VENTOLIN HFA Inhale 1-2 puffs into the lungs every 4 (four) hours as needed for wheezing or shortness of breath.   ALPRAZolam 0.5 MG tablet Commonly known as: XANAX TAKE ONE TAB IN MORNING for anxiety AND ONE AND HALF AT NIGHT FOR anxiety/sleep   amLODipine 5 MG tablet Commonly known as: NORVASC Take 1 tablet (5 mg total) by mouth daily.   buPROPion 200 MG 12 hr tablet Commonly known as: WELLBUTRIN SR Take 1 tablet (200 mg total) by mouth 2 (two) times daily.   calcium carbonate 600 MG Tabs tablet Commonly known as: OS-CAL Take 600 mg by mouth 2 (two) times daily with a meal.   cephALEXin 250 MG capsule Commonly known as: Keflex Take 1 capsule (250 mg total) by mouth daily.   ciprofloxacin 500 MG tablet Commonly known as: CIPRO Take 1 tablet the day before, the day of and the day after the procedure   CRANBERRY PO Take 1 tablet by mouth 2 (two) times daily.   desvenlafaxine 100 MG 24 hr tablet Commonly known as: PRISTIQ Take 1 tablet (100 mg total) by  mouth daily.   docusate sodium 100 MG capsule Commonly known as: COLACE Take 100 mg by mouth 2 (two) times daily. prn   fluticasone 50 MCG/ACT nasal spray Commonly known as: FLONASE Place 1 spray into both nostrils daily.   Gemtesa 75 MG Tabs Generic drug: Vibegron Take 1 tablet (75 mg total) by mouth daily.   levocetirizine 5 MG tablet Commonly known as: XYZAL Take 1 tablet (5 mg total) by mouth daily.   lisinopril 10 MG tablet Commonly known as: ZESTRIL Take 1 tablet (10 mg total) by mouth daily.   meclizine 25 MG tablet Commonly known as: ANTIVERT Take twice daily as needed for dizziness   meloxicam 15 MG tablet Commonly known as: MOBIC Take 15 mg by mouth daily.   methocarbamol  500 MG tablet Commonly known as: ROBAXIN Take 500 mg by mouth 3 (three) times daily.   mupirocin ointment 2 % Commonly known as: BACTROBAN Apply 1 Application topically daily. To affected area until resolved.   Vitamin D3 10 MCG (400 UNIT) Caps Take by mouth daily.        Allergies:  Allergies  Allergen Reactions   Bactrim [Sulfamethoxazole-Trimethoprim]     Heart palpitations    Benadryl [Diphenhydramine Hcl] Swelling   Biaxin [Clarithromycin]    Ciprofloxacin    Codeine Swelling   Hydrochlorothiazide    Macrobid [Nitrofurantoin] Nausea And Vomiting   Penicillins     Upset stomach    Prednisone    Aspirin Nausea Only    Family History: Family History  Problem Relation Age of Onset   Breast cancer Mother 53   Heart attack Son     Social History:  reports that she has been smoking cigarettes. She has a 15 pack-year smoking history. She has been exposed to tobacco smoke. She has never used smokeless tobacco. She reports current alcohol use. She reports that she does not use drugs.  ROS:                                        Physical Exam: There were no vitals taken for this visit.  Constitutional:  Alert and oriented, No acute distress. HEENT: [place] AT, moist mucus membranes.  Trachea midline, no masses.   Laboratory Data: Lab Results  Component Value Date   WBC 7.3 02/04/2023   HGB 14.4 02/04/2023   HCT 42.4 02/04/2023   MCV 94 02/04/2023   PLT 222 02/04/2023    Lab Results  Component Value Date   CREATININE 0.81 02/04/2023    No results found for: "PSA"  No results found for: "TESTOSTERONE"  Lab Results  Component Value Date   HGBA1C 6.2 (H) 02/04/2023    Urinalysis    Component Value Date/Time   COLORURINE Straw 08/06/2011 1810   COLORURINE YELLOW 05/09/2010 2235   APPEARANCEUR Clear 02/26/2023 0958   LABSPEC 1.023 08/06/2011 1810   PHURINE 7.0 08/06/2011 1810   PHURINE 6.5 05/09/2010 2235   GLUCOSEU Negative  02/26/2023 0958   GLUCOSEU Negative 08/06/2011 1810   HGBUR 3+ 08/06/2011 1810   HGBUR LARGE (A) 05/09/2010 2235   BILIRUBINUR Negative 02/26/2023 0958   BILIRUBINUR Negative 08/06/2011 1810   KETONESUR Negative 08/06/2011 1810   KETONESUR NEGATIVE 05/09/2010 2235   PROTEINUR Negative 02/26/2023 0958   PROTEINUR Negative 08/06/2011 1810   PROTEINUR 100 (A) 05/09/2010 2235   UROBILINOGEN 0.2 04/30/2020 1020   UROBILINOGEN 1.0 05/09/2010  2235   NITRITE Negative 02/26/2023 0958   NITRITE Negative 08/06/2011 1810   NITRITE NEGATIVE 05/09/2010 2235   LEUKOCYTESUR Negative 02/26/2023 0958   LEUKOCYTESUR 3+ 08/06/2011 1810    Pertinent Imaging: Urine reviewed and sent for culture  Assessment & Plan: Patient will be followed as per protocol.  Hopefully the Botox will greatly improve her continence status  1. Mixed incontinence (Primary)  - Urinalysis, Complete   No follow-ups on file.  Martina Sinner, MD  [facility] [address], [address] [place], [place] [address] [phone]

## 2023-03-22 ENCOUNTER — Ambulatory Visit (INDEPENDENT_AMBULATORY_CARE_PROVIDER_SITE_OTHER): Payer: Medicare Other | Admitting: Physician Assistant

## 2023-03-22 VITALS — BP 149/86 | HR 87 | Ht 65.0 in | Wt 165.0 lb

## 2023-03-22 DIAGNOSIS — N390 Urinary tract infection, site not specified: Secondary | ICD-10-CM | POA: Diagnosis not present

## 2023-03-22 DIAGNOSIS — N3946 Mixed incontinence: Secondary | ICD-10-CM | POA: Diagnosis not present

## 2023-03-22 LAB — URINALYSIS, COMPLETE
Bilirubin, UA: NEGATIVE
Glucose, UA: NEGATIVE
Ketones, UA: NEGATIVE
Leukocytes,UA: NEGATIVE
Nitrite, UA: NEGATIVE
Protein,UA: NEGATIVE
Specific Gravity, UA: 1.01 (ref 1.005–1.030)
Urobilinogen, Ur: 0.2 mg/dL (ref 0.2–1.0)
pH, UA: 6.5 (ref 5.0–7.5)

## 2023-03-22 LAB — MICROSCOPIC EXAMINATION: Epithelial Cells (non renal): >10 /HPF — AB (ref 0–10)

## 2023-03-22 LAB — BLADDER SCAN AMB NON-IMAGING: Scan Result: 19

## 2023-03-22 NOTE — Progress Notes (Signed)
 03/22/2023 5:16 PM   Amanda Mcgee [birth year] [id number]  CC: Chief Complaint  Patient presents with   Urinary Incontinence   Recurrent UTI   HPI: Amanda Mcgee is a 68 y.o. female with PMH refractory OAB wet with mixed incontinence who underwent intravesical Botox with Dr. Sherron Monday on 03/08/2023 who presents today for follow-up.   Today she reports some improvement following Botox.  She is going through fewer pads daily and is optimistic about therapy.  In-office UA today positive for 1+ blood; urine microscopy with 3-10 RBCs/HPF, >10 epithelial cells/hpf, and many bacteria. PVR 19mL.  PMH: Past Medical History:  Diagnosis Date   Actinic keratosis    Anxiety    Arthritis    Depression    HTN (hypertension)    Hyperlipidemia    Kidney stones 2013    Surgical History: Past Surgical History:  Procedure Laterality Date   ABDOMINAL HYSTERECTOMY  1990   APPENDECTOMY  1969   BLADDER SURGERY  2011   BREAST BIOPSY Left    benign   COLONOSCOPY WITH PROPOFOL N/A 02/01/2018   Procedure: COLONOSCOPY WITH PROPOFOL;  Surgeon: Wyline Mood, MD;  Location: [facility] ENDOSCOPY;  Service: Gastroenterology;  Laterality: N/A;   LITHOTRIPSY  06/2011    Home Medications:  Allergies as of 03/22/2023       Reactions   Bactrim [sulfamethoxazole-trimethoprim]    Heart palpitations    Benadryl [diphenhydramine Hcl] Swelling   Biaxin [clarithromycin]    Ciprofloxacin    Codeine Swelling   Hydrochlorothiazide    Macrobid [nitrofurantoin] Nausea And Vomiting   Penicillins    Upset stomach   Prednisone    Aspirin Nausea Only        Medication List        Accurate as of March 22, 2023  5:16 PM. If you have any questions, ask your nurse or doctor.          STOP taking these medications    ciprofloxacin 500 MG tablet Commonly known as: CIPRO       TAKE these medications    albuterol 108 (90 Base) MCG/ACT inhaler Commonly known as: VENTOLIN HFA Inhale 1-2 puffs into  the lungs every 4 (four) hours as needed for wheezing or shortness of breath.   ALPRAZolam 0.5 MG tablet Commonly known as: XANAX TAKE ONE TAB IN MORNING for anxiety AND ONE AND HALF AT NIGHT FOR anxiety/sleep   amLODipine 5 MG tablet Commonly known as: NORVASC Take 1 tablet (5 mg total) by mouth daily.   buPROPion 200 MG 12 hr tablet Commonly known as: WELLBUTRIN SR Take 1 tablet (200 mg total) by mouth 2 (two) times daily.   calcium carbonate 600 MG Tabs tablet Commonly known as: OS-CAL Take 600 mg by mouth 2 (two) times daily with a meal.   cephALEXin 250 MG capsule Commonly known as: KEFLEX Take 1 capsule (250 mg total) by mouth daily.   CRANBERRY PO Take 1 tablet by mouth 2 (two) times daily.   desvenlafaxine 100 MG 24 hr tablet Commonly known as: PRISTIQ Take 1 tablet (100 mg total) by mouth daily.   docusate sodium 100 MG capsule Commonly known as: COLACE Take 100 mg by mouth 2 (two) times daily. prn   fluticasone 50 MCG/ACT nasal spray Commonly known as: FLONASE Place 1 spray into both nostrils daily.   Gemtesa 75 MG Tabs Generic drug: Vibegron Take 1 tablet (75 mg total) by mouth daily.   levocetirizine 5 MG tablet Commonly known as:  XYZAL Take 1 tablet (5 mg total) by mouth daily.   lisinopril 10 MG tablet Commonly known as: ZESTRIL Take 1 tablet (10 mg total) by mouth daily.   meclizine 25 MG tablet Commonly known as: ANTIVERT Take twice daily as needed for dizziness   meloxicam 15 MG tablet Commonly known as: MOBIC Take 15 mg by mouth daily.   methocarbamol 500 MG tablet Commonly known as: ROBAXIN Take 500 mg by mouth 3 (three) times daily.   mupirocin ointment 2 % Commonly known as: BACTROBAN Apply 1 Application topically daily. To affected area until resolved.   Vitamin D3 10 MCG (400 UNIT) Caps Take by mouth daily.        Allergies:  Allergies  Allergen Reactions   Bactrim [Sulfamethoxazole-Trimethoprim]     Heart palpitations     Benadryl [Diphenhydramine Hcl] Swelling   Biaxin [Clarithromycin]    Ciprofloxacin    Codeine Swelling   Hydrochlorothiazide    Macrobid [Nitrofurantoin] Nausea And Vomiting   Penicillins     Upset stomach    Prednisone    Aspirin Nausea Only    Family History: Family History  Problem Relation Age of Onset   Breast cancer Mother 62   Heart attack Son     Social History:   reports that she has been smoking cigarettes. She has a 15 pack-year smoking history. She has been exposed to tobacco smoke. She has never used smokeless tobacco. She reports current alcohol use. She reports that she does not use drugs.  Physical Exam: BP (!) 149/86   Pulse 87   Ht 5\' 5"  (1.651 m)   Wt 165 lb (74.8 kg)   BMI 27.46 kg/m   Constitutional:  Alert and oriented, no acute distress, nontoxic appearing HEENT: [place], AT Cardiovascular: No clubbing, cyanosis, or edema Respiratory: Normal respiratory effort, no increased work of breathing Skin: No rashes, bruises or suspicious lesions Neurologic: Grossly intact, no focal deficits, moving all 4 extremities Psychiatric: Normal mood and affect  Laboratory Data: Results for orders placed or performed in visit on 03/22/23  Microscopic Examination   Collection Time: 03/22/23 11:02 AM   Urine  Result Value Ref Range   WBC, UA 0-5 0 - 5 /hpf   RBC, Urine 3-10 (A) 0 - 2 /hpf   Epithelial Cells (non renal) >10 (A) 0 - 10 /hpf   Bacteria, UA Many (A) None seen/Few  Urinalysis, Complete   Collection Time: 03/22/23 11:02 AM  Result Value Ref Range   Specific Gravity, UA 1.010 1.005 - 1.030   pH, UA 6.5 5.0 - 7.5   Color, UA Yellow Yellow   Appearance Ur Clear Clear   Leukocytes,UA Negative Negative   Protein,UA Negative Negative/Trace   Glucose, UA Negative Negative   Ketones, UA Negative Negative   RBC, UA 1+ (A) Negative   Bilirubin, UA Negative Negative   Urobilinogen, Ur 0.2 0.2 - 1.0 mg/dL   Nitrite, UA Negative Negative   Microscopic  Examination See below:   Bladder Scan (Post Void Residual) in office   Collection Time: 03/22/23 11:09 AM  Result Value Ref Range   Scan Result 19 ml    Assessment & Plan:   1. Mixed incontinence (Primary) Improving somewhat since undergoing intravesical Botox.  Low suspicion for UTI based on UA today and she is emptying appropriately.  Will schedule her for follow-up with Dr. Sherron Monday in about 5 months to reassess symptoms and plan for next treatment. - Urinalysis, Complete - Bladder Scan (Post Void  Residual) in office - Microscopic Examination  Return in about 5 months (around 08/22/2023) for Botox sx recheck.  Carman Ching, PA-C  [facility] [place] [address] [place], [place] [address] [phone]

## 2023-03-29 ENCOUNTER — Ambulatory Visit
Admission: RE | Admit: 2023-03-29 | Discharge: 2023-03-29 | Disposition: A | Discharge status: Home / Self Care | Payer: Medicare Other | Source: Ambulatory Visit | Attending: Nurse Practitioner | Admitting: Nurse Practitioner

## 2023-03-29 DIAGNOSIS — E2839 Other primary ovarian failure: Secondary | ICD-10-CM | POA: Diagnosis present

## 2023-03-29 DIAGNOSIS — Z1231 Encounter for screening mammogram for malignant neoplasm of breast: Secondary | ICD-10-CM | POA: Insufficient documentation

## 2023-04-05 ENCOUNTER — Encounter: Payer: Self-pay | Admitting: Nurse Practitioner

## 2023-04-05 ENCOUNTER — Ambulatory Visit: Payer: Medicare Other | Admitting: Nurse Practitioner

## 2023-04-05 VITALS — BP 132/70 | HR 80 | Temp 98.1°F | Resp 16 | Ht 65.0 in | Wt 168.4 lb

## 2023-04-05 DIAGNOSIS — E782 Mixed hyperlipidemia: Secondary | ICD-10-CM | POA: Diagnosis not present

## 2023-04-05 DIAGNOSIS — J4520 Mild intermittent asthma, uncomplicated: Secondary | ICD-10-CM | POA: Diagnosis not present

## 2023-04-05 DIAGNOSIS — I10 Essential (primary) hypertension: Secondary | ICD-10-CM

## 2023-04-05 DIAGNOSIS — F3342 Major depressive disorder, recurrent, in full remission: Secondary | ICD-10-CM

## 2023-04-05 DIAGNOSIS — M8589 Other specified disorders of bone density and structure, multiple sites: Secondary | ICD-10-CM

## 2023-04-05 DIAGNOSIS — J3089 Other allergic rhinitis: Secondary | ICD-10-CM

## 2023-04-05 DIAGNOSIS — F411 Generalized anxiety disorder: Secondary | ICD-10-CM

## 2023-04-05 MED ORDER — AMLODIPINE BESYLATE 5 MG PO TABS: 5.0000 mg | ORAL_TABLET | Freq: Every day | ORAL | 3 refills | Status: AC

## 2023-04-05 MED ORDER — ALBUTEROL SULFATE HFA 108 (90 BASE) MCG/ACT IN AERS: 1.0000 | INHALATION_SPRAY | RESPIRATORY_TRACT | 1 refills | Status: DC | PRN

## 2023-04-05 MED ORDER — LEVOCETIRIZINE DIHYDROCHLORIDE 5 MG PO TABS: 5.0000 mg | ORAL_TABLET | Freq: Every day | ORAL | 1 refills | Status: DC

## 2023-04-05 MED ORDER — LISINOPRIL 10 MG PO TABS: 10.0000 mg | ORAL_TABLET | Freq: Every day | ORAL | 1 refills | Status: DC

## 2023-04-05 MED ORDER — DESVENLAFAXINE SUCCINATE ER 100 MG PO TB24: 100.0000 mg | ORAL_TABLET | Freq: Every day | ORAL | 1 refills | Status: DC

## 2023-04-05 MED ORDER — BUPROPION HCL ER (SR) 200 MG PO TB12
200.0000 mg | ORAL_TABLET | Freq: Two times a day (BID) | ORAL | 1 refills | Status: AC
Start: 2023-04-05 — End: ?

## 2023-04-05 MED ORDER — ALPRAZOLAM 0.5 MG PO TABS
ORAL_TABLET | ORAL | 3 refills | Status: AC
Start: 2023-04-05 — End: ?

## 2023-04-05 MED ORDER — LOVASTATIN 20 MG PO TABS: 20.0000 mg | ORAL_TABLET | Freq: Every day | ORAL | 1 refills | Status: DC

## 2023-04-05 NOTE — Progress Notes (Signed)
 [facility] [address] [place], [place] [address]  Internal MEDICINE  Office Visit Note  Patient Name: Amanda Mcgee  [id number]  [id number]  Date of Service: 04/05/2023  Chief Complaint  Patient presents with   Depression   Hypertension   Hyperlipidemia   Follow-up    HPI Amanda Mcgee presents for a follow-up visit for dexa scan results, hypertension, lab results, anxiety, and ASCVD.  Osteopenia -- on recent BMD scan -- of spine and femur neck, normal total femur and left forearm.  Mammogram was negative Anxiety -- takes alprazolam as needed.  Hypertension -- controlled with amlodipine and lisinopril Urinary incontinence -- had botox treatment from urology Prediabetes -- A1c is 6.2  Significantly elevated LDL at 191, did not tolerate rosuvastatin previously due to sore muscles and joints.   The 10-year ASCVD risk score (Arnett DK, et al., 2019) is: 20.5%   Values used to calculate the score:     Age: 68 years     Sex: Female     Is Non-Hispanic African American: No     Diabetic: No     Tobacco smoker: Yes     Systolic Blood Pressure: 132 mmHg     Is BP treated: Yes     HDL Cholesterol: 48 mg/dL     Total Cholesterol: 260 mg/dL   Current Medication: Outpatient Encounter Medications as of 04/05/2023  Medication Sig   calcium carbonate (OS-CAL) 600 MG TABS tablet Take 600 mg by mouth 2 (two) times daily with a meal.   cephALEXin (KEFLEX) 250 MG capsule Take 1 capsule (250 mg total) by mouth daily.   Cholecalciferol (VITAMIN D3) 400 units CAPS Take by mouth daily.   CRANBERRY PO Take 1 tablet by mouth 2 (two) times daily.   docusate sodium (COLACE) 100 MG capsule Take 100 mg by mouth 2 (two) times daily. prn   fluticasone (FLONASE) 50 MCG/ACT nasal spray Place 1 spray into both nostrils daily.   lovastatin (MEVACOR) 20 MG tablet Take 1 tablet (20 mg total) by mouth at bedtime.   meclizine (ANTIVERT) 25 MG tablet Take twice daily as needed for dizziness    meloxicam (MOBIC) 15 MG tablet Take 15 mg by mouth daily.   methocarbamol (ROBAXIN) 500 MG tablet Take 500 mg by mouth 3 (three) times daily.   mupirocin ointment (BACTROBAN) 2 % Apply 1 Application topically daily. To affected area until resolved.   Vibegron (GEMTESA) 75 MG TABS Take 1 tablet (75 mg total) by mouth daily.   [DISCONTINUED] albuterol (VENTOLIN HFA) 108 (90 Base) MCG/ACT inhaler Inhale 1-2 puffs into the lungs every 4 (four) hours as needed for wheezing or shortness of breath.   [DISCONTINUED] ALPRAZolam (XANAX) 0.5 MG tablet TAKE ONE TAB IN MORNING for anxiety AND ONE AND HALF AT NIGHT FOR anxiety/sleep   [DISCONTINUED] amLODipine (NORVASC) 5 MG tablet Take 1 tablet (5 mg total) by mouth daily.   [DISCONTINUED] buPROPion (WELLBUTRIN SR) 200 MG 12 hr tablet Take 1 tablet (200 mg total) by mouth 2 (two) times daily.   [DISCONTINUED] desvenlafaxine (PRISTIQ) 100 MG 24 hr tablet Take 1 tablet (100 mg total) by mouth daily.   [DISCONTINUED] levocetirizine (XYZAL) 5 MG tablet Take 1 tablet (5 mg total) by mouth daily.   [DISCONTINUED] lisinopril (ZESTRIL) 10 MG tablet Take 1 tablet (10 mg total) by mouth daily.   albuterol (VENTOLIN HFA) 108 (90 Base) MCG/ACT inhaler Inhale 1-2 puffs into the lungs every 4 (four) hours as needed for wheezing or  shortness of breath.   ALPRAZolam (XANAX) 0.5 MG tablet TAKE ONE TAB IN MORNING for anxiety AND ONE AND HALF AT NIGHT FOR anxiety/sleep   amLODipine (NORVASC) 5 MG tablet Take 1 tablet (5 mg total) by mouth daily.   buPROPion (WELLBUTRIN SR) 200 MG 12 hr tablet Take 1 tablet (200 mg total) by mouth 2 (two) times daily.   desvenlafaxine (PRISTIQ) 100 MG 24 hr tablet Take 1 tablet (100 mg total) by mouth daily.   levocetirizine (XYZAL) 5 MG tablet Take 1 tablet (5 mg total) by mouth daily.   lisinopril (ZESTRIL) 10 MG tablet Take 1 tablet (10 mg total) by mouth daily.   No facility-administered encounter medications on file as of 04/05/2023.     Surgical History: Past Surgical History:  Procedure Laterality Date   ABDOMINAL HYSTERECTOMY  1990   APPENDECTOMY  1969   BLADDER SURGERY  2011   BREAST BIOPSY Left    benign   COLONOSCOPY WITH PROPOFOL N/A 02/01/2018   Procedure: COLONOSCOPY WITH PROPOFOL;  Surgeon: Wyline Mood, MD;  Location: [facility] ENDOSCOPY;  Service: Gastroenterology;  Laterality: N/A;   LITHOTRIPSY  06/2011    Medical History: Past Medical History:  Diagnosis Date   Actinic keratosis    Anxiety    Arthritis    Depression    HTN (hypertension)    Hyperlipidemia    Kidney stones 2013    Family History: Family History  Problem Relation Age of Onset   Breast cancer Mother 33   Heart attack Son     Social History   Socioeconomic History   Marital status: Married    Spouse name: Not on file   Number of children: Not on file   Years of education: Not on file   Highest education level: Not on file  Occupational History   Not on file  Tobacco Use   Smoking status: Every Day    Current packs/day: 0.50    Average packs/day: 0.5 packs/day for 30.0 years (15.0 ttl pk-yrs)    Types: Cigarettes    Passive exposure: Current   Smokeless tobacco: Never   Tobacco comments:    1/2 pack daily  Vaping Use   Vaping status: Never Used  Substance and Sexual Activity   Alcohol use: Yes    Comment: occassionally   Drug use: No   Sexual activity: Never  Other Topics Concern   Not on file  Social History Narrative   Not on file   Social Drivers of Health   Financial Resource Strain: Not on file  Food Insecurity: Not on file  Transportation Needs: Not on file  Physical Activity: Not on file  Stress: Not on file  Social Connections: Not on file  Intimate Partner Violence: Not on file      Review of Systems  Constitutional:  Negative for chills, fatigue and unexpected weight change.  HENT:  Negative for congestion, rhinorrhea, sneezing and sore throat.   Eyes:  Negative for redness.   Respiratory:  Negative for cough, chest tightness and shortness of breath.   Cardiovascular:  Negative for chest pain and palpitations.  Gastrointestinal:  Negative for abdominal pain, constipation, diarrhea, nausea and vomiting.  Genitourinary:  Negative for dysuria and frequency.  Musculoskeletal:  Negative for arthralgias, back pain, joint swelling and neck pain.  Skin:  Negative for rash.  Neurological: Negative.  Negative for tremors and numbness.  Hematological:  Negative for adenopathy. Does not bruise/bleed easily.  Psychiatric/Behavioral:  Negative for behavioral problems (Depression), self-injury,  sleep disturbance and suicidal ideas. The patient is nervous/anxious.     Vital Signs: BP 132/70 Comment: 150/86  Pulse 80   Temp 98.1 F (36.7 C)   Resp 16   Ht 5\' 5"  (1.651 m)   Wt 168 lb 6.4 oz (76.4 kg)   SpO2 97%   BMI 28.02 kg/m    Physical Exam Vitals reviewed.  Constitutional:      General: She is not in acute distress.    Appearance: Normal appearance. She is obese. She is not ill-appearing.  HENT:     Head: Normocephalic and atraumatic.     Right Ear: There is no impacted cerumen.     Left Ear: There is no impacted cerumen.  Eyes:     Pupils: Pupils are equal, round, and reactive to light.  Cardiovascular:     Rate and Rhythm: Normal rate and regular rhythm.  Pulmonary:     Effort: Pulmonary effort is normal. No respiratory distress.  Neurological:     Mental Status: She is alert and oriented to person, place, and time.  Psychiatric:        Mood and Affect: Mood normal.        Behavior: Behavior normal.       Assessment/Plan: 1. Essential hypertension (Primary) Stable, continue amlodipine and lisinopril as prescribed.  - amLODipine (NORVASC) 5 MG tablet; Take 1 tablet (5 mg total) by mouth daily.  Dispense: 90 tablet; Refill: 3 - lisinopril (ZESTRIL) 10 MG tablet; Take 1 tablet (10 mg total) by mouth daily.  Dispense: 90 tablet; Refill: 1  2.  Mixed hyperlipidemia Will try a medium intensity statin such as lovastatin, take as prescribed.  - lovastatin (MEVACOR) 20 MG tablet; Take 1 tablet (20 mg total) by mouth at bedtime.  Dispense: 90 tablet; Refill: 1  3. Osteopenia of multiple sites Continue calcium supplement daily and weight bearing exercises as tolerated.   4. Mild intermittent asthma without complication Continue prn albuterol as prescribed.  - albuterol (VENTOLIN HFA) 108 (90 Base) MCG/ACT inhaler; Inhale 1-2 puffs into the lungs every 4 (four) hours as needed for wheezing or shortness of breath.  Dispense: 18 each; Refill: 1  5. Non-seasonal allergic rhinitis due to other allergic trigger Continue levocetirizine as prescribed.  - levocetirizine (XYZAL) 5 MG tablet; Take 1 tablet (5 mg total) by mouth daily.  Dispense: 90 tablet; Refill: 1  6. Generalized anxiety disorder Continue desvenlafaxine and prn alprazolam as prescribed. Follow up in 3 months for additional  - ALPRAZolam (XANAX) 0.5 MG tablet; TAKE ONE TAB IN MORNING for anxiety AND ONE AND HALF AT NIGHT FOR anxiety/sleep  Dispense: 75 tablet; Refill: 3 - desvenlafaxine (PRISTIQ) 100 MG 24 hr tablet; Take 1 tablet (100 mg total) by mouth daily.  Dispense: 90 tablet; Refill: 1  7. Recurrent major depressive disorder, in full remission (HCC) Continue bupropion and desvenlafaxine as prescribed.  - desvenlafaxine (PRISTIQ) 100 MG 24 hr tablet; Take 1 tablet (100 mg total) by mouth daily.  Dispense: 90 tablet; Refill: 1 - buPROPion (WELLBUTRIN SR) 200 MG 12 hr tablet; Take 1 tablet (200 mg total) by mouth 2 (two) times daily.  Dispense: 180 tablet; Refill: 1   General Counseling: Dashia verbalizes understanding of the findings of todays visit and agrees with plan of treatment. I have discussed any further diagnostic evaluation that may be needed or ordered today. We also reviewed her medications today. she has been encouraged to call the office with any questions or  concerns that  should arise related to todays visit.    No orders of the defined types were placed in this encounter.   Meds ordered this encounter  Medications   ALPRAZolam (XANAX) 0.5 MG tablet    Sig: TAKE ONE TAB IN MORNING for anxiety AND ONE AND HALF AT NIGHT FOR anxiety/sleep    Dispense:  75 tablet    Refill:  3    For future refills, keep on file   desvenlafaxine (PRISTIQ) 100 MG 24 hr tablet    Sig: Take 1 tablet (100 mg total) by mouth daily.    Dispense:  90 tablet    Refill:  1    Ok to fill as 90 day prescription, for future refills   buPROPion (WELLBUTRIN SR) 200 MG 12 hr tablet    Sig: Take 1 tablet (200 mg total) by mouth 2 (two) times daily.    Dispense:  180 tablet    Refill:  1    For future refill   amLODipine (NORVASC) 5 MG tablet    Sig: Take 1 tablet (5 mg total) by mouth daily.    Dispense:  90 tablet    Refill:  3   albuterol (VENTOLIN HFA) 108 (90 Base) MCG/ACT inhaler    Sig: Inhale 1-2 puffs into the lungs every 4 (four) hours as needed for wheezing or shortness of breath.    Dispense:  18 each    Refill:  1    Patient will call pharmacy if she needs refill, do not fill until she request it.   levocetirizine (XYZAL) 5 MG tablet    Sig: Take 1 tablet (5 mg total) by mouth daily.    Dispense:  90 tablet    Refill:  1    For future refill   lisinopril (ZESTRIL) 10 MG tablet    Sig: Take 1 tablet (10 mg total) by mouth daily.    Dispense:  90 tablet    Refill:  1    For future refills   lovastatin (MEVACOR) 20 MG tablet    Sig: Take 1 tablet (20 mg total) by mouth at bedtime.    Dispense:  90 tablet    Refill:  1    Fill new script today    Return in about 3 months (around 06/30/2023) for F/U, anxiety med refill, Kaylob Wallen PCP, UDS due next visit. .   Total time spent:30 Minutes Time spent includes review of chart, medications, test results, and follow up plan with the patient.   [place] Controlled Substance Database was reviewed by me.  This  patient was seen by Sallyanne Kuster, FNP-C in collaboration with Dr. Beverely Risen as a part of collaborative care agreement.   Mable Dara R. Tedd Sias, MSN, FNP-C Internal medicine

## 2023-04-18 ENCOUNTER — Encounter: Payer: Self-pay | Admitting: Nurse Practitioner

## 2023-05-20 ENCOUNTER — Ambulatory Visit: Payer: Medicare Other | Admitting: Dermatology

## 2023-05-31 ENCOUNTER — Telehealth: Payer: Self-pay | Admitting: Urology

## 2023-05-31 DIAGNOSIS — N3946 Mixed incontinence: Secondary | ICD-10-CM

## 2023-05-31 MED ORDER — MIRABEGRON ER 50 MG PO TB24: 50.0000 mg | ORAL_TABLET | Freq: Every day | ORAL | 11 refills | Status: DC

## 2023-05-31 NOTE — Telephone Encounter (Signed)
 Patient called and stated that she went to pick up her Gemtesa  from Pharmacy and was being charged $259 for a 30 day supply. Patient was not able to pick up prescription because she was not ale to afford it. Patient states that she did like how the Gemtesa  works and would like to know if she can be prescribed something similar and more affordable. Please advise patient.

## 2023-05-31 NOTE — Telephone Encounter (Signed)
 I'm sending in mirabegron  50mg  daily as an alternative. She previously failed oxybutynin .

## 2023-06-01 NOTE — Telephone Encounter (Signed)
 Pt states that mirabegron  is still too expensive. Pt asked for gemtesa  samples while we try to figure something out

## 2023-06-04 MED ORDER — GEMTESA 75 MG PO TABS: 75.0000 mg | ORAL_TABLET | Freq: Every day | ORAL | Status: DC

## 2023-06-04 NOTE — Telephone Encounter (Signed)
 Okay to offer Gemtesa  samples.  Let's get her scheduled for follow-up with Dr. MacDiarmid sooner than scheduled, since Botox  alone is not getting her to treatment goal.

## 2023-06-04 NOTE — Telephone Encounter (Signed)
 Pt informed of gemtesa  samples. Pt states she will be in later today to pick them up. Pt's current appt is the soonest available.

## 2023-06-28 ENCOUNTER — Ambulatory Visit (INDEPENDENT_AMBULATORY_CARE_PROVIDER_SITE_OTHER): Admitting: Nurse Practitioner

## 2023-06-28 ENCOUNTER — Encounter: Payer: Self-pay | Admitting: Nurse Practitioner

## 2023-06-28 VITALS — BP 138/70 | HR 99 | Temp 96.9°F | Resp 16 | Ht 65.0 in | Wt 163.6 lb

## 2023-06-28 DIAGNOSIS — J3089 Other allergic rhinitis: Secondary | ICD-10-CM

## 2023-06-28 DIAGNOSIS — F411 Generalized anxiety disorder: Secondary | ICD-10-CM

## 2023-06-28 DIAGNOSIS — F3342 Major depressive disorder, recurrent, in full remission: Secondary | ICD-10-CM

## 2023-06-28 DIAGNOSIS — Z79899 Other long term (current) drug therapy: Secondary | ICD-10-CM | POA: Diagnosis not present

## 2023-06-28 DIAGNOSIS — E782 Mixed hyperlipidemia: Secondary | ICD-10-CM

## 2023-06-28 DIAGNOSIS — I10 Essential (primary) hypertension: Secondary | ICD-10-CM

## 2023-06-28 LAB — POCT URINE DRUG SCREEN
Methylenedioxyamphetamine: NOT DETECTED
POC Amphetamine UR: NOT DETECTED
POC BENZODIAZEPINES UR: POSITIVE — AB
POC Barbiturate UR: NOT DETECTED
POC Cocaine UR: NOT DETECTED
POC Ecstasy UR: NOT DETECTED
POC Marijuana UR: NOT DETECTED
POC Methadone UR: NOT DETECTED
POC Methamphetamine UR: NOT DETECTED
POC Opiate Ur: NOT DETECTED
POC Oxycodone UR: NOT DETECTED
POC PHENCYCLIDINE UR: NOT DETECTED
POC TRICYCLICS UR: NOT DETECTED

## 2023-06-28 MED ORDER — BUPROPION HCL ER (SR) 200 MG PO TB12: 200.0000 mg | ORAL_TABLET | Freq: Two times a day (BID) | ORAL | 1 refills | Status: DC

## 2023-06-28 MED ORDER — ALPRAZOLAM 0.5 MG PO TABS: ORAL_TABLET | ORAL | 3 refills | Status: DC

## 2023-06-28 MED ORDER — LOVASTATIN 20 MG PO TABS
20.0000 mg | ORAL_TABLET | Freq: Every day | ORAL | 1 refills | Status: DC
Start: 2023-06-28 — End: 2023-09-23

## 2023-06-28 MED ORDER — LEVOCETIRIZINE DIHYDROCHLORIDE 5 MG PO TABS: 5.0000 mg | ORAL_TABLET | Freq: Every day | ORAL | 1 refills | Status: DC

## 2023-06-28 MED ORDER — LISINOPRIL 10 MG PO TABS: 10.0000 mg | ORAL_TABLET | Freq: Every day | ORAL | 1 refills | Status: DC

## 2023-06-28 MED ORDER — DESVENLAFAXINE SUCCINATE ER 100 MG PO TB24
100.0000 mg | ORAL_TABLET | Freq: Every day | ORAL | 1 refills | Status: DC
Start: 2023-06-28 — End: 2023-09-23

## 2023-06-28 NOTE — Progress Notes (Signed)
 [facility] [address] [place], [place] [address]  Internal MEDICINE  Office Visit Note  Patient Name: Amanda Mcgee  [id number]  [id number]  Date of Service: 06/28/2023  Chief Complaint  Patient presents with   Depression   Hypertension   Hyperlipidemia   Follow-up    HPI Amanda Mcgee presents for a follow-up visit for left knee pain, osteopenia, anxiety and depression.  Torn meniscus of the left knee -- went to [facility], may use physical therapy. No surgery needed at this time. Also has a baker's cyst behind the knee.  Osteopenia of spine and femur neck -- reviewed dexa scan results with patient.  Anxiety -- taking alprazolam  as needed.  A little depressed because she is not able to do a lot physically right now due to the knee injury.     Current Medication: Outpatient Encounter Medications as of 06/28/2023  Medication Sig   albuterol  (VENTOLIN  HFA) 108 (90 Base) MCG/ACT inhaler Inhale 1-2 puffs into the lungs every 4 (four) hours as needed for wheezing or shortness of breath.   ALPRAZolam  (XANAX ) 0.5 MG tablet TAKE ONE TAB IN MORNING for anxiety AND ONE AND HALF AT NIGHT FOR anxiety/sleep   amLODipine  (NORVASC ) 5 MG tablet Take 1 tablet (5 mg total) by mouth daily.   buPROPion  (WELLBUTRIN  SR) 200 MG 12 hr tablet Take 1 tablet (200 mg total) by mouth 2 (two) times daily.   calcium  carbonate (OS-CAL) 600 MG TABS tablet Take 600 mg by mouth 2 (two) times daily with a meal.   cephALEXin  (KEFLEX ) 250 MG capsule Take 1 capsule (250 mg total) by mouth daily.   Cholecalciferol (VITAMIN D3) 400 units CAPS Take by mouth daily.   CRANBERRY PO Take 1 tablet by mouth 2 (two) times daily.   desvenlafaxine  (PRISTIQ ) 100 MG 24 hr tablet Take 1 tablet (100 mg total) by mouth daily.   docusate sodium  (COLACE) 100 MG capsule Take 100 mg by mouth 2 (two) times daily. prn   fluticasone  (FLONASE ) 50 MCG/ACT nasal spray Place 1 spray into both nostrils daily.   levocetirizine  (XYZAL ) 5 MG tablet Take 1 tablet (5 mg total) by mouth daily.   lisinopril  (ZESTRIL ) 10 MG tablet Take 1 tablet (10 mg total) by mouth daily.   lovastatin  (MEVACOR ) 20 MG tablet Take 1 tablet (20 mg total) by mouth at bedtime.   meclizine  (ANTIVERT ) 25 MG tablet Take twice daily as needed for dizziness   meloxicam (MOBIC) 15 MG tablet Take 15 mg by mouth daily.   methocarbamol (ROBAXIN) 500 MG tablet Take 500 mg by mouth 3 (three) times daily.   mirabegron  ER (MYRBETRIQ ) 50 MG TB24 tablet Take 1 tablet (50 mg total) by mouth daily.   mupirocin  ointment (BACTROBAN ) 2 % Apply 1 Application topically daily. To affected area until resolved.   Vibegron  (GEMTESA ) 75 MG TABS Take 1 tablet (75 mg total) by mouth daily.   [DISCONTINUED] ALPRAZolam  (XANAX ) 0.5 MG tablet TAKE ONE TAB IN MORNING for anxiety AND ONE AND HALF AT NIGHT FOR anxiety/sleep   [DISCONTINUED] buPROPion  (WELLBUTRIN  SR) 200 MG 12 hr tablet Take 1 tablet (200 mg total) by mouth 2 (two) times daily.   [DISCONTINUED] desvenlafaxine  (PRISTIQ ) 100 MG 24 hr tablet Take 1 tablet (100 mg total) by mouth daily.   [DISCONTINUED] levocetirizine (XYZAL ) 5 MG tablet Take 1 tablet (5 mg total) by mouth daily.   [DISCONTINUED] lisinopril  (ZESTRIL ) 10 MG tablet Take 1 tablet (10 mg total) by mouth daily.   [  DISCONTINUED] lovastatin  (MEVACOR ) 20 MG tablet Take 1 tablet (20 mg total) by mouth at bedtime.   No facility-administered encounter medications on file as of 06/28/2023.    Surgical History: Past Surgical History:  Procedure Laterality Date   ABDOMINAL HYSTERECTOMY  1990   APPENDECTOMY  1969   BLADDER SURGERY  2011   BREAST BIOPSY Left    benign   COLONOSCOPY WITH PROPOFOL  N/A 02/01/2018   Procedure: COLONOSCOPY WITH PROPOFOL ;  Surgeon: Luke Salaam, MD;  Location: [facility] ENDOSCOPY;  Service: Gastroenterology;  Laterality: N/A;   LITHOTRIPSY  06/2011    Medical History: Past Medical History:  Diagnosis Date   Actinic keratosis     Anxiety    Arthritis    Depression    HTN (hypertension)    Hyperlipidemia    Kidney stones 2013    Family History: Family History  Problem Relation Age of Onset   Breast cancer Mother 6   Heart attack Son     Social History   Socioeconomic History   Marital status: Married    Spouse name: Not on file   Number of children: Not on file   Years of education: Not on file   Highest education level: Not on file  Occupational History   Not on file  Tobacco Use   Smoking status: Every Day    Current packs/day: 0.50    Average packs/day: 0.5 packs/day for 30.0 years (15.0 ttl pk-yrs)    Types: Cigarettes    Passive exposure: Current   Smokeless tobacco: Never   Tobacco comments:    1/2 pack daily  Vaping Use   Vaping status: Never Used  Substance and Sexual Activity   Alcohol use: Yes    Comment: occassionally   Drug use: No   Sexual activity: Never  Other Topics Concern   Not on file  Social History Narrative   Not on file   Social Drivers of Health   Financial Resource Strain: Not on file  Food Insecurity: Not on file  Transportation Needs: Not on file  Physical Activity: Not on file  Stress: Not on file  Social Connections: Not on file  Intimate Partner Violence: Not on file      Review of Systems  Constitutional:  Negative for chills, fatigue and unexpected weight change.  HENT:  Negative for congestion, rhinorrhea, sneezing and sore throat.   Eyes:  Negative for redness.  Respiratory: Negative.  Negative for cough, chest tightness, shortness of breath and wheezing.   Cardiovascular: Negative.  Negative for chest pain and palpitations.  Gastrointestinal:  Negative for abdominal pain, constipation, diarrhea, nausea and vomiting.  Genitourinary:  Negative for dysuria and frequency.  Musculoskeletal: Negative.  Negative for arthralgias, back pain, joint swelling and neck pain.  Skin:  Negative for rash.  Neurological: Negative.  Negative for tremors and  numbness.  Hematological:  Negative for adenopathy. Does not bruise/bleed easily.  Psychiatric/Behavioral:  Negative for behavioral problems (Depression), self-injury, sleep disturbance and suicidal ideas. The patient is nervous/anxious.     Vital Signs: BP 138/70   Pulse 99   Temp (!) 96.9 F (36.1 C)   Resp 16   Ht 5' 5 (1.651 m)   Wt 163 lb 9.6 oz (74.2 kg)   SpO2 97%   BMI 27.22 kg/m    Physical Exam Vitals reviewed.  Constitutional:      General: She is not in acute distress.    Appearance: Normal appearance. She is not ill-appearing.  HENT:  Head: Normocephalic and atraumatic.   Eyes:     Pupils: Pupils are equal, round, and reactive to light.    Cardiovascular:     Rate and Rhythm: Normal rate and regular rhythm.  Pulmonary:     Effort: Pulmonary effort is normal. No respiratory distress.   Skin:    Capillary Refill: Capillary refill takes less than 2 seconds.   Neurological:     Mental Status: She is alert and oriented to person, place, and time.   Psychiatric:        Mood and Affect: Mood normal.        Behavior: Behavior normal.        Assessment/Plan: 1. Essential hypertension (Primary) Stable, continue lisinopril  as prescribed.  - lisinopril  (ZESTRIL ) 10 MG tablet; Take 1 tablet (10 mg total) by mouth daily.  Dispense: 90 tablet; Refill: 1  2. Mixed hyperlipidemia Continue lovastatin  as prescribed.  - lovastatin  (MEVACOR ) 20 MG tablet; Take 1 tablet (20 mg total) by mouth at bedtime.  Dispense: 90 tablet; Refill: 1  3. Non-seasonal allergic rhinitis due to other allergic trigger Continue levocetirizine as prescribed.  - levocetirizine (XYZAL ) 5 MG tablet; Take 1 tablet (5 mg total) by mouth daily.  Dispense: 90 tablet; Refill: 1  4. Encounter for long-term (current) drug use UDS done and result is positive for benzodiazepines which is consistent with her current prescriptions.  - POCT Urine Drug Screen  5. Generalized anxiety  disorder Continue desvenlafaxine  and prn alprazolam  as prescribed. Follow up in 3 months for additional refills of alprazolam .  - ALPRAZolam  (XANAX ) 0.5 MG tablet; TAKE ONE TAB IN MORNING for anxiety AND ONE AND HALF AT NIGHT FOR anxiety/sleep  Dispense: 75 tablet; Refill: 3 - desvenlafaxine  (PRISTIQ ) 100 MG 24 hr tablet; Take 1 tablet (100 mg total) by mouth daily.  Dispense: 90 tablet; Refill: 1  6. Recurrent major depressive disorder, in full remission (HCC) Continue desvenlafaxine  and bupropion  as prescribed  - desvenlafaxine  (PRISTIQ ) 100 MG 24 hr tablet; Take 1 tablet (100 mg total) by mouth daily.  Dispense: 90 tablet; Refill: 1 - buPROPion  (WELLBUTRIN  SR) 200 MG 12 hr tablet; Take 1 tablet (200 mg total) by mouth 2 (two) times daily.  Dispense: 180 tablet; Refill: 1   General Counseling: Hadasah verbalizes understanding of the findings of todays visit and agrees with plan of treatment. I have discussed any further diagnostic evaluation that may be needed or ordered today. We also reviewed her medications today. she has been encouraged to call the office with any questions or concerns that should arise related to todays visit.    Orders Placed This Encounter  Procedures   POCT Urine Drug Screen    Meds ordered this encounter  Medications   ALPRAZolam  (XANAX ) 0.5 MG tablet    Sig: TAKE ONE TAB IN MORNING for anxiety AND ONE AND HALF AT NIGHT FOR anxiety/sleep    Dispense:  75 tablet    Refill:  3    For future refills, keep on file   desvenlafaxine  (PRISTIQ ) 100 MG 24 hr tablet    Sig: Take 1 tablet (100 mg total) by mouth daily.    Dispense:  90 tablet    Refill:  1    Ok to fill as 90 day prescription, for future refills   levocetirizine (XYZAL ) 5 MG tablet    Sig: Take 1 tablet (5 mg total) by mouth daily.    Dispense:  90 tablet    Refill:  1  For future refill   lisinopril  (ZESTRIL ) 10 MG tablet    Sig: Take 1 tablet (10 mg total) by mouth daily.    Dispense:  90  tablet    Refill:  1    For future refills   lovastatin  (MEVACOR ) 20 MG tablet    Sig: Take 1 tablet (20 mg total) by mouth at bedtime.    Dispense:  90 tablet    Refill:  1    Fill new script today   buPROPion  (WELLBUTRIN  SR) 200 MG 12 hr tablet    Sig: Take 1 tablet (200 mg total) by mouth 2 (two) times daily.    Dispense:  180 tablet    Refill:  1    For future refill    Return in about 3 months (around 09/22/2023) for F/U, anxiety med refill, [facility] PCP.   Total time spent:30 Minutes Time spent includes review of chart, medications, test results, and follow up plan with the patient.   [place] Controlled Substance Database was reviewed by me.  This patient was seen by Laurence Pons, FNP-C in collaboration with Dr. Verneta Gone as a part of collaborative care agreement.   Sylvan Lahm R. Bobbi Burow, MSN, FNP-C Internal medicine

## 2023-07-01 ENCOUNTER — Encounter: Payer: Self-pay | Admitting: Nurse Practitioner

## 2023-08-02 ENCOUNTER — Ambulatory Visit: Admitting: Dermatology

## 2023-08-02 DIAGNOSIS — L718 Other rosacea: Secondary | ICD-10-CM

## 2023-08-02 DIAGNOSIS — D692 Other nonthrombocytopenic purpura: Secondary | ICD-10-CM

## 2023-08-02 DIAGNOSIS — W908XXA Exposure to other nonionizing radiation, initial encounter: Secondary | ICD-10-CM

## 2023-08-02 DIAGNOSIS — Z7189 Other specified counseling: Secondary | ICD-10-CM

## 2023-08-02 DIAGNOSIS — L578 Other skin changes due to chronic exposure to nonionizing radiation: Secondary | ICD-10-CM

## 2023-08-02 DIAGNOSIS — L820 Inflamed seborrheic keratosis: Secondary | ICD-10-CM

## 2023-08-02 DIAGNOSIS — L570 Actinic keratosis: Secondary | ICD-10-CM

## 2023-08-02 DIAGNOSIS — D229 Melanocytic nevi, unspecified: Secondary | ICD-10-CM

## 2023-08-02 DIAGNOSIS — Z1283 Encounter for screening for malignant neoplasm of skin: Secondary | ICD-10-CM | POA: Diagnosis not present

## 2023-08-02 DIAGNOSIS — L814 Other melanin hyperpigmentation: Secondary | ICD-10-CM

## 2023-08-02 DIAGNOSIS — L719 Rosacea, unspecified: Secondary | ICD-10-CM

## 2023-08-02 DIAGNOSIS — Z79899 Other long term (current) drug therapy: Secondary | ICD-10-CM

## 2023-08-02 DIAGNOSIS — R238 Other skin changes: Secondary | ICD-10-CM

## 2023-08-02 DIAGNOSIS — L304 Erythema intertrigo: Secondary | ICD-10-CM

## 2023-08-02 MED ORDER — KETOCONAZOLE 2 % EX CREA: TOPICAL_CREAM | CUTANEOUS | 2 refills | Status: DC

## 2023-08-02 MED ORDER — HYDROCORTISONE 2.5 % EX CREA: TOPICAL_CREAM | CUTANEOUS | 2 refills | Status: DC

## 2023-08-02 NOTE — Progress Notes (Unsigned)
 Follow-Up Visit   Subjective  Amanda Mcgee is a 68 y.o. female who presents for the following: Skin Cancer Screening and Full Body Skin Exam Hx of aks, hx fo rosacea, hx of isks and hx of intertrigo using ketoconazole  cream and hc cream as needed for rash   Reports some dark spots at right chest, and mid chest   The patient presents for Total-Body Skin Exam (TBSE) for skin cancer screening and mole check. The patient has spots, moles and lesions to be evaluated, some may be new or changing and the patient may have concern these could be cancer.  The following portions of the chart were reviewed this encounter and updated as appropriate: medications, allergies, medical history  Review of Systems:  No other skin or systemic complaints except as noted in HPI or Assessment and Plan.  Objective  Well appearing patient in no apparent distress; mood and affect are within normal limits.  A full examination was performed including scalp, head, eyes, ears, nose, lips, neck, chest, axillae, abdomen, back, buttocks, bilateral upper extremities, bilateral lower extremities, hands, feet, fingers, toes, fingernails, and toenails. All findings within normal limits unless otherwise noted below.   Relevant physical exam findings are noted in the Assessment and Plan.       left of midline of dorsum nose x 1 Erythematous thin papules/macules with gritty scale.  chest x 4 (4) Erythematous stuck-on, waxy papule or plaque  Assessment & Plan   SKIN CANCER SCREENING PERFORMED TODAY.  ACTINIC DAMAGE - Chronic condition, secondary to cumulative UV/sun exposure - diffuse scaly erythematous macules with underlying dyspigmentation - Recommend daily broad spectrum sunscreen SPF 30+ to sun-exposed areas, reapply every 2 hours as needed.  - Staying in the shade or wearing long sleeves, sun glasses (UVA+UVB protection) and wide brim hats (4-inch brim around the entire circumference of the hat) are also  recommended for sun protection.  - Call for new or changing lesions.  LENTIGINES, SEBORRHEIC KERATOSES, HEMANGIOMAS - Benign normal skin lesions - Benign-appearing - Call for any changes  MELANOCYTIC NEVI - Tan-brown and/or pink-flesh-colored symmetric macules and papules - Benign appearing on exam today - Observation - Call clinic for new or changing moles - Recommend daily use of broad spectrum spf 30+ sunscreen to sun-exposed areas.   Purpura - Chronic; persistent and recurrent.  Treatable, but not curable. - Violaceous macules and patches - Benign - Related to trauma, age, sun damage and/or use of blood thinners, chronic use of topical and/or oral steroids - Observe - Can use OTC arnica containing moisturizer such as Dermend Bruise Formula if desired - Call for worsening or other concerns   [place] Exam: dilated purple vessel at right chest  See photos  Exam:  Reassured benign. Compared under dermatoscope  HISTORY of INTERTRIGO Exam Inframammary clear Well controlled Intertrigo is a chronic recurrent rash that occurs in skin fold areas that may be associated with friction; heat; moisture; yeast; fungus; and bacteria.  It is exacerbated by increased movement / activity; sweating; and higher atmospheric temperature. Treatment Plan Patient using as needed for flares  Cont HC cream 2.5%  qd 3d/wk Tuesday, Thursday, Saturday Cont Ketoconazole  2% cr qd 3d/wk Monday, Wednesday, Friday   Topical steroids (such as triamcinolone, fluocinolone, fluocinonide, mometasone, clobetasol, halobetasol, betamethasone , hydrocortisone ) can cause thinning and lightening of the skin if they are used for too long in the same area. Your physician has selected the right strength medicine for your problem and area affected on  the body. Please use your medication only as directed by your physician to prevent side effects.    ROSACEA With Telangiectasias  Exam Mid face erythema with  telangiectasias  Chronic and persistent condition with duration or expected duration over one year. Condition is symptomatic / bothersome to patient. Not to goal. Rosacea is a chronic progressive skin condition usually affecting the face of adults, causing redness and/or acne bumps. It is treatable but not curable. It sometimes affects the eyes (ocular rosacea) as well. It may respond to topical and/or systemic medication and can flare with stress, sun exposure, alcohol, exercise, topical steroids (including hydrocortisone /cortisone 10) and some foods.  Daily application of broad spectrum spf 30+ sunscreen to face is recommended to reduce flares.  Patient denies grittiness of the eyes   Treatment Plan Counseling for BBL / IPL / Laser and Coordination of Care Discussed the treatment option of Broad Band Light (BBL) /Intense Pulsed Light (IPL)/ Laser for skin discoloration, including brown spots and redness.  Typically we recommend at least 1-3 treatment sessions about 5-8 weeks apart for best results.  Cannot have tanned skin when BBL performed, and regular use of sunscreen/photoprotection is advised after the procedure to help maintain results. The patient's condition may also require maintenance treatments in the future.  The fee for BBL / laser treatments is $350 per treatment session for the whole face.  A fee can be quoted for other parts of the body.  Insurance typically does not pay for BBL/laser treatments and therefore the fee is an out-of-pocket cost. Recommend prophylactic valtrex treatment. Once scheduled for procedure, will send Rx in prior to patient's appointment.    ACTINIC KERATOSIS left of midline of dorsum nose x 1 Actinic keratoses are precancerous spots that appear secondary to cumulative UV radiation exposure/sun exposure over time. They are chronic with expected duration over 1 year. A portion of actinic keratoses will progress to squamous cell carcinoma of the skin. It is not  possible to reliably predict which spots will progress to skin cancer and so treatment is recommended to prevent development of skin cancer.  Recommend daily broad spectrum sunscreen SPF 30+ to sun-exposed areas, reapply every 2 hours as needed.  Recommend staying in the shade or wearing long sleeves, sun glasses (UVA+UVB protection) and wide brim hats (4-inch brim around the entire circumference of the hat). Call for new or changing lesions. Destruction of lesion - left of midline of dorsum nose x 1 Complexity: simple   Destruction method: cryotherapy   Informed consent: discussed and consent obtained   Timeout:  patient name, date of birth, surgical site, and procedure verified Lesion destroyed using liquid nitrogen: Yes   Region frozen until ice ball extended beyond lesion: Yes   Outcome: patient tolerated procedure well with no complications   Post-procedure details: wound care instructions given    INFLAMED SEBORRHEIC KERATOSIS (4) chest x 4 (4) Symptomatic, irritating, patient would like treated. Destruction of lesion - chest x 4 (4) Complexity: simple   Destruction method: cryotherapy   Informed consent: discussed and consent obtained   Timeout:  patient name, date of birth, surgical site, and procedure verified Lesion destroyed using liquid nitrogen: Yes   Region frozen until ice ball extended beyond lesion: Yes   Outcome: patient tolerated procedure well with no complications   Post-procedure details: wound care instructions given    INTERTRIGO   Related Medications ketoconazole  (NIZORAL ) 2 % cream Apply to affected areas Monday, Wednesday and Friday at bedtime hydrocortisone  2.5 %  cream Apply to affected areas Tuesday, Thursday and Saturday at bedtime. PRN only Return in about 1 year (around 08/01/2024) for TBSE.  IEleanor Blush, CMA, am acting as scribe for Alm Rhyme, MD.   Documentation: I have reviewed the above documentation for accuracy and completeness,  and I agree with the above.  Alm Rhyme, MD

## 2023-08-02 NOTE — Patient Instructions (Addendum)

## 2023-08-03 ENCOUNTER — Encounter: Payer: Self-pay | Admitting: Dermatology

## 2023-08-05 ENCOUNTER — Other Ambulatory Visit: Payer: Self-pay | Admitting: Nurse Practitioner

## 2023-08-05 DIAGNOSIS — R42 Dizziness and giddiness: Secondary | ICD-10-CM

## 2023-08-16 ENCOUNTER — Ambulatory Visit: Admitting: Urology

## 2023-08-25 ENCOUNTER — Encounter: Payer: Self-pay | Admitting: Urology

## 2023-08-25 DIAGNOSIS — N390 Urinary tract infection, site not specified: Secondary | ICD-10-CM

## 2023-08-25 DIAGNOSIS — N3946 Mixed incontinence: Secondary | ICD-10-CM

## 2023-08-25 MED ORDER — GEMTESA 75 MG PO TABS: 75.0000 mg | ORAL_TABLET | Freq: Every day | ORAL | Status: DC

## 2023-08-25 NOTE — Addendum Note (Signed)
 Addended byBETHA CORIE PLATER on: 08/25/2023 02:04 PM   Modules accepted: Orders

## 2023-09-21 MED ORDER — GEMTESA 75 MG PO TABS: 75.0000 mg | ORAL_TABLET | Freq: Every day | ORAL | Status: DC

## 2023-09-21 NOTE — Addendum Note (Signed)
 Addended byBETHA CORIE PLATER on: 09/21/2023 01:36 PM   Modules accepted: Orders

## 2023-09-23 ENCOUNTER — Encounter: Payer: Self-pay | Admitting: Nurse Practitioner

## 2023-09-23 ENCOUNTER — Ambulatory Visit: Admitting: Nurse Practitioner

## 2023-09-23 VITALS — BP 135/80 | HR 99 | Temp 97.5°F | Resp 16 | Ht 65.0 in | Wt 167.0 lb

## 2023-09-23 DIAGNOSIS — R7303 Prediabetes: Secondary | ICD-10-CM

## 2023-09-23 DIAGNOSIS — J300 Vasomotor rhinitis: Secondary | ICD-10-CM | POA: Diagnosis not present

## 2023-09-23 DIAGNOSIS — Z1211 Encounter for screening for malignant neoplasm of colon: Secondary | ICD-10-CM

## 2023-09-23 DIAGNOSIS — I10 Essential (primary) hypertension: Secondary | ICD-10-CM

## 2023-09-23 DIAGNOSIS — F411 Generalized anxiety disorder: Secondary | ICD-10-CM

## 2023-09-23 DIAGNOSIS — F331 Major depressive disorder, recurrent, moderate: Secondary | ICD-10-CM

## 2023-09-23 DIAGNOSIS — J4520 Mild intermittent asthma, uncomplicated: Secondary | ICD-10-CM

## 2023-09-23 DIAGNOSIS — Z1212 Encounter for screening for malignant neoplasm of rectum: Secondary | ICD-10-CM

## 2023-09-23 DIAGNOSIS — F3342 Major depressive disorder, recurrent, in full remission: Secondary | ICD-10-CM

## 2023-09-23 MED ORDER — ALPRAZOLAM 0.5 MG PO TABS: ORAL_TABLET | ORAL | 3 refills | Status: DC

## 2023-09-23 MED ORDER — BUPROPION HCL ER (SR) 200 MG PO TB12: 200.0000 mg | ORAL_TABLET | Freq: Two times a day (BID) | ORAL | 1 refills | Status: DC

## 2023-09-23 MED ORDER — ALBUTEROL SULFATE HFA 108 (90 BASE) MCG/ACT IN AERS: 1.0000 | INHALATION_SPRAY | RESPIRATORY_TRACT | 1 refills | Status: AC | PRN

## 2023-09-23 MED ORDER — FLUTICASONE PROPIONATE 50 MCG/ACT NA SUSP: 1.0000 | Freq: Every day | NASAL | 3 refills | Status: AC

## 2023-09-23 MED ORDER — LISINOPRIL 10 MG PO TABS: 10.0000 mg | ORAL_TABLET | Freq: Every day | ORAL | 1 refills | Status: DC

## 2023-09-23 MED ORDER — DESVENLAFAXINE SUCCINATE ER 100 MG PO TB24: 100.0000 mg | ORAL_TABLET | Freq: Every day | ORAL | 1 refills | Status: DC

## 2023-09-23 NOTE — Progress Notes (Signed)
 [facility] [address] [place], [place] [address]  Internal MEDICINE  Office Visit Note  Patient Name: Amanda Mcgee  [id number]  [id number]  Date of Service: 09/23/2023  Chief Complaint  Patient presents with   Depression   Hypertension   Hyperlipidemia   Follow-up    HPI Amanda Mcgee presents for a follow-up visit for hypertension, allergies, screenings, anxiety and depression.  Hypertension -- controlled with amlodipine  and lisinopril  Allergies and rhinitis -- takes levocetirizine daily.  CRC screening -- due for colonoscopy.  Anxiety and depression --currently taking alprazolam  as needed and desvenlafaxine  daily.  Prediabetes -- not currently on any medication but eating low carb diet.     Current Medication: Outpatient Encounter Medications as of 09/23/2023  Medication Sig   albuterol  (VENTOLIN  HFA) 108 (90 Base) MCG/ACT inhaler Inhale 1-2 puffs into the lungs every 4 (four) hours as needed for wheezing or shortness of breath.   ALPRAZolam  (XANAX ) 0.5 MG tablet TAKE ONE TAB IN MORNING for anxiety AND ONE AND HALF AT NIGHT FOR anxiety/sleep   amLODipine  (NORVASC ) 5 MG tablet Take 1 tablet (5 mg total) by mouth daily.   buPROPion  (WELLBUTRIN  SR) 200 MG 12 hr tablet Take 1 tablet (200 mg total) by mouth 2 (two) times daily.   calcium  carbonate (OS-CAL) 600 MG TABS tablet Take 600 mg by mouth 2 (two) times daily with a meal.   cephALEXin  (KEFLEX ) 250 MG capsule Take 1 capsule (250 mg total) by mouth daily.   Cholecalciferol (VITAMIN D3) 400 units CAPS Take by mouth daily.   CRANBERRY PO Take 1 tablet by mouth 2 (two) times daily.   desvenlafaxine  (PRISTIQ ) 100 MG 24 hr tablet Take 1 tablet (100 mg total) by mouth daily.   docusate sodium  (COLACE) 100 MG capsule Take 100 mg by mouth 2 (two) times daily. prn   fluticasone  (FLONASE ) 50 MCG/ACT nasal spray Place 1 spray into both nostrils daily.   hydrocortisone  2.5 % cream Apply to affected areas Tuesday, Thursday and  Saturday at bedtime. PRN only   ketoconazole  (NIZORAL ) 2 % cream Apply to affected areas Monday, Wednesday and Friday at bedtime   levocetirizine (XYZAL ) 5 MG tablet Take 1 tablet (5 mg total) by mouth daily.   lisinopril  (ZESTRIL ) 10 MG tablet Take 1 tablet (10 mg total) by mouth daily.   meclizine  (ANTIVERT ) 25 MG tablet TAKE 1 TABLET BY MOUTH TWICE A DAY AS NEEDED FOR DIZZINESS   mupirocin  ointment (BACTROBAN ) 2 % Apply 1 Application topically daily. To affected area until resolved.   [DISCONTINUED] albuterol  (VENTOLIN  HFA) 108 (90 Base) MCG/ACT inhaler Inhale 1-2 puffs into the lungs every 4 (four) hours as needed for wheezing or shortness of breath.   [DISCONTINUED] ALPRAZolam  (XANAX ) 0.5 MG tablet TAKE ONE TAB IN MORNING for anxiety AND ONE AND HALF AT NIGHT FOR anxiety/sleep   [DISCONTINUED] buPROPion  (WELLBUTRIN  SR) 200 MG 12 hr tablet Take 1 tablet (200 mg total) by mouth 2 (two) times daily.   [DISCONTINUED] desvenlafaxine  (PRISTIQ ) 100 MG 24 hr tablet Take 1 tablet (100 mg total) by mouth daily.   [DISCONTINUED] fluticasone  (FLONASE ) 50 MCG/ACT nasal spray Place 1 spray into both nostrils daily.   [DISCONTINUED] lisinopril  (ZESTRIL ) 10 MG tablet Take 1 tablet (10 mg total) by mouth daily.   [DISCONTINUED] lovastatin  (MEVACOR ) 20 MG tablet Take 1 tablet (20 mg total) by mouth at bedtime.   [DISCONTINUED] meloxicam (MOBIC) 15 MG tablet Take 15 mg by mouth daily.   [DISCONTINUED] methocarbamol (ROBAXIN) 500  MG tablet Take 500 mg by mouth 3 (three) times daily.   [DISCONTINUED] Vibegron  (GEMTESA ) 75 MG TABS Take 1 tablet (75 mg total) by mouth daily.   No facility-administered encounter medications on file as of 09/23/2023.    Surgical History: Past Surgical History:  Procedure Laterality Date   ABDOMINAL HYSTERECTOMY  1990   APPENDECTOMY  1969   BLADDER SURGERY  2011   BREAST BIOPSY Left    benign   COLONOSCOPY WITH PROPOFOL  N/A 02/01/2018   Procedure: COLONOSCOPY WITH PROPOFOL ;   Surgeon: Therisa Bi, MD;  Location: [facility] ENDOSCOPY;  Service: Gastroenterology;  Laterality: N/A;   LITHOTRIPSY  06/2011    Medical History: Past Medical History:  Diagnosis Date   Actinic keratosis    Anxiety    Arthritis    Depression    HTN (hypertension)    Hyperlipidemia    Kidney stones 2013    Family History: Family History  Problem Relation Age of Onset   Breast cancer Mother 45   Heart attack Son     Social History   Socioeconomic History   Marital status: Married    Spouse name: Not on file   Number of children: Not on file   Years of education: Not on file   Highest education level: Not on file  Occupational History   Not on file  Tobacco Use   Smoking status: Every Day    Current packs/day: 0.50    Average packs/day: 0.5 packs/day for 30.0 years (15.0 ttl pk-yrs)    Types: Cigarettes    Passive exposure: Current   Smokeless tobacco: Never   Tobacco comments:    1/2 pack daily  Vaping Use   Vaping status: Never Used  Substance and Sexual Activity   Alcohol use: Yes    Comment: occassionally   Drug use: No   Sexual activity: Never  Other Topics Concern   Not on file  Social History Narrative   Not on file   Social Drivers of Health   Financial Resource Strain: Not on file  Food Insecurity: Not on file  Transportation Needs: Not on file  Physical Activity: Not on file  Stress: Not on file  Social Connections: Not on file  Intimate Partner Violence: Not on file      Review of Systems  Constitutional:  Negative for chills, fatigue and unexpected weight change.  HENT:  Negative for congestion, rhinorrhea, sneezing and sore throat.   Eyes:  Negative for redness.  Respiratory: Negative.  Negative for cough, chest tightness, shortness of breath and wheezing.   Cardiovascular: Negative.  Negative for chest pain and palpitations.  Gastrointestinal:  Negative for abdominal pain, constipation, diarrhea, nausea and vomiting.  Genitourinary:   Negative for dysuria and frequency.  Musculoskeletal: Negative.  Negative for arthralgias, back pain, joint swelling and neck pain.  Skin:  Negative for rash.  Neurological: Negative.  Negative for tremors and numbness.  Hematological:  Negative for adenopathy. Does not bruise/bleed easily.  Psychiatric/Behavioral:  Negative for behavioral problems (Depression), self-injury, sleep disturbance and suicidal ideas. The patient is nervous/anxious.     Vital Signs: BP 135/80 Comment: 140/84  Pulse 99   Temp (!) 97.5 F (36.4 C)   Resp 16   Ht 5' 5 (1.651 m)   Wt 167 lb (75.8 kg)   SpO2 98%   BMI 27.79 kg/m    Physical Exam Vitals reviewed.  Constitutional:      General: She is not in acute distress.  Appearance: Normal appearance. She is not ill-appearing.  HENT:     Head: Normocephalic and atraumatic.  Eyes:     Pupils: Pupils are equal, round, and reactive to light.  Cardiovascular:     Rate and Rhythm: Normal rate and regular rhythm.  Pulmonary:     Effort: Pulmonary effort is normal. No respiratory distress.  Skin:    Capillary Refill: Capillary refill takes less than 2 seconds.  Neurological:     Mental Status: She is alert and oriented to person, place, and time.  Psychiatric:        Mood and Affect: Mood normal.        Behavior: Behavior normal.        Assessment/Plan: 1. Essential hypertension (Primary) Stable, continue lisinopril  and amlodipine  as prescribed. - lisinopril  (ZESTRIL ) 10 MG tablet; Take 1 tablet (10 mg total) by mouth daily.  Dispense: 90 tablet; Refill: 1  2. Mild intermittent asthma without complication Continue prn albuterol  inhaler as prescribed.  - albuterol  (VENTOLIN  HFA) 108 (90 Base) MCG/ACT inhaler; Inhale 1-2 puffs into the lungs every 4 (four) hours as needed for wheezing or shortness of breath.  Dispense: 18 each; Refill: 1  3. Prediabetes Continue low carb diet and physical activity as tolerated.   4. Vasomotor  rhinitis Continue levocetirizine and flonase  as prescribed.  - fluticasone  (FLONASE ) 50 MCG/ACT nasal spray; Place 1 spray into both nostrils daily.  Dispense: 32 mL; Refill: 3  5. Screening for colorectal cancer Referred to GI for colonoscopy  - Ambulatory referral to Gastroenterology  6. Generalized anxiety disorder Continue desvenlafaxine  and prn alprazolam  as prescribed.  - ALPRAZolam  (XANAX ) 0.5 MG tablet; TAKE ONE TAB IN MORNING for anxiety AND ONE AND HALF AT NIGHT FOR anxiety/sleep  Dispense: 75 tablet; Refill: 3 - desvenlafaxine  (PRISTIQ ) 100 MG 24 hr tablet; Take 1 tablet (100 mg total) by mouth daily.  Dispense: 90 tablet; Refill: 1  7. Moderate episode of recurrent major depressive disorder (HCC) Continue bupropion  and desvenlafaxine  as prescribed.  - buPROPion  (WELLBUTRIN  SR) 200 MG 12 hr tablet; Take 1 tablet (200 mg total) by mouth 2 (two) times daily.  Dispense: 180 tablet; Refill: 1 - desvenlafaxine  (PRISTIQ ) 100 MG 24 hr tablet; Take 1 tablet (100 mg total) by mouth daily.  Dispense: 90 tablet; Refill: 1   General Counseling: Amanda Mcgee verbalizes understanding of the findings of todays visit and agrees with plan of treatment. I have discussed any further diagnostic evaluation that may be needed or ordered today. We also reviewed her medications today. she has been encouraged to call the office with any questions or concerns that should arise related to todays visit.    Orders Placed This Encounter  Procedures   Ambulatory referral to Gastroenterology    Meds ordered this encounter  Medications   ALPRAZolam  (XANAX ) 0.5 MG tablet    Sig: TAKE ONE TAB IN MORNING for anxiety AND ONE AND HALF AT NIGHT FOR anxiety/sleep    Dispense:  75 tablet    Refill:  3    For future refills, keep on file   buPROPion  (WELLBUTRIN  SR) 200 MG 12 hr tablet    Sig: Take 1 tablet (200 mg total) by mouth 2 (two) times daily.    Dispense:  180 tablet    Refill:  1    For future refill    desvenlafaxine  (PRISTIQ ) 100 MG 24 hr tablet    Sig: Take 1 tablet (100 mg total) by mouth daily.    Dispense:  90  tablet    Refill:  1    Ok to fill as 90 day prescription, for future refills   lisinopril  (ZESTRIL ) 10 MG tablet    Sig: Take 1 tablet (10 mg total) by mouth daily.    Dispense:  90 tablet    Refill:  1    For future refills   albuterol  (VENTOLIN  HFA) 108 (90 Base) MCG/ACT inhaler    Sig: Inhale 1-2 puffs into the lungs every 4 (four) hours as needed for wheezing or shortness of breath.    Dispense:  18 each    Refill:  1    Patient will call pharmacy if she needs refill, do not fill until she request it.   fluticasone  (FLONASE ) 50 MCG/ACT nasal spray    Sig: Place 1 spray into both nostrils daily.    Dispense:  32 mL    Refill:  3    Return for F/U, anxiety med refill, Callee Rohrig PCP.   Total time spent:30 Minutes Time spent includes review of chart, medications, test results, and follow up plan with the patient.    Controlled Substance Database was reviewed by me.  This patient was seen by Mardy Maxin, FNP-C in collaboration with Dr. Sigrid Bathe as a part of collaborative care agreement.   Amanda Hay R. Maxin, MSN, FNP-C Internal medicine

## 2023-09-24 ENCOUNTER — Telehealth: Payer: Self-pay | Admitting: Nurse Practitioner

## 2023-09-24 NOTE — Telephone Encounter (Signed)
 Awaiting 09/23/23 office notes for Gastroenterology Lenoria

## 2023-10-07 ENCOUNTER — Encounter: Payer: Self-pay | Admitting: Nurse Practitioner

## 2023-11-01 ENCOUNTER — Ambulatory Visit: Admitting: Urology

## 2023-11-01 ENCOUNTER — Telehealth: Payer: Self-pay | Admitting: Urology

## 2023-11-01 DIAGNOSIS — N3946 Mixed incontinence: Secondary | ICD-10-CM

## 2023-11-01 MED ORDER — GEMTESA 75 MG PO TABS: 75.0000 mg | ORAL_TABLET | Freq: Every day | ORAL | Status: AC

## 2023-11-01 NOTE — Telephone Encounter (Signed)
 Pt informed of gemtesa  samples, voiced understanding.

## 2023-11-01 NOTE — Addendum Note (Signed)
 Addended byBETHA CORIE PLATER on: 11/01/2023 04:04 PM   Modules accepted: Orders

## 2023-11-01 NOTE — Telephone Encounter (Signed)
 Pt called in this morning to cxl her appt due to she is sick but she also wanted to know if she could get more Gemtesa  samples. She states tha Dr. MacDiarmid gives them to her cause they cost too much.

## 2023-11-06 ENCOUNTER — Encounter: Payer: Self-pay | Admitting: Nurse Practitioner

## 2023-11-06 DIAGNOSIS — R7303 Prediabetes: Secondary | ICD-10-CM | POA: Insufficient documentation

## 2023-11-08 ENCOUNTER — Ambulatory Visit (INDEPENDENT_AMBULATORY_CARE_PROVIDER_SITE_OTHER): Admitting: Urology

## 2023-11-08 VITALS — BP 149/80 | HR 82 | Ht 65.5 in | Wt 162.0 lb

## 2023-11-08 DIAGNOSIS — N390 Urinary tract infection, site not specified: Secondary | ICD-10-CM | POA: Diagnosis not present

## 2023-11-08 DIAGNOSIS — N3946 Mixed incontinence: Secondary | ICD-10-CM

## 2023-11-08 LAB — MICROSCOPIC EXAMINATION

## 2023-11-08 LAB — URINALYSIS, COMPLETE
Bilirubin, UA: NEGATIVE
Glucose, UA: NEGATIVE
Ketones, UA: NEGATIVE
Leukocytes,UA: NEGATIVE
Nitrite, UA: NEGATIVE
Protein,UA: NEGATIVE
Specific Gravity, UA: 1.01 (ref 1.005–1.030)
Urobilinogen, Ur: 0.2 mg/dL (ref 0.2–1.0)
pH, UA: 6.5 (ref 5.0–7.5)

## 2023-11-08 NOTE — Patient Instructions (Signed)
Med[employer]ronic InterStim Placement  Past treatments haven't given you satisfactory results, which may be due to the way these treatments focus on the muscle rather than the nerves. They don't target the miscommunication between your bladder and your brain. Because bladder function involves both muscles and nerves, you may need something that addresses the communication problem between the bladder and the brain that can cause symptoms.  Medtronic Bladder Control Therapy is unique because it offers a simple evaluation to see if it's right for you. The evaluation is a minimally invasive procedure We will be looking to see if your troublesome bladder symptoms are reduced by at least 50% In as few as 3 days, you'll know if:you're a candidate for long-term treatment we simply need more information   It's important that you document your symptoms before and during your evaluation to help me understand if you see any improvements. I will provide you with a Symptom Tracker and I would like you to document your symptoms for a minimum of three days. Bring your Symptom Tracker back with you at your next appointment.  Tracking daily will help [place] determine if the therapy delivered by the InterStimT system is right for you. You will have 3 appointments scheduled: 1st appointment for a 20-minute, in-office procedure that allows you to try the therapy for a few days. 2nd appointment to remove the lead (thin wire) from your evaluation. 3rd appointment is the date for long-term therapy if you're a candidate or for an advanced evaluation if we need more information.  Here's a brochure explaining the in-office procedure and long-term therapy, as well as the Symptom Tracker we discussed to document your symptoms. For more information, visit [email or web address].

## 2023-11-08 NOTE — Progress Notes (Signed)
 11/08/2023 9:48 AM   Amanda Mcgee 15-May-1955 [id number]  Referring provider: Liana Fish, NP [address] [place],  [place] [address]  No chief complaint on file.   HPI: Reviewed lengthy note.  Last visit reviewed 3 refractory OAB therapies as well as bulking agent treatment   On daily Keflex  for urinary tract infections.  Recent urine culture negative.  Clinically not infected.  Frequency stable.  Urge incontinence a bit worse.   On pelvic examination she had grade 2 hypermobility the bladder neck and minimal cystocele.   Cystoscopy and Botox : Patient underwent cystoscopy.  Bladder mucosa and trigone were normal.  No cystitis.  No carcinoma.  I injected 100 units of Botox  and 10 cc of normal saline 1 cc/inj. with my modified template that was more midline and somewhat lateral.  Each injection she felt the needle but then did well with the injection.  No bleeding.   She is still on Gemtesa  as a partial responder    Today Patient did not respond very well to the Botox  and still uses 6 or 7 pads a day.  She has urge incontinence.  There is no question she still has moderate stress incontinence.  She still has bedwetting.  Clinically not infected.  We both agreed not to repeat the Botox .  She has had 2 slings in the past and vaginal access would make vaginal surgery more challenging.  Bulking agent under anesthesia would be an option for her stress incontinence.  She has always had mixed incontinence with bedwetting.  She was on daily Keflex  and cannot take Macrodantin  has a lot of allergies.  She was loaded with trimethoprim  she failed oxybutynin  and Myrbetriq .  She had low leak point pressures during urodynamics.  She is on Gemtesa  as a partial responder but it is very expensive.  Urge incontinence has been more severe than stress incontinence in the past.  I have discussed 3 refractory treatments as well as bulking agent with full templates.  She says she can live with her stress  incontinence in the past.      PMH: Past Medical History:  Diagnosis Date   Actinic keratosis    Anxiety    Arthritis    Depression    HTN (hypertension)    Hyperlipidemia    Kidney stones 2013    Surgical History: Past Surgical History:  Procedure Laterality Date   ABDOMINAL HYSTERECTOMY  1990   APPENDECTOMY  1969   BLADDER SURGERY  2011   BREAST BIOPSY Left    benign   COLONOSCOPY WITH PROPOFOL  N/A 02/01/2018   Procedure: COLONOSCOPY WITH PROPOFOL ;  Surgeon: Therisa Bi, MD;  Location: [facility] ENDOSCOPY;  Service: Gastroenterology;  Laterality: N/A;   LITHOTRIPSY  06/2011    Home Medications:  Allergies as of 11/08/2023       Reactions   Bactrim  [sulfamethoxazole -trimethoprim ]    Heart palpitations    Benadryl [diphenhydramine Hcl] Swelling   Biaxin [clarithromycin]    Ciprofloxacin     Codeine Swelling   Hydrochlorothiazide    Macrobid  [nitrofurantoin ] Nausea And Vomiting   Penicillins    Upset stomach   Prednisone    Aspirin  Nausea Only        Medication List        Accurate as of November 08, 2023  9:48 AM. If you have any questions, ask your nurse or doctor.          albuterol  108 (90 Base) MCG/ACT inhaler Commonly known as: VENTOLIN  HFA Inhale 1-2  puffs into the lungs every 4 (four) hours as needed for wheezing or shortness of breath.   ALPRAZolam  0.5 MG tablet Commonly known as: XANAX  TAKE ONE TAB IN MORNING for anxiety AND ONE AND HALF AT NIGHT FOR anxiety/sleep   amLODipine  5 MG tablet Commonly known as: NORVASC  Take 1 tablet (5 mg total) by mouth daily.   buPROPion  200 MG 12 hr tablet Commonly known as: WELLBUTRIN  SR Take 1 tablet (200 mg total) by mouth 2 (two) times daily.   calcium  carbonate 600 MG Tabs tablet Commonly known as: OS-CAL Take 600 mg by mouth 2 (two) times daily with a meal.   cephALEXin  250 MG capsule Commonly known as: KEFLEX  Take 1 capsule (250 mg total) by mouth daily.   CRANBERRY PO Take 1 tablet by mouth  2 (two) times daily.   desvenlafaxine  100 MG 24 hr tablet Commonly known as: PRISTIQ  Take 1 tablet (100 mg total) by mouth daily.   docusate sodium  100 MG capsule Commonly known as: COLACE Take 100 mg by mouth 2 (two) times daily. prn   fluticasone  50 MCG/ACT nasal spray Commonly known as: FLONASE  Place 1 spray into both nostrils daily.   Gemtesa  75 MG Tabs Generic drug: Vibegron  Take 1 tablet (75 mg total) by mouth daily.   levocetirizine 5 MG tablet Commonly known as: XYZAL  Take 1 tablet (5 mg total) by mouth daily.   lisinopril  10 MG tablet Commonly known as: ZESTRIL  Take 1 tablet (10 mg total) by mouth daily.   meclizine  25 MG tablet Commonly known as: ANTIVERT  TAKE 1 TABLET BY MOUTH TWICE A DAY AS NEEDED FOR DIZZINESS   mupirocin  ointment 2 % Commonly known as: BACTROBAN  Apply 1 Application topically daily. To affected area until resolved.   Vitamin D3 10 MCG (400 UNIT) Caps Take by mouth daily.        Allergies:  Allergies  Allergen Reactions   Bactrim  [Sulfamethoxazole -Trimethoprim ]     Heart palpitations    Benadryl [Diphenhydramine Hcl] Swelling   Biaxin [Clarithromycin]    Ciprofloxacin     Codeine Swelling   Hydrochlorothiazide    Macrobid  [Nitrofurantoin ] Nausea And Vomiting   Penicillins     Upset stomach    Prednisone    Aspirin  Nausea Only    Family History: Family History  Problem Relation Age of Onset   Breast cancer Mother 59   Heart attack Son     Social History:  reports that she has been smoking cigarettes. She has a 15 pack-year smoking history. She has been exposed to tobacco smoke. She has never used smokeless tobacco. She reports current alcohol use. She reports that she does not use drugs.  ROS:                                        Physical Exam: There were no vitals taken for this visit.  Constitutional:  Alert and oriented, No acute distress. HEENT: [place] AT, moist mucus membranes.  Trachea  midline, no masses.   Laboratory Data: Lab Results  Component Value Date   WBC 7.3 02/04/2023   HGB 14.4 02/04/2023   HCT 42.4 02/04/2023   MCV 94 02/04/2023   PLT 222 02/04/2023    Lab Results  Component Value Date   CREATININE 0.81 02/04/2023    No results found for: PSA  No results found for: TESTOSTERONE  Lab Results  Component Value Date  HGBA1C 6.2 (H) 02/04/2023    Urinalysis    Component Value Date/Time   COLORURINE Straw 08/06/2011 1810   COLORURINE YELLOW 05/09/2010 2235   APPEARANCEUR Clear 03/22/2023 1102   LABSPEC 1.023 08/06/2011 1810   PHURINE 7.0 08/06/2011 1810   PHURINE 6.5 05/09/2010 2235   GLUCOSEU Negative 03/22/2023 1102   GLUCOSEU Negative 08/06/2011 1810   HGBUR 3+ 08/06/2011 1810   HGBUR LARGE (A) 05/09/2010 2235   BILIRUBINUR Negative 03/22/2023 1102   BILIRUBINUR Negative 08/06/2011 1810   KETONESUR Negative 08/06/2011 1810   KETONESUR NEGATIVE 05/09/2010 2235   PROTEINUR Negative 03/22/2023 1102   PROTEINUR Negative 08/06/2011 1810   PROTEINUR 100 (A) 05/09/2010 2235   UROBILINOGEN 0.2 04/30/2020 1020   UROBILINOGEN 1.0 05/09/2010 2235   NITRITE Negative 03/22/2023 1102   NITRITE Negative 08/06/2011 1810   NITRITE NEGATIVE 05/09/2010 2235   LEUKOCYTESUR Negative 03/22/2023 1102   LEUKOCYTESUR 3+ 08/06/2011 1810    Pertinent Imaging: Urine reviewed and sent for culture  Assessment & Plan: The patient I spoke about InterStim again with full template.  I think the best choice at this stage is to get into a peripheral nerve evaluation.  If this works we will continue on this pathway.  If it fails I would recommend a bulking agent under anesthesia.  She agreed with the plan.  Call if culture positive.  Clinically not infected and she will stay on her daily Keflex .  Site of service discussed  1. Recurrent UTI (Primary)  - Urinalysis, Complete  2. Mixed incontinence  - Urinalysis, Complete   No follow-ups on  file.  Glendia DELENA Elizabeth, MD  [facility] [address], [address] [place], [place] [address] [phone]

## 2023-11-09 ENCOUNTER — Telehealth: Payer: Self-pay | Admitting: Nurse Practitioner

## 2023-11-09 NOTE — Telephone Encounter (Signed)
 Gastroenterology referral sent via [facility] to [facility]. Gave patient telephone # [phone]

## 2023-11-13 LAB — CULTURE, URINE COMPREHENSIVE

## 2023-11-24 ENCOUNTER — Telehealth: Payer: Self-pay | Admitting: Nurse Practitioner

## 2023-11-24 NOTE — Telephone Encounter (Signed)
 Gastroenterology appointment 02/10/2024 with [facility]

## 2024-01-12 ENCOUNTER — Encounter: Payer: Self-pay | Admitting: Nurse Practitioner

## 2024-01-12 ENCOUNTER — Ambulatory Visit (INDEPENDENT_AMBULATORY_CARE_PROVIDER_SITE_OTHER): Payer: Medicare Other | Admitting: Nurse Practitioner

## 2024-01-12 VITALS — BP 138/80 | HR 79 | Temp 95.9°F | Resp 16 | Ht 65.5 in | Wt 166.2 lb

## 2024-01-12 DIAGNOSIS — G720 Drug-induced myopathy: Secondary | ICD-10-CM | POA: Diagnosis not present

## 2024-01-12 DIAGNOSIS — R300 Dysuria: Secondary | ICD-10-CM

## 2024-01-12 DIAGNOSIS — E538 Deficiency of other specified B group vitamins: Secondary | ICD-10-CM | POA: Diagnosis not present

## 2024-01-12 DIAGNOSIS — E782 Mixed hyperlipidemia: Secondary | ICD-10-CM | POA: Diagnosis not present

## 2024-01-12 DIAGNOSIS — Z1231 Encounter for screening mammogram for malignant neoplasm of breast: Secondary | ICD-10-CM | POA: Diagnosis not present

## 2024-01-12 DIAGNOSIS — R7303 Prediabetes: Secondary | ICD-10-CM

## 2024-01-12 DIAGNOSIS — I10 Essential (primary) hypertension: Secondary | ICD-10-CM | POA: Diagnosis not present

## 2024-01-12 DIAGNOSIS — E559 Vitamin D deficiency, unspecified: Secondary | ICD-10-CM

## 2024-01-12 DIAGNOSIS — Z0001 Encounter for general adult medical examination with abnormal findings: Secondary | ICD-10-CM | POA: Diagnosis not present

## 2024-01-12 DIAGNOSIS — F411 Generalized anxiety disorder: Secondary | ICD-10-CM | POA: Diagnosis not present

## 2024-01-12 DIAGNOSIS — L738 Other specified follicular disorders: Secondary | ICD-10-CM

## 2024-01-12 DIAGNOSIS — J3089 Other allergic rhinitis: Secondary | ICD-10-CM

## 2024-01-12 DIAGNOSIS — R5383 Other fatigue: Secondary | ICD-10-CM

## 2024-01-12 DIAGNOSIS — M720 Palmar fascial fibromatosis [Dupuytren]: Secondary | ICD-10-CM | POA: Diagnosis not present

## 2024-01-12 DIAGNOSIS — F331 Major depressive disorder, recurrent, moderate: Secondary | ICD-10-CM | POA: Diagnosis not present

## 2024-01-12 DIAGNOSIS — J4520 Mild intermittent asthma, uncomplicated: Secondary | ICD-10-CM | POA: Diagnosis not present

## 2024-01-12 DIAGNOSIS — M064 Inflammatory polyarthropathy: Secondary | ICD-10-CM | POA: Diagnosis not present

## 2024-01-12 MED ORDER — BUPROPION HCL ER (SR) 200 MG PO TB12: 200.0000 mg | ORAL_TABLET | Freq: Two times a day (BID) | ORAL | 1 refills | Status: AC

## 2024-01-12 MED ORDER — MUPIROCIN 2 % EX OINT: 1.0000 | TOPICAL_OINTMENT | Freq: Every day | CUTANEOUS | 1 refills | Status: AC

## 2024-01-12 MED ORDER — DESVENLAFAXINE SUCCINATE ER 100 MG PO TB24: 100.0000 mg | ORAL_TABLET | Freq: Every day | ORAL | 1 refills | Status: AC

## 2024-01-12 MED ORDER — ALPRAZOLAM 0.5 MG PO TABS: ORAL_TABLET | ORAL | 3 refills | Status: AC

## 2024-01-12 MED ORDER — DOXYCYCLINE HYCLATE 100 MG PO TABS: 100.0000 mg | ORAL_TABLET | Freq: Two times a day (BID) | ORAL | 0 refills | Status: AC

## 2024-01-12 MED ORDER — LEVOCETIRIZINE DIHYDROCHLORIDE 5 MG PO TABS: 5.0000 mg | ORAL_TABLET | Freq: Every day | ORAL | 1 refills | Status: AC

## 2024-01-12 MED ORDER — LISINOPRIL 10 MG PO TABS: 10.0000 mg | ORAL_TABLET | Freq: Every day | ORAL | 1 refills | Status: AC

## 2024-01-12 NOTE — Progress Notes (Signed)
 [facility] [address] [place], [place] [address]  Internal MEDICINE  Office Visit Note  Patient Name: Amanda Mcgee  [id number]  [id number]  Date of Service: 01/12/2024  Chief Complaint  Patient presents with   Depression   Hypertension   Hyperlipidemia   Medicare Wellness    HPI Amanda Mcgee presents for an annual well visit and physical exam.  Well-appearing 68 y.o. female/female with [PMH]  Routine CRC screening: routine colonoscopy scheduled for 02/10/24 with Dr. Maryruth.  Routine mammogram: due in march 2026 DEXA scan: done in may this year  Pap smear: discontinued, aged out  Eye exam and/or foot exam: Labs: due for routine labs  New or worsening pain: chronic bilateral knee pain  Other concerns: Right hand bony prominence on lateral aspect of the proximal joint at the base of the thumb Dupuytren's contracture of the right hand Statin myopathy -- patient had a lot of soreness and joint pains while on lovastatin .  Chronic arthritis pain of both knees and sometimes has pain in her shoulders and neck but thinks that is due to stress. No family history of RA or other autoimmune disorders.  Wants to stop amlodipine  but continue lisinopril . Her blood pressure is usually 120s/80s at home.  Taking gemtesa  for OAB Breast exam done today.       01/12/2024    9:10 AM 01/11/2023   10:14 AM 01/06/2022    9:05 AM  MMSE - Mini Mental State Exam  Orientation to time 5 5 5   Orientation to Place 5 5 5   Registration 3 3 3   Attention/ Calculation 5 5 5   Recall 3 3 3   Language- name 2 objects 2 2 2   Language- repeat 1 1 1   Language- follow 3 step command 3 3 3   Language- read & follow direction 1 1 1   Write a sentence 1 1 1   Copy design 1 1 1   Total score 30 30 30     Functional Status Survey: Is the patient deaf or have difficulty hearing?: No Does the patient have difficulty seeing, even when wearing glasses/contacts?: No Does the patient have difficulty  concentrating, remembering, or making decisions?: No Does the patient have difficulty walking or climbing stairs?: Yes Does the patient have difficulty dressing or bathing?: No Does the patient have difficulty doing errands alone such as visiting a doctor's office or shopping?: No     09/24/2021    8:51 AM 01/06/2022    9:05 AM 04/03/2022    9:44 AM 01/11/2023   10:13 AM 01/12/2024    9:10 AM  Fall Risk  Falls in the past year? 1 0 0 1 1  Was there an injury with Fall? 0   0  0  0  Fall Risk Category Calculator 1  0 1 1  Fall Risk Category (Retired) Low       (RETIRED) Patient Fall Risk Level Low fall risk       Patient at Risk for Falls Due to No Fall Risks  No Fall Risks    Fall risk Follow up Falls evaluation completed   Falls evaluation completed Falls evaluation completed Falls evaluation completed     Data saved with a previous flowsheet row definition       01/11/2023    1:16 PM  Depression screen PHQ 2/9  Decreased Interest 0  Down, Depressed, Hopeless 0  PHQ - 2 Score 0        No data to display  Current Medication: Outpatient Encounter Medications as of 01/12/2024  Medication Sig   doxycycline  (VIBRA -TABS) 100 MG tablet Take 1 tablet (100 mg total) by mouth 2 (two) times daily for 7 days. On empty stomach   albuterol  (VENTOLIN  HFA) 108 (90 Base) MCG/ACT inhaler Inhale 1-2 puffs into the lungs every 4 (four) hours as needed for wheezing or shortness of breath.   ALPRAZolam  (XANAX ) 0.5 MG tablet TAKE ONE TAB IN MORNING for anxiety AND ONE AND HALF AT NIGHT FOR anxiety/sleep   amLODipine  (NORVASC ) 5 MG tablet Take 1 tablet (5 mg total) by mouth daily.   buPROPion  (WELLBUTRIN  SR) 200 MG 12 hr tablet Take 1 tablet (200 mg total) by mouth 2 (two) times daily.   calcium  carbonate (OS-CAL) 600 MG TABS tablet Take 600 mg by mouth 2 (two) times daily with a meal.   cephALEXin  (KEFLEX ) 250 MG capsule Take 1 capsule (250 mg total) by mouth daily.    Cholecalciferol (VITAMIN D3) 400 units CAPS Take by mouth daily.   CRANBERRY PO Take 1 tablet by mouth 2 (two) times daily.   desvenlafaxine  (PRISTIQ ) 100 MG 24 hr tablet Take 1 tablet (100 mg total) by mouth daily.   docusate sodium  (COLACE) 100 MG capsule Take 100 mg by mouth 2 (two) times daily. prn   fluticasone  (FLONASE ) 50 MCG/ACT nasal spray Place 1 spray into both nostrils daily.   levocetirizine (XYZAL ) 5 MG tablet Take 1 tablet (5 mg total) by mouth daily.   lisinopril  (ZESTRIL ) 10 MG tablet Take 1 tablet (10 mg total) by mouth daily.   meclizine  (ANTIVERT ) 25 MG tablet TAKE 1 TABLET BY MOUTH TWICE A DAY AS NEEDED FOR DIZZINESS   mupirocin  ointment (BACTROBAN ) 2 % Apply 1 Application topically daily. To affected area until resolved.   Vibegron  (GEMTESA ) 75 MG TABS Take 1 tablet (75 mg total) by mouth daily.   [DISCONTINUED] ALPRAZolam  (XANAX ) 0.5 MG tablet TAKE ONE TAB IN MORNING for anxiety AND ONE AND HALF AT NIGHT FOR anxiety/sleep   [DISCONTINUED] buPROPion  (WELLBUTRIN  SR) 200 MG 12 hr tablet Take 1 tablet (200 mg total) by mouth 2 (two) times daily.   [DISCONTINUED] desvenlafaxine  (PRISTIQ ) 100 MG 24 hr tablet Take 1 tablet (100 mg total) by mouth daily.   [DISCONTINUED] levocetirizine (XYZAL ) 5 MG tablet Take 1 tablet (5 mg total) by mouth daily.   [DISCONTINUED] lisinopril  (ZESTRIL ) 10 MG tablet Take 1 tablet (10 mg total) by mouth daily.   [DISCONTINUED] mupirocin  ointment (BACTROBAN ) 2 % Apply 1 Application topically daily. To affected area until resolved.   No facility-administered encounter medications on file as of 01/12/2024.    Surgical History: Past Surgical History:  Procedure Laterality Date   ABDOMINAL HYSTERECTOMY  1990   APPENDECTOMY  1969   BLADDER SURGERY  2011   BREAST BIOPSY Left    benign   COLONOSCOPY WITH PROPOFOL  N/A 02/01/2018   Procedure: COLONOSCOPY WITH PROPOFOL ;  Surgeon: Therisa Bi, MD;  Location: [facility] ENDOSCOPY;  Service: Gastroenterology;   Laterality: N/A;   LITHOTRIPSY  06/2011    Medical History: Past Medical History:  Diagnosis Date   Actinic keratosis    Anxiety    Arthritis    Depression    HTN (hypertension)    Hyperlipidemia    Kidney stones 2013    Family History: Family History  Problem Relation Age of Onset   Breast cancer Mother 74   Heart attack Son     Social History   Socioeconomic History  Marital status: Married    Spouse name: Not on file   Number of children: Not on file   Years of education: Not on file   Highest education level: Not on file  Occupational History   Not on file  Tobacco Use   Smoking status: Every Day    Current packs/day: 0.50    Average packs/day: 0.5 packs/day for 30.0 years (15.0 ttl pk-yrs)    Types: Cigarettes    Passive exposure: Current   Smokeless tobacco: Never   Tobacco comments:    1/2 pack daily  Vaping Use   Vaping status: Never Used  Substance and Sexual Activity   Alcohol use: Yes    Comment: occassionally   Drug use: No   Sexual activity: Never  Other Topics Concern   Not on file  Social History Narrative   Not on file   Social Drivers of Health   Tobacco Use: High Risk (01/12/2024)   Patient History    Smoking Tobacco Use: Every Day    Smokeless Tobacco Use: Never    Passive Exposure: Current  Financial Resource Strain: Not on file  Food Insecurity: Not on file  Transportation Needs: Not on file  Physical Activity: Not on file  Stress: Not on file  Social Connections: Not on file  Intimate Partner Violence: Not on file  Depression (PHQ2-9): Low Risk (01/11/2023)   Depression (PHQ2-9)    PHQ-2 Score: 0  Alcohol Screen: Low Risk (01/06/2022)   Alcohol Screen    Last Alcohol Screening Score (AUDIT): 1  Housing: Unknown (11/26/2023)   Received from [facility]   Epic    Unable to Pay for Housing in the Last Year: Not on file    Number of Times Moved in the Last Year: Not on file    At any time in the past  12 months, were you homeless or living in a shelter (including now)?: No  Utilities: Not on file  Health Literacy: Not on file      Review of Systems  Constitutional:  Negative for activity change, appetite change, chills, fatigue, fever and unexpected weight change.  HENT: Negative.  Negative for congestion, ear pain, rhinorrhea, sore throat and trouble swallowing.   Eyes: Negative.   Respiratory: Negative.  Negative for cough, chest tightness, shortness of breath and wheezing.   Cardiovascular: Negative.  Negative for chest pain.  Gastrointestinal: Negative.  Negative for abdominal pain, blood in stool, constipation, diarrhea, nausea and vomiting.  Endocrine: Negative.   Genitourinary: Negative.  Negative for difficulty urinating, dysuria, frequency, hematuria and urgency.  Musculoskeletal: Negative.  Negative for arthralgias, back pain, joint swelling, myalgias and neck pain.  Skin: Negative.  Negative for rash and wound.  Allergic/Immunologic: Negative.  Negative for immunocompromised state.  Neurological: Negative.  Negative for dizziness, seizures, numbness and headaches.  Hematological: Negative.   Psychiatric/Behavioral:  Negative for behavioral problems, self-injury and suicidal ideas. The patient is not nervous/anxious.     Vital Signs: BP 138/80   Pulse 79   Temp (!) 95.9 F (35.5 C)   Resp 16   Ht 5' 5.5 (1.664 m)   Wt 166 lb 3.2 oz (75.4 kg)   SpO2 98%   BMI 27.24 kg/m    Physical Exam Vitals reviewed.  Constitutional:      General: She is awake. She is not in acute distress.    Appearance: Normal appearance. She is well-developed, well-groomed and normal weight. She is not ill-appearing or diaphoretic.  HENT:  Head: Normocephalic and atraumatic.     Mouth/Throat:     Lips: Pink.     Pharynx: Uvula midline.  Eyes:     General: Lids are normal. Vision grossly intact. Gaze aligned appropriately.     Extraocular Movements: Extraocular movements intact.      Conjunctiva/sclera: Conjunctivae normal.     Pupils: Pupils are equal, round, and reactive to light.     Funduscopic exam:    Right eye: Red reflex present.        Left eye: Red reflex present. Neck:     Thyroid: No thyromegaly.     Vascular: No JVD.     Trachea: No tracheal deviation.  Cardiovascular:     Rate and Rhythm: Normal rate and regular rhythm.     Pulses: Normal pulses.     Heart sounds: Normal heart sounds, S1 normal and S2 normal. No murmur heard.    No friction rub. No gallop.  Pulmonary:     Effort: No accessory muscle usage or respiratory distress.     Breath sounds: Normal breath sounds and air entry. No stridor. No wheezing or rales.  Chest:     Chest wall: No tenderness.  Breasts:    Breasts are symmetrical.     Right: Normal. No swelling, bleeding, inverted nipple, mass, nipple discharge, skin change or tenderness.     Left: Normal. No swelling, bleeding, inverted nipple, mass, nipple discharge, skin change or tenderness.  Lymphadenopathy:     Upper Body:     Right upper body: No supraclavicular, axillary or pectoral adenopathy.     Left upper body: No supraclavicular, axillary or pectoral adenopathy.  Skin:    Capillary Refill: Capillary refill takes less than 2 seconds.  Neurological:     Mental Status: She is alert and oriented to person, place, and time.     Cranial Nerves: No cranial nerve deficit.     Motor: No abnormal muscle tone.     Coordination: Coordination normal.     Gait: Gait normal.     Deep Tendon Reflexes: Reflexes are normal and symmetric.  Psychiatric:        Mood and Affect: Mood and affect normal.        Behavior: Behavior normal. Behavior is cooperative.        Assessment/Plan: 1. Encounter for Medicare annual examination with abnormal findings (Primary) Age-appropriate preventive screenings and vaccinations discussed. Routine labs for health maintenance ordered, see below. PHM updated.   - CBC with  Differential/Platelet - CMP14+EGFR - Lipid Profile - Hgb A1C w/o eAG - TSH + free T4 - B12 and Folate Panel - Vitamin D  (25 hydroxy) - Rheumatoid Factor - Sed Rate (ESR) - C-reactive protein - ANA Direct w/Reflex if Positive - CYCLIC CITRUL PEPTIDE ANTIBODY, IGG/IGA - levocetirizine (XYZAL ) 5 MG tablet; Take 1 tablet (5 mg total) by mouth daily.  Dispense: 90 tablet; Refill: 1 - mupirocin  ointment (BACTROBAN ) 2 %; Apply 1 Application topically daily. To affected area until resolved.  Dispense: 30 g; Refill: 1  2. Essential hypertension Stable, continue lisinopril  as prescribed. Routine labs ordered.  - CBC with Differential/Platelet - CMP14+EGFR - Lipid Profile - Hgb A1C w/o eAG - TSH + free T4 - lisinopril  (ZESTRIL ) 10 MG tablet; Take 1 tablet (10 mg total) by mouth daily.  Dispense: 90 tablet; Refill: 1  3. Prediabetes Routine labs ordered  - CBC with Differential/Platelet - CMP14+EGFR - Lipid Profile - Hgb A1C w/o eAG - TSH + free T4  4. Mixed hyperlipidemia Routine labs ordered  - CBC with Differential/Platelet - CMP14+EGFR - Lipid Profile - Hgb A1C w/o eAG  5. Inflammatory polyarthropathy (HCC) Routine labs ordered  - Rheumatoid Factor - Sed Rate (ESR) - C-reactive protein - ANA Direct w/Reflex if Positive - CYCLIC CITRUL PEPTIDE ANTIBODY, IGG/IGA  6. Dupuytren's contracture of right hand Noted. No issues at this time. If pain worsens, will refer to orthopedic.   7. B12 deficiency Routine labs ordered  - CBC with Differential/Platelet - CMP14+EGFR - Lipid Profile - B12 and Folate Panel  8. Vitamin D  deficiency Routine labs ordered .  - CBC with Differential/Platelet - CMP14+EGFR - Lipid Profile - Hgb A1C w/o eAG - Vitamin D  (25 hydroxy) - buPROPion  (WELLBUTRIN  SR) 200 MG 12 hr tablet; Take 1 tablet (200 mg total) by mouth 2 (two) times daily.  Dispense: 180 tablet; Refill: 1  9. Statin myopathy Unable to tolerate statins due to side  effects  10. Dysuria Urine sent to lab, antibiotic prescribed.  - UA/M w/rflx Culture, Routine - doxycycline  (VIBRA -TABS) 100 MG tablet; Take 1 tablet (100 mg total) by mouth 2 (two) times daily for 7 days. On empty stomach  Dispense: 14 tablet; Refill: 0 - Microscopic Examination  11. Encounter for screening mammogram for malignant neoplasm of breast Routine mammogram ordered  - MM 3D SCREENING MAMMOGRAM BILATERAL BREAST; Future  12. Generalized anxiety disorder Continue prn alprazolam  as prescribed. Follow up in 3 months for additional refills. Continue other medications as prescribed.  - ALPRAZolam  (XANAX ) 0.5 MG tablet; TAKE ONE TAB IN MORNING for anxiety AND ONE AND HALF AT NIGHT FOR anxiety/sleep  Dispense: 75 tablet; Refill: 3 - desvenlafaxine  (PRISTIQ ) 100 MG 24 hr tablet; Take 1 tablet (100 mg total) by mouth daily.  Dispense: 90 tablet; Refill: 1     General Counseling: Jaanai verbalizes understanding of the findings of todays visit and agrees with plan of treatment. I have discussed any further diagnostic evaluation that may be needed or ordered today. We also reviewed her medications today. she has been encouraged to call the office with any questions or concerns that should arise related to todays visit.    Orders Placed This Encounter  Procedures   Microscopic Examination   MM 3D SCREENING MAMMOGRAM BILATERAL BREAST   UA/M w/rflx Culture, Routine   CBC with Differential/Platelet   CMP14+EGFR   Lipid Profile   Hgb A1C w/o eAG   TSH + free T4   B12 and Folate Panel   Vitamin D  (25 hydroxy)   Rheumatoid Factor   Sed Rate (ESR)   C-reactive protein   ANA Direct w/Reflex if Positive   CYCLIC CITRUL PEPTIDE ANTIBODY, IGG/IGA    Meds ordered this encounter  Medications   ALPRAZolam  (XANAX ) 0.5 MG tablet    Sig: TAKE ONE TAB IN MORNING for anxiety AND ONE AND HALF AT NIGHT FOR anxiety/sleep    Dispense:  75 tablet    Refill:  3    For future refills, keep on  file   lisinopril  (ZESTRIL ) 10 MG tablet    Sig: Take 1 tablet (10 mg total) by mouth daily.    Dispense:  90 tablet    Refill:  1    For future refills   buPROPion  (WELLBUTRIN  SR) 200 MG 12 hr tablet    Sig: Take 1 tablet (200 mg total) by mouth 2 (two) times daily.    Dispense:  180 tablet    Refill:  1    For  future refill   desvenlafaxine  (PRISTIQ ) 100 MG 24 hr tablet    Sig: Take 1 tablet (100 mg total) by mouth daily.    Dispense:  90 tablet    Refill:  1    Ok to fill as 90 day prescription, for future refills   levocetirizine (XYZAL ) 5 MG tablet    Sig: Take 1 tablet (5 mg total) by mouth daily.    Dispense:  90 tablet    Refill:  1    For future refill   mupirocin  ointment (BACTROBAN ) 2 %    Sig: Apply 1 Application topically daily. To affected area until resolved.    Dispense:  30 g    Refill:  1   doxycycline  (VIBRA -TABS) 100 MG tablet    Sig: Take 1 tablet (100 mg total) by mouth 2 (two) times daily for 7 days. On empty stomach    Dispense:  14 tablet    Refill:  0    Fill new script today    Return for F/U, Labs, Saleen Peden PCP in 3-4 weeks, and then need 3 month f/u for xanax  refills. .   Total time spent:30 Minutes Time spent includes review of chart, medications, test results, and follow up plan with the patient.   [place] Controlled Substance Database was reviewed by me.  This patient was seen by Mardy Maxin, FNP-C in collaboration with Dr. Sigrid Bathe as a part of collaborative care agreement.  Salena Ortlieb R. Maxin, MSN, FNP-C Internal medicine

## 2024-01-13 LAB — MICROSCOPIC EXAMINATION
Bacteria, UA: NONE SEEN
Casts: NONE SEEN /LPF
Epithelial Cells (non renal): NONE SEEN /HPF (ref 0–10)
RBC, Urine: NONE SEEN /HPF (ref 0–2)
WBC, UA: NONE SEEN /HPF (ref 0–5)

## 2024-01-13 LAB — UA/M W/RFLX CULTURE, ROUTINE
Bilirubin, UA: NEGATIVE
Glucose, UA: NEGATIVE
Ketones, UA: NEGATIVE
Leukocytes,UA: NEGATIVE
Nitrite, UA: NEGATIVE
Specific Gravity, UA: 1.017 (ref 1.005–1.030)
Urobilinogen, Ur: 0.2 mg/dL (ref 0.2–1.0)
pH, UA: 6 (ref 5.0–7.5)

## 2024-01-27 ENCOUNTER — Ambulatory Visit: Payer: Self-pay | Admitting: Nurse Practitioner

## 2024-01-27 NOTE — Progress Notes (Signed)
 Lab results reviewed, will discuss at upcoming office visit with patient.

## 2024-01-29 LAB — ANA W/REFLEX IF POSITIVE: Anti Nuclear Antibody (ANA): NEGATIVE

## 2024-01-29 LAB — CBC WITH DIFFERENTIAL/PLATELET
Basophils Absolute: 0 x10E3/uL (ref 0.0–0.2)
Basos: 0 %
EOS (ABSOLUTE): 0.1 x10E3/uL (ref 0.0–0.4)
Eos: 1 %
Hematocrit: 43.3 % (ref 34.0–46.6)
Hemoglobin: 14.4 g/dL (ref 11.1–15.9)
Immature Grans (Abs): 0 x10E3/uL (ref 0.0–0.1)
Immature Granulocytes: 0 %
Lymphocytes Absolute: 1.7 x10E3/uL (ref 0.7–3.1)
Lymphs: 24 %
MCH: 31.3 pg (ref 26.6–33.0)
MCHC: 33.3 g/dL (ref 31.5–35.7)
MCV: 94 fL (ref 79–97)
Monocytes Absolute: 0.6 x10E3/uL (ref 0.1–0.9)
Monocytes: 8 %
Neutrophils Absolute: 4.7 x10E3/uL (ref 1.4–7.0)
Neutrophils: 67 %
Platelets: 221 x10E3/uL (ref 150–450)
RBC: 4.6 x10E6/uL (ref 3.77–5.28)
RDW: 12.2 % (ref 11.7–15.4)
WBC: 7.1 x10E3/uL (ref 3.4–10.8)

## 2024-01-29 LAB — LIPID PANEL
Chol/HDL Ratio: 5.4 ratio — ABNORMAL HIGH (ref 0.0–4.4)
Cholesterol, Total: 242 mg/dL — ABNORMAL HIGH (ref 100–199)
HDL: 45 mg/dL
LDL Chol Calc (NIH): 176 mg/dL — ABNORMAL HIGH (ref 0–99)
Triglycerides: 115 mg/dL (ref 0–149)
VLDL Cholesterol Cal: 21 mg/dL (ref 5–40)

## 2024-01-29 LAB — VITAMIN D 25 HYDROXY (VIT D DEFICIENCY, FRACTURES): Vit D, 25-Hydroxy: 31.7 ng/mL (ref 30.0–100.0)

## 2024-01-29 LAB — CMP14+EGFR
ALT: 15 IU/L (ref 0–32)
AST: 12 IU/L (ref 0–40)
Albumin: 4.3 g/dL (ref 3.9–4.9)
Alkaline Phosphatase: 108 IU/L (ref 49–135)
BUN/Creatinine Ratio: 12 (ref 12–28)
BUN: 10 mg/dL (ref 8–27)
Bilirubin Total: 0.4 mg/dL (ref 0.0–1.2)
CO2: 24 mmol/L (ref 20–29)
Calcium: 9.4 mg/dL (ref 8.7–10.3)
Chloride: 102 mmol/L (ref 96–106)
Creatinine, Ser: 0.83 mg/dL (ref 0.57–1.00)
Globulin, Total: 2.3 g/dL (ref 1.5–4.5)
Glucose: 114 mg/dL — ABNORMAL HIGH (ref 70–99)
Potassium: 4.1 mmol/L (ref 3.5–5.2)
Sodium: 140 mmol/L (ref 134–144)
Total Protein: 6.6 g/dL (ref 6.0–8.5)
eGFR: 76 mL/min/1.73

## 2024-01-29 LAB — TSH+FREE T4
Free T4: 0.86 ng/dL (ref 0.82–1.77)
TSH: 2.59 u[IU]/mL (ref 0.450–4.500)

## 2024-01-29 LAB — RHEUMATOID FACTOR: Rheumatoid fact SerPl-aCnc: 10.5 [IU]/mL

## 2024-01-29 LAB — SEDIMENTATION RATE: Sed Rate: 5 mm/h (ref 0–40)

## 2024-01-29 LAB — HGB A1C W/O EAG: Hgb A1c MFr Bld: 5.9 % — ABNORMAL HIGH (ref 4.8–5.6)

## 2024-01-29 LAB — CYCLIC CITRUL PEPTIDE ANTIBODY, IGG/IGA: Cyclic Citrullin Peptide Ab: 15 U (ref 0–19)

## 2024-01-29 LAB — B12 AND FOLATE PANEL
Folate: 7.6 ng/mL
Vitamin B-12: 1301 pg/mL — ABNORMAL HIGH (ref 232–1245)

## 2024-01-29 LAB — C-REACTIVE PROTEIN: CRP: 5 mg/L (ref 0–10)

## 2024-01-31 NOTE — Progress Notes (Signed)
 Results have been reviewed by the provider and will be discussed in detail with the patient at her upcoming visit next week.

## 2024-02-09 ENCOUNTER — Ambulatory Visit (INDEPENDENT_AMBULATORY_CARE_PROVIDER_SITE_OTHER): Admitting: Nurse Practitioner

## 2024-02-09 ENCOUNTER — Encounter: Payer: Self-pay | Admitting: Nurse Practitioner

## 2024-02-09 VITALS — BP 138/75 | HR 76 | Temp 96.0°F | Resp 16 | Ht 65.5 in | Wt 165.0 lb

## 2024-02-09 DIAGNOSIS — E559 Vitamin D deficiency, unspecified: Secondary | ICD-10-CM | POA: Diagnosis not present

## 2024-02-09 DIAGNOSIS — M720 Palmar fascial fibromatosis [Dupuytren]: Secondary | ICD-10-CM | POA: Diagnosis not present

## 2024-02-09 DIAGNOSIS — I10 Essential (primary) hypertension: Secondary | ICD-10-CM

## 2024-02-09 DIAGNOSIS — T466X5A Adverse effect of antihyperlipidemic and antiarteriosclerotic drugs, initial encounter: Secondary | ICD-10-CM

## 2024-02-09 DIAGNOSIS — M064 Inflammatory polyarthropathy: Secondary | ICD-10-CM | POA: Diagnosis not present

## 2024-02-09 DIAGNOSIS — R7303 Prediabetes: Secondary | ICD-10-CM | POA: Diagnosis not present

## 2024-02-09 DIAGNOSIS — Z9189 Other specified personal risk factors, not elsewhere classified: Secondary | ICD-10-CM

## 2024-02-09 DIAGNOSIS — E782 Mixed hyperlipidemia: Secondary | ICD-10-CM | POA: Diagnosis not present

## 2024-02-09 DIAGNOSIS — G720 Drug-induced myopathy: Secondary | ICD-10-CM | POA: Diagnosis not present

## 2024-02-09 NOTE — Progress Notes (Signed)
 [facility] [address] [place], [place] [address]  Internal MEDICINE  Office Visit Note  Patient Name: Amanda Mcgee  [id number]  [id number]  Date of Service: 02/09/2024  Chief Complaint  Patient presents with   Depression   Hypertension   Hyperlipidemia   Follow-up    HPI Amanda Mcgee presents for a follow-up visit for lab results and BP.  All autoimmune labs are normal A1c improved to 5.9 B12, folate and vitamin D  are normal Thyroid lab was normal Fasting glucose was 114 CBC was normal.  Magnesium topical and oral supplement has been helping with her knee pain. She has not needed as much tylenol  or motrin.  BP home log -- BP readings are within goal range.  Having phone visit consult for colonoscopy tomorrow.  Blood pressure is elevated at visit today but rechecked manually. It improved when rechecked.   The 10-year ASCVD risk score (Arnett DK, et al., 2019) is: 23.4%   Values used to calculate the score:     Age: 69 years     Clinically relevant sex: Female     Is Non-Hispanic African American: No     Diabetic: No     Tobacco smoker: Yes     Systolic Blood Pressure: 138 mmHg     Is BP treated: Yes     HDL Cholesterol: 45 mg/dL     Total Cholesterol: 242 mg/dL   Current Medication: Outpatient Encounter Medications as of 02/09/2024  Medication Sig   albuterol  (VENTOLIN  HFA) 108 (90 Base) MCG/ACT inhaler Inhale 1-2 puffs into the lungs every 4 (four) hours as needed for wheezing or shortness of breath.   ALPRAZolam  (XANAX ) 0.5 MG tablet TAKE ONE TAB IN MORNING for anxiety AND ONE AND HALF AT NIGHT FOR anxiety/sleep   amLODipine  (NORVASC ) 5 MG tablet Take 1 tablet (5 mg total) by mouth daily.   buPROPion  (WELLBUTRIN  SR) 200 MG 12 hr tablet Take 1 tablet (200 mg total) by mouth 2 (two) times daily.   calcium  carbonate (OS-CAL) 600 MG TABS tablet Take 600 mg by mouth 2 (two) times daily with a meal.   cephALEXin  (KEFLEX ) 250 MG capsule Take 1 capsule (250 mg  total) by mouth daily.   Cholecalciferol (VITAMIN D3) 400 units CAPS Take by mouth daily.   CRANBERRY PO Take 1 tablet by mouth 2 (two) times daily.   desvenlafaxine  (PRISTIQ ) 100 MG 24 hr tablet Take 1 tablet (100 mg total) by mouth daily.   docusate sodium  (COLACE) 100 MG capsule Take 100 mg by mouth 2 (two) times daily. prn   fluticasone  (FLONASE ) 50 MCG/ACT nasal spray Place 1 spray into both nostrils daily.   levocetirizine (XYZAL ) 5 MG tablet Take 1 tablet (5 mg total) by mouth daily.   lisinopril  (ZESTRIL ) 10 MG tablet Take 1 tablet (10 mg total) by mouth daily.   meclizine  (ANTIVERT ) 25 MG tablet TAKE 1 TABLET BY MOUTH TWICE A DAY AS NEEDED FOR DIZZINESS   mupirocin  ointment (BACTROBAN ) 2 % Apply 1 Application topically daily. To affected area until resolved.   Vibegron  (GEMTESA ) 75 MG TABS Take 1 tablet (75 mg total) by mouth daily.   No facility-administered encounter medications on file as of 02/09/2024.    Surgical History: Past Surgical History:  Procedure Laterality Date   ABDOMINAL HYSTERECTOMY  1990   APPENDECTOMY  1969   BLADDER SURGERY  2011   BREAST BIOPSY Left    benign   COLONOSCOPY WITH PROPOFOL  N/A 02/01/2018   Procedure:  COLONOSCOPY WITH PROPOFOL ;  Surgeon: Therisa Bi, MD;  Location: [facility] ENDOSCOPY;  Service: Gastroenterology;  Laterality: N/A;   LITHOTRIPSY  06/2011    Medical History: Past Medical History:  Diagnosis Date   Actinic keratosis    Anxiety    Arthritis    Depression    HTN (hypertension)    Hyperlipidemia    Kidney stones 2013    Family History: Family History  Problem Relation Age of Onset   Breast cancer Mother 72   Heart attack Son     Social History   Socioeconomic History   Marital status: Married    Spouse name: Not on file   Number of children: Not on file   Years of education: Not on file   Highest education level: Not on file  Occupational History   Not on file  Tobacco Use   Smoking status: Every Day    Current  packs/day: 0.50    Average packs/day: 0.5 packs/day for 30.0 years (15.0 ttl pk-yrs)    Types: Cigarettes    Passive exposure: Current   Smokeless tobacco: Never   Tobacco comments:    1/2 pack daily  Vaping Use   Vaping status: Never Used  Substance and Sexual Activity   Alcohol use: Yes    Comment: occassionally   Drug use: No   Sexual activity: Never  Other Topics Concern   Not on file  Social History Narrative   Not on file   Social Drivers of Health   Tobacco Use: High Risk (02/09/2024)   Patient History    Smoking Tobacco Use: Every Day    Smokeless Tobacco Use: Never    Passive Exposure: Current  Financial Resource Strain: Not on file  Food Insecurity: Not on file  Transportation Needs: Not on file  Physical Activity: Not on file  Stress: Not on file  Social Connections: Not on file  Intimate Partner Violence: Not on file  Depression (PHQ2-9): Low Risk (01/11/2023)   Depression (PHQ2-9)    PHQ-2 Score: 0  Alcohol Screen: Low Risk (01/06/2022)   Alcohol Screen    Last Alcohol Screening Score (AUDIT): 1  Housing: Unknown (11/26/2023)   Received from [facility]   Epic    Unable to Pay for Housing in the Last Year: Not on file    Number of Times Moved in the Last Year: Not on file    At any time in the past 12 months, were you homeless or living in a shelter (including now)?: No  Utilities: Not on file  Health Literacy: Not on file      Review of Systems  Constitutional:  Negative for chills, fatigue and unexpected weight change.  HENT:  Negative for congestion, rhinorrhea, sneezing and sore throat.   Eyes:  Negative for redness.  Respiratory: Negative.  Negative for cough, chest tightness, shortness of breath and wheezing.   Cardiovascular: Negative.  Negative for chest pain and palpitations.  Gastrointestinal:  Negative for abdominal pain, constipation, diarrhea, nausea and vomiting.  Genitourinary:  Negative for dysuria and frequency.   Musculoskeletal: Negative.  Negative for arthralgias, back pain, joint swelling and neck pain.  Skin:  Negative for rash.  Neurological: Negative.  Negative for tremors and numbness.  Hematological:  Negative for adenopathy. Does not bruise/bleed easily.  Psychiatric/Behavioral:  Positive for depression. Negative for behavioral problems (Depression), self-injury, sleep disturbance and suicidal ideas. The patient is nervous/anxious.     Vital Signs: BP 138/75 Comment: 150/94  Pulse 76  Temp (!) 96 F (35.6 C)   Resp 16   Ht 5' 5.5 (1.664 m)   Wt 165 lb (74.8 kg)   SpO2 98%   BMI 27.04 kg/m    Physical Exam Vitals reviewed.  Constitutional:      General: She is not in acute distress.    Appearance: Normal appearance. She is not ill-appearing.  HENT:     Head: Normocephalic and atraumatic.  Eyes:     Pupils: Pupils are equal, round, and reactive to light.  Cardiovascular:     Rate and Rhythm: Normal rate and regular rhythm.  Pulmonary:     Effort: Pulmonary effort is normal. No respiratory distress.  Skin:    Capillary Refill: Capillary refill takes less than 2 seconds.  Neurological:     Mental Status: She is alert and oriented to person, place, and time.  Psychiatric:        Mood and Affect: Mood normal.        Behavior: Behavior normal.        Assessment/Plan: 1. Essential hypertension (Primary) Stable, continue amlodipine  and lisinopril  as prescribed.  2. Prediabetes Stable, continue low sugar low carb diet and increase physical activity as tolerated.  3. Mixed hyperlipidemia Continue low cholesterol low fat diet and increase physical activity as tolerated.   4. Vitamin D  deficiency Continue OTC vitamin D  supplement  5. Statin myopathy Not currently on statin therapy due to not tolerating side effectives.   6. Inflammatory polyarthropathy (HCC) Continue prn tylenol  or motrin if desired.   7. Dupuytren's contracture of right hand Continue prn  tylenol  or motrin if desired.   8. At high risk for cardiovascular disease CT cardiac calcium  score test ordered.  - CT CARDIAC SCORING (SELF PAY ONLY); Future   General Counseling: gwendolen hewlett understanding of the findings of todays visit and agrees with plan of treatment. I have discussed any further diagnostic evaluation that may be needed or ordered today. We also reviewed her medications today. she has been encouraged to call the office with any questions or concerns that should arise related to todays visit.    Orders Placed This Encounter  Procedures   CT CARDIAC SCORING (SELF PAY ONLY)    No orders of the defined types were placed in this encounter.   Return in about 1 month (around 03/11/2024) for F/U, [facility] PCP for cardiac calcium  CT scoring test results. .   Total time spent:30 Minutes Time spent includes review of chart, medications, test results, and follow up plan with the patient.   [place] Controlled Substance Database was reviewed by me.  This patient was seen by Mardy Maxin, FNP-C in collaboration with Dr. Sigrid Bathe as a part of collaborative care agreement.   Nicolo Tomko R. Maxin, MSN, FNP-C Internal medicine

## 2024-03-29 ENCOUNTER — Ambulatory Visit: Admit: 2024-03-29 | Admitting: Gastroenterology

## 2024-03-31 ENCOUNTER — Encounter

## 2024-04-06 ENCOUNTER — Ambulatory Visit: Admitting: Nurse Practitioner

## 2024-08-01 ENCOUNTER — Ambulatory Visit: Admitting: Dermatology

## 2025-01-15 ENCOUNTER — Ambulatory Visit: Admitting: Nurse Practitioner
